# Patient Record
Sex: Female | Born: 1970
Health system: Southern US, Community
[De-identification: ages and names within clinical notes are randomized; demographics above are authoritative.]

## PROBLEM LIST (undated history)

## (undated) DIAGNOSIS — D509 Iron deficiency anemia, unspecified: Secondary | ICD-10-CM

## (undated) DIAGNOSIS — R7303 Prediabetes: Secondary | ICD-10-CM

## (undated) DIAGNOSIS — F419 Anxiety disorder, unspecified: Secondary | ICD-10-CM

## (undated) DIAGNOSIS — I639 Cerebral infarction, unspecified: Secondary | ICD-10-CM

## (undated) DIAGNOSIS — T7840XA Allergy, unspecified, initial encounter: Secondary | ICD-10-CM

## (undated) DIAGNOSIS — R519 Headache, unspecified: Secondary | ICD-10-CM

## (undated) DIAGNOSIS — K8020 Calculus of gallbladder without cholecystitis without obstruction: Secondary | ICD-10-CM

## (undated) DIAGNOSIS — J301 Allergic rhinitis due to pollen: Secondary | ICD-10-CM

## (undated) DIAGNOSIS — E785 Hyperlipidemia, unspecified: Secondary | ICD-10-CM

## (undated) HISTORY — PX: BREAST BIOPSY: SHX20

## (undated) HISTORY — DX: Cerebral infarction, unspecified: I63.9

## (undated) HISTORY — DX: Calculus of gallbladder without cholecystitis without obstruction: K80.20

## (undated) HISTORY — DX: Allergy, unspecified, initial encounter: T78.40XA

## (undated) HISTORY — PX: BRAIN SURGERY: SHX531

## (undated) HISTORY — DX: Anxiety disorder, unspecified: F41.9

## (undated) HISTORY — DX: Hyperlipidemia, unspecified: E78.5

## (undated) HISTORY — DX: Prediabetes: R73.03

## (undated) HISTORY — DX: Iron deficiency anemia, unspecified: D50.9

## (undated) HISTORY — PX: ABDOMINAL HYSTERECTOMY: SHX81

---

## 2003-04-20 ENCOUNTER — Emergency Department (HOSPITAL_COMMUNITY): Admission: EM | Admit: 2003-04-20 | Discharge: 2003-04-20 | Payer: Self-pay | Admitting: Emergency Medicine

## 2005-07-06 ENCOUNTER — Emergency Department (HOSPITAL_COMMUNITY): Admission: EM | Admit: 2005-07-06 | Discharge: 2005-07-06 | Payer: Self-pay | Admitting: Emergency Medicine

## 2008-02-26 ENCOUNTER — Emergency Department (HOSPITAL_COMMUNITY): Admission: EM | Admit: 2008-02-26 | Discharge: 2008-02-26 | Payer: Self-pay | Admitting: Emergency Medicine

## 2009-08-07 ENCOUNTER — Emergency Department (HOSPITAL_COMMUNITY): Admission: EM | Admit: 2009-08-07 | Discharge: 2009-08-07 | Payer: Self-pay | Admitting: Family Medicine

## 2010-04-15 ENCOUNTER — Other Ambulatory Visit
Admission: RE | Admit: 2010-04-15 | Discharge: 2010-04-15 | Payer: Self-pay | Source: Home / Self Care | Admitting: Obstetrics and Gynecology

## 2010-05-15 ENCOUNTER — Encounter: Admission: RE | Admit: 2010-05-15 | Discharge: 2010-05-15 | Payer: Self-pay | Admitting: Obstetrics and Gynecology

## 2010-06-03 ENCOUNTER — Encounter: Admission: RE | Admit: 2010-06-03 | Discharge: 2010-06-03 | Payer: Self-pay | Admitting: Obstetrics and Gynecology

## 2012-04-11 ENCOUNTER — Other Ambulatory Visit: Payer: Self-pay | Admitting: Obstetrics and Gynecology

## 2012-04-11 ENCOUNTER — Other Ambulatory Visit (HOSPITAL_COMMUNITY)
Admission: RE | Admit: 2012-04-11 | Discharge: 2012-04-11 | Disposition: A | Discharge status: Home / Self Care | Payer: Managed Care, Other (non HMO) | Source: Ambulatory Visit | Attending: Obstetrics and Gynecology | Admitting: Obstetrics and Gynecology

## 2012-04-11 DIAGNOSIS — Z01419 Encounter for gynecological examination (general) (routine) without abnormal findings: Secondary | ICD-10-CM | POA: Insufficient documentation

## 2012-04-11 DIAGNOSIS — N760 Acute vaginitis: Secondary | ICD-10-CM | POA: Insufficient documentation

## 2012-04-11 DIAGNOSIS — Z113 Encounter for screening for infections with a predominantly sexual mode of transmission: Secondary | ICD-10-CM | POA: Insufficient documentation

## 2012-11-02 ENCOUNTER — Other Ambulatory Visit: Payer: Self-pay | Admitting: Obstetrics and Gynecology

## 2012-11-02 ENCOUNTER — Other Ambulatory Visit (HOSPITAL_COMMUNITY)
Admission: RE | Admit: 2012-11-02 | Discharge: 2012-11-02 | Disposition: A | Discharge status: Home / Self Care | Payer: Managed Care, Other (non HMO) | Source: Ambulatory Visit | Attending: Obstetrics and Gynecology | Admitting: Obstetrics and Gynecology

## 2012-11-02 DIAGNOSIS — Z01419 Encounter for gynecological examination (general) (routine) without abnormal findings: Secondary | ICD-10-CM | POA: Insufficient documentation

## 2015-01-13 ENCOUNTER — Other Ambulatory Visit (HOSPITAL_COMMUNITY)
Admission: RE | Admit: 2015-01-13 | Discharge: 2015-01-13 | Disposition: A | Discharge status: Home / Self Care | Payer: Managed Care, Other (non HMO) | Source: Ambulatory Visit | Attending: Obstetrics and Gynecology | Admitting: Obstetrics and Gynecology

## 2015-01-13 ENCOUNTER — Other Ambulatory Visit: Payer: Self-pay

## 2015-01-13 ENCOUNTER — Other Ambulatory Visit: Payer: Self-pay | Admitting: Obstetrics and Gynecology

## 2015-01-13 DIAGNOSIS — Z01419 Encounter for gynecological examination (general) (routine) without abnormal findings: Secondary | ICD-10-CM | POA: Insufficient documentation

## 2015-01-13 DIAGNOSIS — Z1231 Encounter for screening mammogram for malignant neoplasm of breast: Secondary | ICD-10-CM

## 2015-01-13 DIAGNOSIS — Z1151 Encounter for screening for human papillomavirus (HPV): Secondary | ICD-10-CM | POA: Diagnosis present

## 2015-01-17 LAB — CYTOLOGY - PAP

## 2015-01-21 ENCOUNTER — Ambulatory Visit: Payer: Managed Care, Other (non HMO)

## 2015-01-30 ENCOUNTER — Ambulatory Visit
Admission: RE | Admit: 2015-01-30 | Discharge: 2015-01-30 | Disposition: A | Discharge status: Home / Self Care | Payer: Managed Care, Other (non HMO) | Source: Ambulatory Visit

## 2015-01-30 DIAGNOSIS — Z1231 Encounter for screening mammogram for malignant neoplasm of breast: Secondary | ICD-10-CM

## 2015-01-31 ENCOUNTER — Other Ambulatory Visit: Payer: Self-pay | Admitting: Obstetrics and Gynecology

## 2015-01-31 DIAGNOSIS — R928 Other abnormal and inconclusive findings on diagnostic imaging of breast: Secondary | ICD-10-CM

## 2015-02-04 ENCOUNTER — Ambulatory Visit
Admission: RE | Admit: 2015-02-04 | Discharge: 2015-02-04 | Disposition: A | Discharge status: Home / Self Care | Payer: Managed Care, Other (non HMO) | Source: Ambulatory Visit | Attending: Obstetrics and Gynecology | Admitting: Obstetrics and Gynecology

## 2015-02-04 DIAGNOSIS — R928 Other abnormal and inconclusive findings on diagnostic imaging of breast: Secondary | ICD-10-CM

## 2016-08-19 ENCOUNTER — Encounter (HOSPITAL_COMMUNITY): Payer: Self-pay | Admitting: Emergency Medicine

## 2016-08-19 ENCOUNTER — Emergency Department (HOSPITAL_COMMUNITY)
Admission: EM | Admit: 2016-08-19 | Discharge: 2016-08-19 | Disposition: A | Discharge status: Home / Self Care | Payer: Managed Care, Other (non HMO) | Attending: Emergency Medicine | Admitting: Emergency Medicine

## 2016-08-19 DIAGNOSIS — B9789 Other viral agents as the cause of diseases classified elsewhere: Secondary | ICD-10-CM

## 2016-08-19 DIAGNOSIS — R05 Cough: Secondary | ICD-10-CM | POA: Diagnosis present

## 2016-08-19 DIAGNOSIS — J069 Acute upper respiratory infection, unspecified: Secondary | ICD-10-CM | POA: Insufficient documentation

## 2016-08-19 MED ORDER — ALBUTEROL SULFATE HFA 108 (90 BASE) MCG/ACT IN AERS
2.0000 | INHALATION_SPRAY | Freq: Once | RESPIRATORY_TRACT | Status: AC
  Administered 2016-08-19: 2 via RESPIRATORY_TRACT
  Filled 2016-08-19: qty 6.7

## 2016-08-19 MED ORDER — BENZONATATE 100 MG PO CAPS: 100.0000 mg | ORAL_CAPSULE | Freq: Three times a day (TID) | ORAL | 0 refills | Status: DC

## 2016-08-19 MED ORDER — OSELTAMIVIR PHOSPHATE 75 MG PO CAPS
75.0000 mg | ORAL_CAPSULE | Freq: Two times a day (BID) | ORAL | 0 refills | Status: DC
Start: 2016-08-19 — End: 2019-09-22

## 2016-08-19 NOTE — ED Provider Notes (Signed)
[facility] DEPT Provider Note   CSN: [id number] Arrival date & time: 08/19/16  [id number]     History   Chief Complaint Chief Complaint  Patient presents with  . Cough  . Sore Throat    HPI Renee Hahn is a 46 y.o. female.  HPI Renee Hahn is a 46 y.o. female, otherwise healthy, presents emergency department complaining of flulike symptoms. Patient's symptoms started yesterday. She is complaining of body aches, cough, congestion, sore throat, headache. Denies any earache, no neck pain or stiffness, nausea or vomiting. Husband is sick with the same.  Subjective fever, did not take temperatue. Patient has taken Motrin for her fever. She has not taken anything else at home. Did not get flu shot this year. States nothing is making her symptoms better or worse. She has no pain at this time. States could not sleep last night so came to the emergency department.   There are no active problems to display for this patient.   History reviewed. No pertinent surgical history.  OB History    No data available       Home Medications    Prior to Admission medications   Medication Sig Start Date End Date Taking? Authorizing Provider  ibuprofen (ADVIL,MOTRIN) 200 MG tablet Take 400 mg by mouth every 6 (six) hours as needed for moderate pain.   Yes Historical Provider, MD  benzonatate (TESSALON) 100 MG capsule Take 1 capsule (100 mg total) by mouth every 8 (eight) hours. 08/19/16   Dearies Meikle, PA-C  oseltamivir (TAMIFLU) 75 MG capsule Take 1 capsule (75 mg total) by mouth every 12 (twelve) hours. 08/19/16   Jeannett Senior, PA-C    Family History Family History  Problem Relation Age of Onset  . Cancer Other     Social History Social History  Substance Use Topics  . Smoking status: Never Smoker  . Smokeless tobacco: Never Used  . Alcohol use No     Allergies   Patient has no known allergies.   Review of Systems Review of Systems  Constitutional:  Positive for chills and fever.  HENT: Positive for congestion and sore throat.   Respiratory: Positive for shortness of breath. Negative for cough and chest tightness.   Cardiovascular: Negative for chest pain, palpitations and leg swelling.  Gastrointestinal: Negative for abdominal pain, diarrhea, nausea and vomiting.  Genitourinary: Negative for dysuria, flank pain and pelvic pain.  Musculoskeletal: Positive for myalgias. Negative for arthralgias, neck pain and neck stiffness.  Skin: Negative for rash.  Neurological: Negative for dizziness, weakness and headaches.  All other systems reviewed and are negative.    Physical Exam Updated Vital Signs BP 127/88 (BP Location: Left Arm)   Pulse 108   Temp 98.4 F (36.9 C) (Oral)   Resp 20   Ht 4\' 11"  (1.499 m)   Wt 130.9 kg   LMP 08/07/2016 (Exact Date)   SpO2 96%   BMI 58.29 kg/m   Physical Exam  Constitutional: She is oriented to person, place, and time. She appears well-developed and well-nourished. No distress.  HENT:  Head: Normocephalic and atraumatic.  Right Ear: Tympanic membrane, external ear and ear canal normal.  Left Ear: Tympanic membrane, external ear and ear canal normal.  Nose: Mucosal edema and rhinorrhea present.  Mouth/Throat: Uvula is midline and mucous membranes are normal. Posterior oropharyngeal erythema present. No oropharyngeal exudate, posterior oropharyngeal edema or tonsillar abscesses.  Eyes: Conjunctivae are normal.  Neck: Neck supple.  Cardiovascular: Normal rate, regular rhythm, normal  heart sounds and intact distal pulses.   Pulmonary/Chest: Effort normal and breath sounds normal. No respiratory distress. She has no wheezes. She has no rales.  Abdominal: Soft. Bowel sounds are normal. She exhibits no distension. There is no tenderness. There is no rebound.  Musculoskeletal: Normal range of motion. She exhibits no edema.  Neurological: She is alert and oriented to person, place, and time.  Skin:  Skin is warm and dry.  Psychiatric: She has a normal mood and affect. Her behavior is normal.  Nursing note and vitals reviewed.    ED Treatments / Results  Labs (all labs ordered are listed, but only abnormal results are displayed) Labs Reviewed - No data to display  EKG  EKG Interpretation None       Radiology No results found.  Procedures Procedures (including critical care time)  Medications Ordered in ED Medications  albuterol (PROVENTIL HFA;VENTOLIN HFA) 108 (90 Base) MCG/ACT inhaler 2 puff (not administered)     Initial Impression / Assessment and Plan / ED Course  I have reviewed the triage vital signs and the nursing notes.  Pertinent labs & imaging results that were available during my care of the patient were reviewed by me and considered in my medical decision making (see chart for details).    Pt in ED with flul like symptoms. Husband sick with the same. She is afebrile here, mildly tachycardic, non toxic appearing. Lungs clear. Pt reports some wheezing earlier at home. Will give an inhaler. Home with symptomatic treatment, including tessalon, tyelnol/motrin. Pt asking about tamiflu, explained pros and cons for the tamiflu, she asked for prescription, I will provide. Return precautions discussed.    Vitals:   08/19/16 0442  BP: 127/88  Pulse: 108  Resp: 20  Temp: 98.4 F (36.9 C)  TempSrc: Oral  SpO2: 96%  Weight: 130.9 kg  Height: 4\' 11"  (1.499 m)     Final Clinical Impressions(s) / ED Diagnoses   Final diagnoses:  Viral URI with cough    New Prescriptions New Prescriptions   BENZONATATE (TESSALON) 100 MG CAPSULE    Take 1 capsule (100 mg total) by mouth every 8 (eight) hours.   OSELTAMIVIR (TAMIFLU) 75 MG CAPSULE    Take 1 capsule (75 mg total) by mouth every 12 (twelve) hours.     Jeannett Senior, PA-C 08/19/16 [place], MD 08/19/16 [phone]

## 2016-08-19 NOTE — Discharge Instructions (Signed)
Take ibuprofen/tylenol for body aches, pain, fever. Drink plenty of fluids to stay hydrated. Rest. Take inhaler 2 puffs every 4 hrs for wheezing and cough. Tessalon perles for cough. Follow up with family doctor in 3-4 days if not improving. Return if worsening. Take tamiflu if chose to.

## 2016-08-19 NOTE — ED Triage Notes (Signed)
Pt states her husband has flu like symptoms since Monday and on Wednesday she started having cough, congestion, scratchy throat, and chills  Pt denies N/V/D

## 2017-12-04 DIAGNOSIS — M25561 Pain in right knee: Secondary | ICD-10-CM | POA: Diagnosis not present

## 2019-09-20 ENCOUNTER — Other Ambulatory Visit: Payer: Self-pay

## 2019-09-20 DIAGNOSIS — D509 Iron deficiency anemia, unspecified: Secondary | ICD-10-CM | POA: Diagnosis not present

## 2019-09-20 DIAGNOSIS — K810 Acute cholecystitis: Secondary | ICD-10-CM | POA: Diagnosis not present

## 2019-09-20 DIAGNOSIS — K8012 Calculus of gallbladder with acute and chronic cholecystitis without obstruction: Principal | ICD-10-CM | POA: Insufficient documentation

## 2019-09-20 DIAGNOSIS — Z03818 Encounter for observation for suspected exposure to other biological agents ruled out: Secondary | ICD-10-CM | POA: Diagnosis not present

## 2019-09-20 DIAGNOSIS — Z20822 Contact with and (suspected) exposure to covid-19: Secondary | ICD-10-CM | POA: Diagnosis not present

## 2019-09-20 DIAGNOSIS — K819 Cholecystitis, unspecified: Secondary | ICD-10-CM | POA: Diagnosis present

## 2019-09-20 DIAGNOSIS — Z6841 Body Mass Index (BMI) 40.0 and over, adult: Secondary | ICD-10-CM | POA: Diagnosis not present

## 2019-09-20 DIAGNOSIS — K8020 Calculus of gallbladder without cholecystitis without obstruction: Secondary | ICD-10-CM | POA: Diagnosis not present

## 2019-09-21 ENCOUNTER — Encounter (HOSPITAL_COMMUNITY): Payer: Self-pay

## 2019-09-21 ENCOUNTER — Observation Stay (HOSPITAL_COMMUNITY)
Admission: EM | Admit: 2019-09-21 | Discharge: 2019-09-22 | Disposition: A | Discharge status: Home / Self Care | Payer: No Typology Code available for payment source | Attending: Surgery | Admitting: Surgery

## 2019-09-21 ENCOUNTER — Observation Stay (HOSPITAL_COMMUNITY): Payer: No Typology Code available for payment source | Admitting: Certified Registered"

## 2019-09-21 ENCOUNTER — Other Ambulatory Visit: Payer: Self-pay

## 2019-09-21 ENCOUNTER — Emergency Department (HOSPITAL_COMMUNITY): Payer: No Typology Code available for payment source

## 2019-09-21 ENCOUNTER — Encounter (HOSPITAL_COMMUNITY)
Admission: EM | Disposition: A | Discharge status: Home / Self Care | Payer: Self-pay | Source: Home / Self Care | Attending: Emergency Medicine

## 2019-09-21 DIAGNOSIS — Z03818 Encounter for observation for suspected exposure to other biological agents ruled out: Secondary | ICD-10-CM | POA: Diagnosis not present

## 2019-09-21 DIAGNOSIS — K819 Cholecystitis, unspecified: Secondary | ICD-10-CM | POA: Diagnosis present

## 2019-09-21 DIAGNOSIS — K8000 Calculus of gallbladder with acute cholecystitis without obstruction: Secondary | ICD-10-CM | POA: Diagnosis not present

## 2019-09-21 DIAGNOSIS — D509 Iron deficiency anemia, unspecified: Secondary | ICD-10-CM

## 2019-09-21 DIAGNOSIS — K8012 Calculus of gallbladder with acute and chronic cholecystitis without obstruction: Secondary | ICD-10-CM | POA: Diagnosis not present

## 2019-09-21 DIAGNOSIS — K810 Acute cholecystitis: Secondary | ICD-10-CM | POA: Diagnosis not present

## 2019-09-21 DIAGNOSIS — Z9049 Acquired absence of other specified parts of digestive tract: Secondary | ICD-10-CM | POA: Diagnosis present

## 2019-09-21 DIAGNOSIS — K8066 Calculus of gallbladder and bile duct with acute and chronic cholecystitis without obstruction: Secondary | ICD-10-CM | POA: Diagnosis not present

## 2019-09-21 DIAGNOSIS — K8020 Calculus of gallbladder without cholecystitis without obstruction: Secondary | ICD-10-CM | POA: Diagnosis not present

## 2019-09-21 HISTORY — PX: CHOLECYSTECTOMY: SHX55

## 2019-09-21 HISTORY — DX: Allergic rhinitis due to pollen: J30.1

## 2019-09-21 LAB — URINALYSIS, ROUTINE W REFLEX MICROSCOPIC
Bilirubin Urine: NEGATIVE
Glucose, UA: NEGATIVE mg/dL
Ketones, ur: NEGATIVE mg/dL
Leukocytes,Ua: NEGATIVE
Nitrite: NEGATIVE
Protein, ur: NEGATIVE mg/dL
Specific Gravity, Urine: 1.013 (ref 1.005–1.030)
pH: 8 (ref 5.0–8.0)

## 2019-09-21 LAB — RESPIRATORY PANEL BY RT PCR (FLU A&B, COVID)
Influenza A by PCR: NEGATIVE
Influenza B by PCR: NEGATIVE
SARS Coronavirus 2 by RT PCR: NEGATIVE

## 2019-09-21 LAB — COMPREHENSIVE METABOLIC PANEL
ALT: 13 U/L (ref 0–44)
AST: 35 U/L (ref 15–41)
Albumin: 3.5 g/dL (ref 3.5–5.0)
Alkaline Phosphatase: 71 U/L (ref 38–126)
Anion gap: 9 (ref 5–15)
BUN: 10 mg/dL (ref 6–20)
CO2: 27 mmol/L (ref 22–32)
Calcium: 8.8 mg/dL — ABNORMAL LOW (ref 8.9–10.3)
Chloride: 104 mmol/L (ref 98–111)
Creatinine, Ser: 0.82 mg/dL (ref 0.44–1.00)
GFR calc Af Amer: >60 mL/min (ref >60)
GFR calc non Af Amer: >60 mL/min (ref >60)
Glucose, Bld: 109 mg/dL — ABNORMAL HIGH (ref 70–99)
Potassium: 4 mmol/L (ref 3.5–5.1)
Sodium: 140 mmol/L (ref 135–145)
Total Bilirubin: 0.6 mg/dL (ref 0.3–1.2)
Total Protein: 7.5 g/dL (ref 6.5–8.1)

## 2019-09-21 LAB — CBC
HCT: 29.3 % — ABNORMAL LOW (ref 36.0–46.0)
Hemoglobin: 7.6 g/dL — ABNORMAL LOW (ref 12.0–15.0)
MCH: 15.6 pg — ABNORMAL LOW (ref 26.0–34.0)
MCHC: 25.9 g/dL — ABNORMAL LOW (ref 30.0–36.0)
MCV: 60 fL — ABNORMAL LOW (ref 80.0–100.0)
Platelets: 514 10*3/uL — ABNORMAL HIGH (ref 150–400)
RBC: 4.88 MIL/uL (ref 3.87–5.11)
RDW: 21.7 % — ABNORMAL HIGH (ref 11.5–15.5)
WBC: 15.7 10*3/uL — ABNORMAL HIGH (ref 4.0–10.5)
nRBC: 0 % (ref 0.0–0.2)

## 2019-09-21 LAB — PREGNANCY, URINE: Preg Test, Ur: NEGATIVE

## 2019-09-21 LAB — LIPASE, BLOOD: Lipase: 29 U/L (ref 11–51)

## 2019-09-21 SURGERY — LAPAROSCOPIC CHOLECYSTECTOMY WITH INTRAOPERATIVE CHOLANGIOGRAM
Anesthesia: General

## 2019-09-21 MED ORDER — OXYCODONE HCL 5 MG/5ML PO SOLN: 5.0000 mg | Freq: Once | ORAL | Status: DC | PRN

## 2019-09-21 MED ORDER — PHENYLEPHRINE 40 MCG/ML (10ML) SYRINGE FOR IV PUSH (FOR BLOOD PRESSURE SUPPORT)
PREFILLED_SYRINGE | INTRAVENOUS | Status: AC
  Filled 2019-09-21: qty 10

## 2019-09-21 MED ORDER — SODIUM CHLORIDE 0.9 % IV BOLUS
1000.0000 mL | Freq: Once | INTRAVENOUS | Status: AC
Start: 2019-09-21 — End: 2019-09-21
  Administered 2019-09-21: 02:00:00 1000 mL via INTRAVENOUS

## 2019-09-21 MED ORDER — LIDOCAINE 2% (20 MG/ML) 5 ML SYRINGE
INTRAMUSCULAR | Status: AC
  Filled 2019-09-21: qty 5

## 2019-09-21 MED ORDER — MIDAZOLAM HCL 2 MG/2ML IJ SOLN
INTRAMUSCULAR | Status: AC
  Filled 2019-09-21: qty 2

## 2019-09-21 MED ORDER — ONDANSETRON 4 MG PO TBDP
4.0000 mg | ORAL_TABLET | Freq: Once | ORAL | Status: AC | PRN
  Administered 2019-09-21: 4 mg via ORAL
  Filled 2019-09-21: qty 1

## 2019-09-21 MED ORDER — SODIUM CHLORIDE 0.9 % IV SOLN
2.0000 g | Freq: Once | INTRAVENOUS | Status: AC
  Administered 2019-09-21: 2 g via INTRAVENOUS
  Filled 2019-09-21: qty 20

## 2019-09-21 MED ORDER — ONDANSETRON HCL 4 MG/2ML IJ SOLN
INTRAMUSCULAR | Status: DC | PRN
  Administered 2019-09-21: 4 mg via INTRAVENOUS

## 2019-09-21 MED ORDER — LACTATED RINGERS IV SOLN: INTRAVENOUS | Status: DC

## 2019-09-21 MED ORDER — OXYCODONE HCL 5 MG PO TABS
5.0000 mg | ORAL_TABLET | ORAL | Status: DC | PRN
  Administered 2019-09-21: 5 mg via ORAL
  Filled 2019-09-21: qty 1

## 2019-09-21 MED ORDER — SUGAMMADEX SODIUM 500 MG/5ML IV SOLN
INTRAVENOUS | Status: DC | PRN
  Administered 2019-09-21: 250 mg via INTRAVENOUS

## 2019-09-21 MED ORDER — METOCLOPRAMIDE HCL 5 MG/ML IJ SOLN
10.0000 mg | Freq: Once | INTRAMUSCULAR | Status: AC
  Administered 2019-09-21: 10 mg via INTRAVENOUS
  Filled 2019-09-21: qty 2

## 2019-09-21 MED ORDER — ACETAMINOPHEN 325 MG PO TABS: 650.0000 mg | ORAL_TABLET | Freq: Four times a day (QID) | ORAL | Status: DC | PRN

## 2019-09-21 MED ORDER — HYDROMORPHONE HCL 1 MG/ML IJ SOLN: 1.0000 mg | INTRAMUSCULAR | Status: DC | PRN

## 2019-09-21 MED ORDER — SODIUM CHLORIDE 0.9 % IV SOLN: INTRAVENOUS | Status: DC

## 2019-09-21 MED ORDER — SODIUM CHLORIDE 0.9% FLUSH: 3.0000 mL | Freq: Once | INTRAVENOUS | Status: DC

## 2019-09-21 MED ORDER — SUCCINYLCHOLINE CHLORIDE 200 MG/10ML IV SOSY
PREFILLED_SYRINGE | INTRAVENOUS | Status: DC | PRN
  Administered 2019-09-21: 160 mg via INTRAVENOUS

## 2019-09-21 MED ORDER — FENTANYL CITRATE (PF) 100 MCG/2ML IJ SOLN
50.0000 ug | Freq: Once | INTRAMUSCULAR | Status: AC
  Administered 2019-09-21: 50 ug via INTRAVENOUS
  Filled 2019-09-21: qty 2

## 2019-09-21 MED ORDER — LIDOCAINE 2% (20 MG/ML) 5 ML SYRINGE
INTRAMUSCULAR | Status: DC | PRN
  Administered 2019-09-21: 40 mg via INTRAVENOUS
  Administered 2019-09-21: 60 mg via INTRAVENOUS

## 2019-09-21 MED ORDER — FENTANYL CITRATE (PF) 100 MCG/2ML IJ SOLN
INTRAMUSCULAR | Status: AC
  Filled 2019-09-21: qty 2

## 2019-09-21 MED ORDER — PROPOFOL 10 MG/ML IV BOLUS
INTRAVENOUS | Status: AC
  Filled 2019-09-21: qty 20

## 2019-09-21 MED ORDER — ONDANSETRON HCL 4 MG/2ML IJ SOLN: 4.0000 mg | Freq: Four times a day (QID) | INTRAMUSCULAR | Status: DC | PRN

## 2019-09-21 MED ORDER — MIDAZOLAM HCL 2 MG/2ML IJ SOLN
INTRAMUSCULAR | Status: DC | PRN
  Administered 2019-09-21: 2 mg via INTRAVENOUS

## 2019-09-21 MED ORDER — SUGAMMADEX SODIUM 500 MG/5ML IV SOLN
INTRAVENOUS | Status: AC
  Filled 2019-09-21: qty 5

## 2019-09-21 MED ORDER — FENTANYL CITRATE (PF) 100 MCG/2ML IJ SOLN: 25.0000 ug | INTRAMUSCULAR | Status: DC | PRN

## 2019-09-21 MED ORDER — ONDANSETRON HCL 4 MG/2ML IJ SOLN
INTRAMUSCULAR | Status: AC
  Filled 2019-09-21: qty 2

## 2019-09-21 MED ORDER — ACETAMINOPHEN 650 MG RE SUPP: 650.0000 mg | Freq: Four times a day (QID) | RECTAL | Status: DC | PRN

## 2019-09-21 MED ORDER — LACTATED RINGERS IV SOLN
INTRAVENOUS | Status: AC | PRN
  Administered 2019-09-21: 1000 mL

## 2019-09-21 MED ORDER — MENTHOL 3 MG MT LOZG
1.0000 | LOZENGE | OROMUCOSAL | Status: DC | PRN
  Administered 2019-09-21: 3 mg via ORAL
  Filled 2019-09-21: qty 9

## 2019-09-21 MED ORDER — BUPIVACAINE-EPINEPHRINE 0.25% -1:200000 IJ SOLN
INTRAMUSCULAR | Status: DC | PRN
  Administered 2019-09-21: 10 mL

## 2019-09-21 MED ORDER — SUCCINYLCHOLINE CHLORIDE 200 MG/10ML IV SOSY
PREFILLED_SYRINGE | INTRAVENOUS | Status: AC
  Filled 2019-09-21: qty 10

## 2019-09-21 MED ORDER — DEXAMETHASONE SODIUM PHOSPHATE 10 MG/ML IJ SOLN
INTRAMUSCULAR | Status: AC
  Filled 2019-09-21: qty 1

## 2019-09-21 MED ORDER — PROMETHAZINE HCL 25 MG/ML IJ SOLN: 6.2500 mg | INTRAMUSCULAR | Status: DC | PRN

## 2019-09-21 MED ORDER — SODIUM CHLORIDE 0.9 % IV SOLN
2.0000 g | INTRAVENOUS | Status: DC
  Administered 2019-09-21: 2 g via INTRAVENOUS
  Filled 2019-09-21 (×2): qty 20

## 2019-09-21 MED ORDER — ONDANSETRON 4 MG PO TBDP: 4.0000 mg | ORAL_TABLET | Freq: Four times a day (QID) | ORAL | Status: DC | PRN

## 2019-09-21 MED ORDER — PROPOFOL 10 MG/ML IV BOLUS
INTRAVENOUS | Status: DC | PRN
  Administered 2019-09-21: 200 mg via INTRAVENOUS

## 2019-09-21 MED ORDER — SCOPOLAMINE 1 MG/3DAYS TD PT72
1.0000 | MEDICATED_PATCH | TRANSDERMAL | Status: DC
  Administered 2019-09-21: 1.5 mg via TRANSDERMAL
  Filled 2019-09-21: qty 1

## 2019-09-21 MED ORDER — ACETAMINOPHEN 500 MG PO TABS: 1000.0000 mg | ORAL_TABLET | Freq: Once | ORAL | Status: DC

## 2019-09-21 MED ORDER — OXYCODONE HCL 5 MG PO TABS: 5.0000 mg | ORAL_TABLET | Freq: Once | ORAL | Status: DC | PRN

## 2019-09-21 MED ORDER — BUPIVACAINE HCL 0.25 % IJ SOLN
INTRAMUSCULAR | Status: AC
  Filled 2019-09-21: qty 1

## 2019-09-21 MED ORDER — FENTANYL CITRATE (PF) 100 MCG/2ML IJ SOLN
INTRAMUSCULAR | Status: DC | PRN
  Administered 2019-09-21 (×4): 50 ug via INTRAVENOUS

## 2019-09-21 MED ORDER — ROCURONIUM BROMIDE 10 MG/ML (PF) SYRINGE
PREFILLED_SYRINGE | INTRAVENOUS | Status: DC | PRN
  Administered 2019-09-21 (×2): 20 mg via INTRAVENOUS

## 2019-09-21 MED ORDER — PHENYLEPHRINE 40 MCG/ML (10ML) SYRINGE FOR IV PUSH (FOR BLOOD PRESSURE SUPPORT)
PREFILLED_SYRINGE | INTRAVENOUS | Status: DC | PRN
  Administered 2019-09-21: 120 ug via INTRAVENOUS

## 2019-09-21 MED ORDER — ENOXAPARIN SODIUM 40 MG/0.4ML ~~LOC~~ SOLN: 40.0000 mg | SUBCUTANEOUS | Status: DC

## 2019-09-21 MED ORDER — DEXAMETHASONE SODIUM PHOSPHATE 10 MG/ML IJ SOLN
INTRAMUSCULAR | Status: DC | PRN
  Administered 2019-09-21: 8 mg via INTRAVENOUS

## 2019-09-21 MED ORDER — ROCURONIUM BROMIDE 10 MG/ML (PF) SYRINGE
PREFILLED_SYRINGE | INTRAVENOUS | Status: AC
  Filled 2019-09-21: qty 10

## 2019-09-21 SURGICAL SUPPLY — 39 items
ADH SKN CLS APL DERMABOND .7 (GAUZE/BANDAGES/DRESSINGS) ×1
APL PRP STRL LF DISP 70% ISPRP (MISCELLANEOUS) ×1
APPLIER CLIP ROT 10 11.4 M/L (STAPLE) ×2
APR CLP MED LRG 11.4X10 (STAPLE) ×1
BAG SPEC RTRVL LRG 6X4 10 (ENDOMECHANICALS) ×1
CHLORAPREP W/TINT 26 (MISCELLANEOUS) ×2 IMPLANT
CLIP APPLIE ROT 10 11.4 M/L (STAPLE) ×1 IMPLANT
COVER MAYO STAND STRL (DRAPES) ×2 IMPLANT
COVER WAND RF STERILE (DRAPES) IMPLANT
DECANTER SPIKE VIAL GLASS SM (MISCELLANEOUS) ×2 IMPLANT
DERMABOND ADVANCED (GAUZE/BANDAGES/DRESSINGS) ×1
DERMABOND ADVANCED .7 DNX12 (GAUZE/BANDAGES/DRESSINGS) IMPLANT
DRAPE C-ARM 42X120 X-RAY (DRAPES) ×2 IMPLANT
DRAPE UTILITY XL STRL (DRAPES) ×2 IMPLANT
DRAPE WARM FLUID 44X44 (DRAPES) ×1 IMPLANT
ELECT REM PT RETURN 15FT ADLT (MISCELLANEOUS) ×2 IMPLANT
GLOVE INDICATOR 8.0 STRL GRN (GLOVE) ×2 IMPLANT
GLOVE SS BIOGEL STRL SZ 7.5 (GLOVE) ×1 IMPLANT
GLOVE SUPERSENSE BIOGEL SZ 7.5 (GLOVE) ×1
GOWN STRL REUS W/TWL XL LVL3 (GOWN DISPOSABLE) ×4 IMPLANT
HEMOSTAT SURGICEL 4X8 (HEMOSTASIS) IMPLANT
KIT BASIN OR (CUSTOM PROCEDURE TRAY) ×2 IMPLANT
KIT TURNOVER KIT A (KITS) IMPLANT
PENCIL SMOKE EVACUATOR (MISCELLANEOUS) IMPLANT
POUCH SPECIMEN RETRIEVAL 10MM (ENDOMECHANICALS) ×2 IMPLANT
PROTECTOR NERVE ULNAR (MISCELLANEOUS) IMPLANT
SCISSORS LAP 5X35 DISP (ENDOMECHANICALS) IMPLANT
SET CHOLANGIOGRAPH MIX (MISCELLANEOUS) ×1 IMPLANT
SET IRRIG TUBING LAPAROSCOPIC (IRRIGATION / IRRIGATOR) ×2 IMPLANT
SET TUBE SMOKE EVAC HIGH FLOW (TUBING) IMPLANT
SLEEVE XCEL OPT CAN 5 100 (ENDOMECHANICALS) ×2 IMPLANT
SUT MNCRL AB 4-0 PS2 18 (SUTURE) ×2 IMPLANT
TAPE CLOTH 4X10 WHT NS (GAUZE/BANDAGES/DRESSINGS) IMPLANT
TOWEL OR 17X26 10 PK STRL BLUE (TOWEL DISPOSABLE) ×2 IMPLANT
TOWEL OR NON WOVEN STRL DISP B (DISPOSABLE) ×2 IMPLANT
TRAY LAPAROSCOPIC (CUSTOM PROCEDURE TRAY) ×2 IMPLANT
TROCAR BLADELESS OPT 5 100 (ENDOMECHANICALS) ×2 IMPLANT
TROCAR XCEL BLUNT TIP 100MML (ENDOMECHANICALS) ×2 IMPLANT
TROCAR XCEL NON-BLD 11X100MML (ENDOMECHANICALS) ×2 IMPLANT

## 2019-09-21 NOTE — Progress Notes (Signed)
Report received from [facility] RN/ED and pt arrived to 1332 and ambulated to bed. Oriented to call bell for needs/safety and nothing to eat/drink until decision for surgery is made.

## 2019-09-21 NOTE — Transfer of Care (Signed)
Immediate Anesthesia Transfer of Care Note  Patient: Renee Hahn  Procedure(s) Performed: LAPAROSCOPIC CHOLECYSTECTOMY WITH INTRAOPERATIVE CHOLANGIOGRAM (N/A )  Patient Location: PACU  Anesthesia Type:General  Level of Consciousness: awake, alert  and oriented  Airway & Oxygen Therapy: Patient Spontanous Breathing and Patient connected to face mask oxygen  Post-op Assessment: Report given to RN and Post -op Vital signs reviewed and stable  Post vital signs: Reviewed and stable  Last Vitals:  Vitals Value Taken Time  BP    Temp    Pulse 107 09/21/19 1719  Resp 28 09/21/19 1719  SpO2 93 % 09/21/19 1719  Vitals shown include unvalidated device data.  Last Pain:  Vitals:   09/21/19 1428  TempSrc: Oral  PainSc:       Patients Stated Pain Goal: 1 ([id number])  Complications: No apparent anesthesia complications

## 2019-09-21 NOTE — Anesthesia Preprocedure Evaluation (Addendum)
Anesthesia Evaluation  Patient identified by MRN, date of birth, ID band Patient awake    Reviewed: Allergy & Precautions, NPO status , Patient's Chart, lab work & pertinent test results  History of Anesthesia Complications Negative for: history of anesthetic complications  Airway Mallampati: II  TM Distance: >3 FB Neck ROM: Full    Dental no notable dental hx.    Pulmonary neg pulmonary ROS,    Pulmonary exam normal        Cardiovascular negative cardio ROS Normal cardiovascular exam     Neuro/Psych negative neurological ROS     GI/Hepatic Neg liver ROS, acute cholecystitis/cholelithiasis   Endo/Other  Morbid obesity  Renal/GU negative Renal ROS     Musculoskeletal negative musculoskeletal ROS (+)   Abdominal   Peds  Hematology negative hematology ROS (+)   Anesthesia Other Findings Day of surgery medications reviewed with the patient.  Reproductive/Obstetrics                           Anesthesia Physical Anesthesia Plan  ASA: III  Anesthesia Plan: General   Post-op Pain Management:    Induction: Intravenous  PONV Risk Score and Plan: 4 or greater and Ondansetron, Dexamethasone, Scopolamine patch - Pre-op and Midazolam  Airway Management Planned: Oral ETT  Additional Equipment: None  Intra-op Plan:   Post-operative Plan: Extubation in OR  Informed Consent: I have reviewed the patients History and Physical, chart, labs and discussed the procedure including the risks, benefits and alternatives for the proposed anesthesia with the patient or authorized representative who has indicated his/her understanding and acceptance.     Dental advisory given  Plan Discussed with: CRNA  Anesthesia Plan Comments:       Anesthesia Quick Evaluation

## 2019-09-21 NOTE — Plan of Care (Signed)
  Problem: Pain Managment: Goal: General experience of comfort will improve Outcome: Progressing   Problem: Safety: Goal: Ability to remain free from injury will improve Outcome: Progressing   

## 2019-09-21 NOTE — ED Notes (Signed)
Pt lying in bed. Assisted up to the bathroom. Denies any needs. Will continue to monitor.

## 2019-09-21 NOTE — ED Notes (Signed)
Pt up walking to the bathroom. NAD noted. Pt denies any needs. Will continue to monitor.

## 2019-09-21 NOTE — Op Note (Signed)
Laparoscopic Cholecystectomy  Procedure Note  Indications: This patient presents with symptomatic gallbladder disease/acute cholecystitis and will undergo laparoscopic cholecystectomy.The procedure has been discussed with the patient. Operative and non operative treatments have been discussed. Risks of surgery include bleeding, infection,  Common bile duct injury,  Injury to the stomach,liver, colon,small intestine, abdominal wall,  Diaphragm,  Major blood vessels,  And the need for an open procedure.  Other risks include worsening of medical problems, death,  DVT and pulmonary embolism, and cardiovascular events.   Medical options have also been discussed. The patient has been informed of long term expectations of surgery and non surgical options,  The patient agrees to proceed.    Pre-operative Diagnosis: Calculus of gallbladder with acute cholecystitis, without mention of obstruction  Post-operative Diagnosis: Calculus of gallbladder with acute cholecystitis, without mention of obstruction  Surgeon: Turner Daniels MD   Assistants: Rise Mu   Anesthesia: General endotracheal anesthesia and Local anesthesia 0.25.% bupivacaine  ASA Class: 2  Procedure Details  The patient was seen again in the Holding Room. The risks, benefits, complications, treatment options, and expected outcomes were discussed with the patient. The possibilities of reaction to medication, pulmonary aspiration, perforation of viscus, bleeding, recurrent infection, finding a normal gallbladder, the need for additional procedures, failure to diagnose a condition, the possible need to convert to an open procedure, and creating a complication requiring transfusion or operation were discussed with the patient. The patient and/or family concurred with the proposed plan, giving informed consent. The site of surgery properly noted/marked. The patient was taken to Operating Room, identified as Renee Hahn and the procedure  verified as Laparoscopic Cholecystectomy with Intraoperative Cholangiograms. A Time Out was held and the above information confirmed.  Prior to the induction of general anesthesia, antibiotic prophylaxis was administered. General endotracheal anesthesia was then administered and tolerated well. After the induction, the abdomen was prepped in the usual sterile fashion. The patient was positioned in the supine position with the left arm comfortably tucked, along with some reverse Trendelenburg.  Local anesthetic agent was injected into the skin near the umbilicus and an incision made. The midline fascia was incised and the Hasson technique was used to introduce a 12 mm port under direct vision. It was secured with a figure of eight Vicryl suture placed in the usual fashion. Pneumoperitoneum was then created with CO2 and tolerated well without any adverse changes in the patient's vital signs. Additional trocars were introduced under direct vision with an 11 mm trocar in the epigastrium and 2 5 mm trocars in the right upper quadrant. All skin incisions were infiltrated with a local anesthetic agent before making the incision and placing the trocars.   The gallbladder was identified, the fundus grasped and retracted cephalad. Adhesions were lysed bluntly and with the electrocautery where indicated, taking care not to injure any adjacent organs or viscus. The infundibulum was grasped and retracted laterally, exposing the peritoneum overlying the triangle of Calot. This was then divided and exposed in a blunt fashion. The cystic duct was clearly identified and bluntly dissected circumferentially. The junctions of the gallbladder, cystic duct and common bile duct were clearly identified prior to the division of any linear structure.     The cystic duct was extremely small and she had no CBD dilation or abnormal LFT'S.  The cystic duct was then  ligated with surgical clips  on the patient side and  clipped on the  gallbladder side and divided. The cystic artery was identified,  dissected free, ligated with clips and divided as well. Posterior cystic artery clipped and divided.  The gallbladder was dissected from the liver bed in retrograde fashion with the electrocautery. The gallbladder was removed. The liver bed was irrigated and inspected. Hemostasis was achieved with the electrocautery. Copious irrigation was utilized and was repeatedly aspirated until clear all particulate matter. Hemostasis was achieved with no signs  Of bleeding or bile leakage.  Pneumoperitoneum was completely reduced after viewing removal of the trocars under direct vision. The wound was thoroughly irrigated and the fascia was then closed with a figure of eight suture; the skin was then closed with 4 - 0 monocryl  and a sterile dressing was applied.  Instrument, sponge, and needle counts were correct at closure and at the conclusion of the case.   Findings: Cholecystitis with Cholelithiasis  Estimated Blood Loss: Minimal         Drains: none          Total IV Fluids: per record          Specimens: Gallbladder           Complications: None; patient tolerated the procedure well.         Disposition: PACU - hemodynamically stable.         Condition: stable

## 2019-09-21 NOTE — ED Notes (Signed)
Pt lying in bed. [name] at bedside. Pt denies any needs. Monitor placed on pt. Will continue to monitor.

## 2019-09-21 NOTE — Anesthesia Procedure Notes (Signed)
Procedure Name: Intubation Date/Time: 09/21/2019 3:49 PM Performed by: Niel Hummer, CRNA Pre-anesthesia Checklist: Patient identified, Emergency Drugs available, Suction available and Patient being monitored Patient Re-evaluated:Patient Re-evaluated prior to induction Oxygen Delivery Method: Circle system utilized Preoxygenation: Pre-oxygenation with 100% oxygen Induction Type: IV induction Ventilation: Mask ventilation without difficulty Laryngoscope Size: Mac and 4 Grade View: Grade I Tube type: Oral Tube size: 7.5 mm Number of attempts: 1 Airway Equipment and Method: Stylet Placement Confirmation: ETT inserted through vocal cords under direct vision,  positive ETCO2 and breath sounds checked- equal and bilateral Secured at: 22 cm Tube secured with: Tape Dental Injury: Teeth and Oropharynx as per pre-operative assessment

## 2019-09-21 NOTE — Anesthesia Postprocedure Evaluation (Signed)
Anesthesia Post Note  Patient: Renee Hahn  Procedure(s) Performed: LAPAROSCOPIC CHOLECYSTECTOMY WITH INTRAOPERATIVE CHOLANGIOGRAM (N/A )     Patient location during evaluation: PACU Anesthesia Type: General Level of consciousness: awake and alert Pain management: pain level controlled Vital Signs Assessment: post-procedure vital signs reviewed and stable Respiratory status: spontaneous breathing, nonlabored ventilation and respiratory function stable Cardiovascular status: blood pressure returned to baseline and stable Postop Assessment: no apparent nausea or vomiting Anesthetic complications: no    Last Vitals:  Vitals:   09/21/19 1745 09/21/19 1800  BP: (!) 154/104 (!) 149/102  Pulse:  76  Resp: 16 10  Temp: 36.7 C   SpO2: 100% 100%    Last Pain:  Vitals:   09/21/19 1745  TempSrc:   PainSc: 0-No pain                 Lidia Collum

## 2019-09-21 NOTE — H&P (Signed)
[facility] Admission Note  Renee Hahn Nov 05, 1970  [id number].    Requesting MD: Antonietta Breach PA-C Chief Complaint: Abdominal pain right upper quadrant with nausea and vomiting Reason for Consult: Acute cholecystitis  HPI:  Patient is a 49 year old female who presented to the ED early this a.m. complaining of abdominal pain that she initially felt was indigestion around 11 AM yesterday..  She took a gas pill around 3 PM which did not improve her symptoms.  The pain became worse in the right upper quadrant followed by some nausea and vomiting.  She had some increased stooling but no diarrhea and presented to the ED early this a.m. because of ongoing pain.  Work-up in the ED shows she was afebrile somewhat hypertensive on admission.  Labs shows CMP essentially normal except for a glucose of 109 calcium of 8.8.  Lipase was 29 AST 35, ALT 13, total bilirubin 0.6. CBC shows white count of 15.7, hemoglobin of 7.6, hematocrit of 29.3, platelets 514,000.  Urinalysis is negative, Covid is negative.  Abdominal ultrasound shows multiple gallstones including a 2.5 cm stone at the neck of the gallbladder.  Positive Murphy sign.  CBD was 4 mm.  Findings were consistent with acute cholecystitis/cholelithiasis.  Dr. Donne Hazel was contacted and is admitted her for laparoscopic cholecystectomy.    ROS: Review of Systems  Constitutional: Negative.   HENT: Negative.   Eyes: Negative.   Respiratory: Negative.   Cardiovascular: Negative.   Gastrointestinal: Positive for abdominal pain, nausea and vomiting. Negative for blood in stool, constipation, diarrhea, heartburn and melena.  Genitourinary: Negative.   Musculoskeletal: Negative.   Skin: Negative.   Neurological: Negative.   Endo/Heme/Allergies: Negative.        She did mention that she has heavy periods and they are becoming more irregular.  Psychiatric/Behavioral: Negative.     Family History  Problem Relation Age of Onset  .  Cancer Other     Past Medical History:  Diagnosis Date  . Hay fever     History reviewed. No pertinent surgical history.  Social History:  reports that she has never smoked. She has never used smokeless tobacco. She reports current alcohol use. She reports that she does not use drugs.  Allergies: No Known Allergies  Medications Prior to Admission  Medication Sig Dispense Refill  . ibuprofen (ADVIL,MOTRIN) 200 MG tablet Take 400 mg by mouth every 6 (six) hours as needed for moderate pain.    . benzonatate (TESSALON) 100 MG capsule Take 1 capsule (100 mg total) by mouth every 8 (eight) hours. (Patient not taking: Reported on 09/21/2019) 21 capsule 0  . oseltamivir (TAMIFLU) 75 MG capsule Take 1 capsule (75 mg total) by mouth every 12 (twelve) hours. (Patient not taking: Reported on 09/21/2019) 10 capsule 0    Blood pressure 117/76, pulse 65, temperature 98.7 F (37.1 C), temperature source Oral, resp. rate 16, height 4\' 11"  (1.499 m), weight 124.7 kg, last menstrual period 09/15/2019, SpO2 100 %. Physical Exam:  General: pleasant, obese black female in no acute distress. HEENT: head is normocephalic, atraumatic.  Sclera are noninjected.  Pupils are equal.  Ears and nose without any masses or lesions.  Mouth is pink and moist Heart: regular, rate, and rhythm.  Normal s1,s2.  Aortic murmur, gallops, or rubs noted.  Palpable radial and pedal pulses bilaterally Lungs: CTAB, no wheezes, rhonchi, or rales noted.  Respiratory effort nonlabored Abd: soft, she is tender over the right upper lateral quadrant , ND, +BS, no masses,  hernias, or organomegaly MS: all 4 extremities are symmetrical with no cyanosis, clubbing, or edema. Skin: warm and dry with no masses, lesions, or rashes Neuro: Cranial nerves 2-12 grossly intact, sensation is normal throughout Psych: A&Ox3 with an appropriate affect.   Results for orders placed or performed during the hospital encounter of 09/21/19 (from the past 48  hour(s))  Urinalysis, Routine w reflex microscopic     Status: Abnormal   Collection Time: 09/21/19 12:23 AM  Result Value Ref Range   Color, Urine YELLOW YELLOW   APPearance CLEAR CLEAR   Specific Gravity, Urine 1.013 1.005 - 1.030   pH 8.0 5.0 - 8.0   Glucose, UA NEGATIVE NEGATIVE mg/dL   Hgb urine dipstick MODERATE (A) NEGATIVE   Bilirubin Urine NEGATIVE NEGATIVE   Ketones, ur NEGATIVE NEGATIVE mg/dL   Protein, ur NEGATIVE NEGATIVE mg/dL   Nitrite NEGATIVE NEGATIVE   Leukocytes,Ua NEGATIVE NEGATIVE   RBC / HPF 11-20 0 - 5 RBC/hpf   WBC, UA 0-5 0 - 5 WBC/hpf   Bacteria, UA RARE (A) NONE SEEN   Squamous Epithelial / LPF 0-5 0 - 5   Mucus PRESENT     Comment: Performed at [facility], [place] [address] [place], [place] [address]  Lipase, blood     Status: None   Collection Time: 09/21/19  2:00 AM  Result Value Ref Range   Lipase 29 11 - 51 U/L    Comment: Performed at [facility], [place] [address], [place], [place] [address]  Comprehensive metabolic panel     Status: Abnormal   Collection Time: 09/21/19  2:00 AM  Result Value Ref Range   Sodium 140 135 - 145 mmol/L   Potassium 4.0 3.5 - 5.1 mmol/L   Chloride 104 98 - 111 mmol/L   CO2 27 22 - 32 mmol/L   Glucose, Bld 109 (H) 70 - 99 mg/dL    Comment: Glucose reference range applies only to samples taken after fasting for at least 8 hours.   BUN 10 6 - 20 mg/dL   Creatinine, Ser 0.82 0.44 - 1.00 mg/dL   Calcium 8.8 (L) 8.9 - 10.3 mg/dL   Total Protein 7.5 6.5 - 8.1 g/dL   Albumin 3.5 3.5 - 5.0 g/dL   AST 35 15 - 41 U/L   ALT 13 0 - 44 U/L   Alkaline Phosphatase 71 38 - 126 U/L   Total Bilirubin 0.6 0.3 - 1.2 mg/dL   GFR calc non Af Amer >60 >60 mL/min   GFR calc Af Amer >60 >60 mL/min   Anion gap 9 5 - 15    Comment: Performed at [facility], [place] [address], [place],  [address]  CBC     Status: Abnormal   Collection Time: 09/21/19  2:00 AM  Result Value Ref  Range   WBC 15.7 (H) 4.0 - 10.5 K/uL   RBC 4.88 3.87 - 5.11 MIL/uL   Hemoglobin 7.6 (L) 12.0 - 15.0 g/dL    Comment: Reticulocyte Hemoglobin testing may be clinically indicated, consider ordering this additional test [id number]    HCT 29.3 (L) 36.0 - 46.0 %   MCV 60.0 (L) 80.0 - 100.0 fL   MCH 15.6 (L) 26.0 - 34.0 pg   MCHC 25.9 (L) 30.0 - 36.0 g/dL   RDW 21.7 (H) 11.5 - 15.5 %   Platelets 514 (H) 150 - 400 K/uL   nRBC 0.0 0.0 - 0.2 %    Comment:  Performed at [facility], [place] [address] [place], [place] [address]  Respiratory Panel by RT PCR (Flu A&B, Covid) - Urine, Clean Catch     Status: None   Collection Time: 09/21/19  2:55 AM   Specimen: Urine, Clean Catch  Result Value Ref Range   SARS Coronavirus 2 by RT PCR NEGATIVE NEGATIVE    Comment: (NOTE) SARS-CoV-2 target nucleic acids are NOT DETECTED. The SARS-CoV-2 RNA is generally detectable in upper respiratoy specimens during the acute phase of infection. The lowest concentration of SARS-CoV-2 viral copies this assay can detect is 131 copies/mL. A negative result does not preclude SARS-Cov-2 infection and should not be used as the sole basis for treatment or other patient management decisions. A negative result may occur with  improper specimen collection/handling, submission of specimen other than nasopharyngeal swab, presence of viral mutation(s) within the areas targeted by this assay, and inadequate number of viral copies (<131 copies/mL). A negative result must be combined with clinical observations, patient history, and epidemiological information. The expected result is Negative. Fact Sheet for Patients:  PinkCheek.be Fact Sheet for Healthcare Providers:  GravelBags.it This test is not yet ap proved or cleared by the [place] FDA and  has been authorized for detection and/or diagnosis of SARS-CoV-2 by FDA under an Emergency Use  Authorization (EUA). This EUA will remain  in effect (meaning this test can be used) for the duration of the COVID-19 declaration under Section 564(b)(1) of the Act, 21 U.S.C. section 360bbb-3(b)(1), unless the authorization is terminated or revoked sooner.    Influenza A by PCR NEGATIVE NEGATIVE   Influenza B by PCR NEGATIVE NEGATIVE    Comment: (NOTE) The Xpert Xpress SARS-CoV-2/FLU/RSV assay is intended as an aid in  the diagnosis of influenza from Nasopharyngeal swab specimens and  should not be used as a sole basis for treatment. Nasal washings and  aspirates are unacceptable for Xpert Xpress SARS-CoV-2/FLU/RSV  testing. Fact Sheet for Patients: PinkCheek.be Fact Sheet for Healthcare Providers: GravelBags.it This test is not yet approved or cleared by the [place] FDA and  has been authorized for detection and/or diagnosis of SARS-CoV-2 by  FDA under an Emergency Use Authorization (EUA). This EUA will remain  in effect (meaning this test can be used) for the duration of the  Covid-19 declaration under Section 564(b)(1) of the Act, 21  U.S.C. section 360bbb-3(b)(1), unless the authorization is  terminated or revoked. Performed at [facility], [place] [address], [place], [place] [address]    US Abdomen Limited  Result Date: 09/21/2019 CLINICAL DATA:  Right upper quadrant pain EXAM: ULTRASOUND ABDOMEN LIMITED RIGHT UPPER QUADRANT COMPARISON:  None. FINDINGS: Gallbladder: There are multiple gallstones, including a 2.5 cm stone at the gallbladder neck. A positive sonographic Percell Miller sign was reported by the sonographer. Common bile duct: Diameter: 4 mm Liver: Coarse, mildly hyperechoic hepatic echotexture. Portal vein is patent on color Doppler imaging with normal direction of blood flow towards the liver. Other: None. IMPRESSION: Cholelithiasis with 2.5 cm stone at the gallbladder neck and positive  sonographic Murphy sign, compatible with acute cholecystitis in the appropriate context. Electronically Signed   By: Ulyses Jarred M.D.   On: 09/21/2019 02:32      Assessment/Plan Obesity BMI 55.5 Anemia  Acute cholecystitis/cholelithiasis.  Plan: Admit, IV hydration, antibiotics, plan laparoscopic cholecystectomy later today.  Risk and benefits were discussed in detail, questions were answered.     Earnstine Regal [facility] 09/21/2019, 7:14 AM Please  see Amion for pager number during day hours 7:00am-4:30pm

## 2019-09-21 NOTE — Interval H&P Note (Signed)
History and Physical Interval Note:  09/21/2019 3:11 PM  Renee Hahn  has presented today for surgery, with the diagnosis of acute cholecystitis/cholelithiasis.  The various methods of treatment have been discussed with the patient and family. After consideration of risks, benefits and other options for treatment, the patient has consented to  Procedure(s): LAPAROSCOPIC CHOLECYSTECTOMY WITH INTRAOPERATIVE CHOLANGIOGRAM (N/A) as a surgical intervention.  The patient's history has been reviewed, patient examined, no change in status, stable for surgery.  I have reviewed the patient's chart and labs.  Questions were answered to the patient's satisfaction.     Emmet

## 2019-09-21 NOTE — Progress Notes (Signed)
Patient ID: Renee Hahn, female   DOB: 1970-10-20, 49 y.o.   MRN: [id number] with cholecystitis on [name], elevated wbc, nl lfts, will admit with abx, plan for lap chole.

## 2019-09-21 NOTE — ED Provider Notes (Addendum)
[facility] DEPT Provider Note   CSN: [id number] Arrival date & time: 09/20/19  2350     History Chief Complaint  Patient presents with  . Abdominal Pain    right abdominal pain    Renee Hahn is a 49 y.o. female.  49 y/o female presents to the emergency department for complaints of abdominal pain.  She states that she initially felt as though she was experiencing indigestion earlier in the day.  Took a gas pill and has since been experiencing worsening pain in her RUQ and R side with waxing/waning nausea.  Pain does not radiate.  She has vomited ~6-7 times today.  Emesis yellow and watery in color; nonbloody.  Patient did have some increased stooling as well, but no diarrhea, melena, hematochezia.  Denies fevers, urinary symptoms.  No hx of abdominal surgeries.  The history is provided by the patient. No language interpreter was used.  Abdominal Pain      Past Medical History:  Diagnosis Date  . Hay fever     Patient Active Problem List   Diagnosis Date Noted  . Cholecystitis 09/21/2019    History reviewed. No pertinent surgical history.   OB History   No obstetric history on file.     Family History  Problem Relation Age of Onset  . Cancer Other     Social History   Tobacco Use  . Smoking status: Never Smoker  . Smokeless tobacco: Never Used  Substance Use Topics  . Alcohol use: Yes  . Drug use: No    Home Medications Prior to Admission medications   Medication Sig Start Date End Date Taking? Authorizing Provider  ibuprofen (ADVIL,MOTRIN) 200 MG tablet Take 400 mg by mouth every 6 (six) hours as needed for moderate pain.   Yes [provider]  benzonatate (TESSALON) 100 MG capsule Take 1 capsule (100 mg total) by mouth every 8 (eight) hours. Patient not taking: Reported on 09/21/2019 08/19/16   Jeannett Senior, PA-C  oseltamivir (TAMIFLU) 75 MG capsule Take 1 capsule (75 mg total) by mouth every 12  (twelve) hours. Patient not taking: Reported on 09/21/2019 08/19/16   Jeannett Senior, PA-C    Allergies    Patient has no known allergies.  Review of Systems   Review of Systems  Gastrointestinal: Positive for abdominal pain.  Ten systems reviewed and are negative for acute change, except as noted in the HPI.    Physical Exam Updated Vital Signs BP (!) 150/93   Pulse (!) 58   Temp 98.1 F (36.7 C) (Oral)   Resp 16   Ht 4\' 11"  (1.499 m)   Wt 132 kg   LMP 09/15/2019   SpO2 100%   BMI 58.77 kg/m   Physical Exam Vitals and nursing note reviewed.  Constitutional:      General: She is not in acute distress.    Appearance: She is well-developed. She is not diaphoretic.     Comments: Nontoxic appearing and in NAD  HENT:     Head: Normocephalic and atraumatic.  Eyes:     General: No scleral icterus.    Conjunctiva/sclera: Conjunctivae normal.  Cardiovascular:     Rate and Rhythm: Normal rate and regular rhythm.     Pulses: Normal pulses.  Pulmonary:     Effort: Pulmonary effort is normal. No respiratory distress.     Comments: Respirations even and unlabored Abdominal:     Comments: TTP in the RUQ without guarding. Abdomen soft, obese. Exam  limited 2/2 habitus.  Musculoskeletal:        General: Normal range of motion.     Cervical back: Normal range of motion.  Skin:    General: Skin is warm and dry.     Coloration: Skin is not pale.     Findings: No erythema or rash.  Neurological:     General: No focal deficit present.     Mental Status: She is alert and oriented to person, place, and time.     Coordination: Coordination normal.     Comments: Ambulatory with steady gait.  Psychiatric:        Behavior: Behavior normal.     ED Results / Procedures / Treatments   Labs (all labs ordered are listed, but only abnormal results are displayed) Labs Reviewed  COMPREHENSIVE METABOLIC PANEL - Abnormal; Notable for the following components:      Result Value    Glucose, Bld 109 (*)    Calcium 8.8 (*)    All other components within normal limits  CBC - Abnormal; Notable for the following components:   WBC 15.7 (*)    Hemoglobin 7.6 (*)    HCT 29.3 (*)    MCV 60.0 (*)    MCH 15.6 (*)    MCHC 25.9 (*)    RDW 21.7 (*)    Platelets 514 (*)    All other components within normal limits  RESPIRATORY PANEL BY RT PCR (FLU A&B, COVID)  LIPASE, BLOOD  URINALYSIS, ROUTINE W REFLEX MICROSCOPIC    EKG None  Radiology US Abdomen Limited  Result Date: 09/21/2019 CLINICAL DATA:  Right upper quadrant pain EXAM: ULTRASOUND ABDOMEN LIMITED RIGHT UPPER QUADRANT COMPARISON:  None. FINDINGS: Gallbladder: There are multiple gallstones, including a 2.5 cm stone at the gallbladder neck. A positive sonographic Percell Miller sign was reported by the sonographer. Common bile duct: Diameter: 4 mm Liver: Coarse, mildly hyperechoic hepatic echotexture. Portal vein is patent on color Doppler imaging with normal direction of blood flow towards the liver. Other: None. IMPRESSION: Cholelithiasis with 2.5 cm stone at the gallbladder neck and positive sonographic Murphy sign, compatible with acute cholecystitis in the appropriate context. Electronically Signed   By: Ulyses Jarred M.D.   On: 09/21/2019 02:32    Procedures .Critical Care Performed by: Antonietta Breach, PA-C Authorized by: Antonietta Breach, PA-C   Critical care provider statement:    Critical care time (minutes):  45   Critical care was necessary to treat or prevent imminent or life-threatening deterioration of the following conditions: acute cholecystitis.   Critical care was time spent personally by me on the following activities:  Discussions with consultants, evaluation of patient's response to treatment, examination of patient, ordering and performing treatments and interventions, ordering and review of laboratory studies, ordering and review of radiographic studies, pulse oximetry, re-evaluation of patient's condition,  obtaining history from patient or surrogate and review of old charts   (including critical care time)  Medications Ordered in ED Medications  sodium chloride flush (NS) 0.9 % injection 3 mL (3 mLs Intravenous Not Given 09/21/19 0204)  cefTRIAXone (ROCEPHIN) 2 g in sodium chloride 0.9 % 100 mL IVPB (2 g Intravenous New Bag/Given (Non-Interop) 09/21/19 0316)  ondansetron (ZOFRAN-ODT) disintegrating tablet 4 mg (4 mg Oral Given 09/21/19 0027)  metoCLOPramide (REGLAN) injection 10 mg (10 mg Intravenous Given 09/21/19 0203)  fentaNYL (SUBLIMAZE) injection 50 mcg (50 mcg Intravenous Given 09/21/19 0203)  sodium chloride 0.9 % bolus 1,000 mL (1,000 mLs Intravenous New Bag/Given (Non-Interop) 09/21/19  0204)    ED Course  I have reviewed the triage vital signs and the nursing notes.  Pertinent labs & imaging results that were available during my care of the patient were reviewed by me and considered in my medical decision making (see chart for details).  Clinical Course as of Sep 20 328  Fri Sep 21, 2019  0256 Patient with findings suggestive of acute cholecystitis on [name]. She has a leukocytosis, but preserved LFTs. Started of IV abx. Consult placed to [facility].  Of note, the patient is anemic with a hemoglobin of 7.6.  She is currently on her menstrual cycle.  This anemia is microcytic and favored to be secondary to iron deficiency as she is not experiencing tachycardia or hypotension to suggest acute blood loss.   [KH]  [phone] Case discussed with Dr. Donne Hazel of general surgery.  He will place orders for admission.  Plan for formal consultation later this morning to discuss operative management with the patient.   [id number] discussed with patient.  While she is hesitant about plan for admission, currently amenable.  Notified that if she chose to leave the ED and not be admitted she would be making this decision [place].  She verbalizes understanding.   [KH]    Clinical Course User  Index [KH] Beverely Pace   MDM Rules/Calculators/A&P                      49 year old female presents to the emergency department for right upper quadrant abdominal pain.  Has an ultrasound consistent with acute cholecystitis.  This correlates with a leukocytosis of 15.7.  The patient has been hemodynamically stable since arrival.  Pain and nausea have improved with fentanyl and Reglan.  Started on IV Rocephin with plans for admission to the general surgery service.   Final Clinical Impression(s) / ED Diagnoses Final diagnoses:  Acute cholecystitis  Microcytic anemia    Rx / DC Orders ED Discharge Orders    None       Antonietta Breach, PA-C 09/21/19 [place], PA-C 09/21/19 [place], April, MD 09/21/19 [phone]

## 2019-09-21 NOTE — Discharge Instructions (Signed)
[facility] ______[facility] °LAPAROSCOPIC SURGERY: POST OP INSTRUCTIONS °Always review your discharge instruction sheet given to you by the facility where your surgery was performed. °IF YOU HAVE DISABILITY OR FAMILY LEAVE FORMS, YOU MUST BRING THEM TO THE OFFICE FOR PROCESSING.   °DO NOT GIVE THEM TO YOUR DOCTOR. ° °1. A prescription for pain medication may be given to you upon discharge.  Take your pain medication as prescribed, if needed.  If narcotic pain medicine is not needed, then you may take acetaminophen (Tylenol) or ibuprofen (Advil) as needed. °2. Take your usually prescribed medications unless otherwise directed. °3. If you need a refill on your pain medication, please contact your pharmacy.  They will contact our office to request authorization. Prescriptions will not be filled after 5pm or on week-ends. °4. You should follow a light diet the first few days after arrival home, such as soup and crackers, etc.  Be sure to include lots of fluids daily. °5. Most patients will experience some swelling and bruising in the area of the incisions.  Ice packs will help.  Swelling and bruising can take several days to resolve.  °6. It is common to experience some constipation if taking pain medication after surgery.  Increasing fluid intake and taking a stool softener (such as Colace) will usually help or prevent this problem from occurring.  A mild laxative (Milk of Magnesia or Miralax) should be taken according to package instructions if there are no bowel movements after 48 hours. °7. Unless discharge instructions indicate otherwise, you may remove your bandages 24-48 hours after surgery, and you may shower at that time.  You may have steri-strips (small skin tapes) in place directly over the incision.  These strips should be left on the skin for 7-10 days.  If your surgeon used skin glue on the incision, you may shower in 24 hours.  The glue will flake off over the next 2-3 weeks.  Any sutures or  staples will be removed at the office during your follow-up visit. °8. ACTIVITIES:  You may resume regular (light) daily activities beginning the next day--such as daily self-care, walking, climbing stairs--gradually increasing activities as tolerated.  You may have sexual intercourse when it is comfortable.  Refrain from any heavy lifting or straining until approved by your doctor. °a. You may drive when you are no longer taking prescription pain medication, you can comfortably wear a seatbelt, and you can safely maneuver your car and apply brakes. °b. RETURN TO WORK:  __________________________________________________________ °9. You should see your doctor in the office for a follow-up appointment approximately 2-3 weeks after your surgery.  Make sure that you call for this appointment within a day or two after you arrive home to insure a convenient appointment time. °10. OTHER INSTRUCTIONS: __________________________________________________________________________________________________________________________ __________________________________________________________________________________________________________________________ °WHEN TO CALL YOUR DOCTOR: °1. Fever over 101.0 °2. Inability to urinate °3. Continued bleeding from incision. °4. Increased pain, redness, or drainage from the incision. °5. Increasing abdominal pain ° °The clinic staff is available to answer your questions during regular business hours.  Please don’t hesitate to call and ask to speak to one of the nurses for clinical concerns.  If you have a medical emergency, go to the nearest emergency room or call 911.  A surgeon from [facility] is always on call at the hospital. °[address], [address], [place], [place]  [address] ? [address] [address], [place], [place]   [address] °[phone] ? [phone] ? FAX [phone] °Web site:   [email or web address] °

## 2019-09-22 MED ORDER — OXYCODONE HCL 5 MG PO TABS: 5.0000 mg | ORAL_TABLET | Freq: Four times a day (QID) | ORAL | 0 refills | Status: DC | PRN

## 2019-09-22 NOTE — Progress Notes (Signed)
Patient diischarged home via wheelchair to sister's vehicle without incident.  Skin warm and dry.  Alert and oriented.

## 2019-09-22 NOTE — Discharge Summary (Signed)
Physician Discharge Summary  Patient ID: Renee Hahn MRN: [id number] DOB/AGE: 1971/06/27 49 y.o.  Admit date: 09/21/2019 Discharge date: 09/22/2019  Admission Diagnoses:  Acute cholecystitis  Discharge Diagnoses: Same Active Problems:   Cholecystitis   Discharged Condition: good  Hospital Course: Presented with acute cholecystitis.  Underwent lap chole by Dr. Brantley Stage on 3/19.  Felt much better.  Ready for discharge.  Treatments: surgery: Lap chole 09/21/19  Discharge Exam: Blood pressure 125/65, pulse 73, temperature 99 F (37.2 C), temperature source Oral, resp. rate 16, height 4\' 11"  (1.499 m), weight 124.7 kg, last menstrual period 09/15/2019, SpO2 99 %. General appearance: alert, cooperative and no distress GI: soft, minimally tender around incisions Incisions c/d/i  Disposition: Discharge disposition: 01-Home or Self Care       Discharge Instructions    Call MD for:  persistant nausea and vomiting   Complete by: As directed    Call MD for:  redness, tenderness, or signs of infection (pain, swelling, redness, odor or green/yellow discharge around incision site)   Complete by: As directed    Call MD for:  severe uncontrolled pain   Complete by: As directed    Call MD for:  temperature >100.4   Complete by: As directed    Diet general   Complete by: As directed    Driving Restrictions   Complete by: As directed    Do not drive while taking pain medications   Increase activity slowly   Complete by: As directed    May shower / Bathe   Complete by: As directed      Allergies as of 09/22/2019   No Known Allergies     Medication List    STOP taking these medications   benzonatate 100 MG capsule Commonly known as: TESSALON   oseltamivir 75 MG capsule Commonly known as: TAMIFLU     TAKE these medications   ibuprofen 200 MG tablet Commonly known as: ADVIL Take 400 mg by mouth every 6 (six) hours as needed for moderate pain.   oxyCODONE 5 MG  immediate release tablet Commonly known as: Oxy IR/ROXICODONE Take 1 tablet (5 mg total) by mouth every 6 (six) hours as needed for severe pain.      Follow-up Information    [facility] [place] Follow up on 10/16/2019.   Specialty: General Surgery Why: Your appointment is at 8:45 AM.  Be at the office 30 minutes early for check-in.  Bring photo ID and insurance information. Contact information: [address]  [place] [address] [phone]           Signed: Maia Petties 09/22/2019, 8:35 AM

## 2019-09-24 LAB — SURGICAL PATHOLOGY

## 2019-12-17 ENCOUNTER — Encounter: Payer: Self-pay | Admitting: Nurse Practitioner

## 2019-12-17 ENCOUNTER — Telehealth: Payer: Self-pay | Admitting: Physician Assistant

## 2019-12-17 ENCOUNTER — Other Ambulatory Visit: Payer: Self-pay | Admitting: Physician Assistant

## 2019-12-17 ENCOUNTER — Ambulatory Visit (INDEPENDENT_AMBULATORY_CARE_PROVIDER_SITE_OTHER): Payer: No Typology Code available for payment source | Admitting: Nurse Practitioner

## 2019-12-17 ENCOUNTER — Other Ambulatory Visit: Payer: Self-pay

## 2019-12-17 VITALS — BP 122/82 | HR 79 | Temp 97.0°F | Ht 59.0 in | Wt 272.8 lb

## 2019-12-17 DIAGNOSIS — D500 Iron deficiency anemia secondary to blood loss (chronic): Secondary | ICD-10-CM

## 2019-12-17 DIAGNOSIS — L219 Seborrheic dermatitis, unspecified: Secondary | ICD-10-CM

## 2019-12-17 LAB — CBC WITH DIFFERENTIAL/PLATELET
Basophils Absolute: 0 10*3/uL (ref 0.0–0.1)
Basophils Relative: 0.4 % (ref 0.0–3.0)
Eosinophils Absolute: 0.1 10*3/uL (ref 0.0–0.7)
Eosinophils Relative: 1.3 % (ref 0.0–5.0)
HCT: 23.6 % — CL (ref 36.0–46.0)
Hemoglobin: 7 g/dL — CL (ref 12.0–15.0)
Lymphocytes Relative: 23.6 % (ref 12.0–46.0)
Lymphs Abs: 2.4 10*3/uL (ref 0.7–4.0)
MCHC: 29.4 g/dL — ABNORMAL LOW (ref 30.0–36.0)
MCV: 53.7 fl — ABNORMAL LOW (ref 78.0–100.0)
Monocytes Absolute: 0.5 10*3/uL (ref 0.1–1.0)
Monocytes Relative: 4.5 % (ref 3.0–12.0)
Neutro Abs: 7.2 10*3/uL (ref 1.4–7.7)
Neutrophils Relative %: 70.2 % (ref 43.0–77.0)
Platelets: 343 10*3/uL (ref 150.0–400.0)
RBC: 4.4 Mil/uL (ref 3.87–5.11)
RDW: 20.4 % — ABNORMAL HIGH (ref 11.5–15.5)
WBC: 10.2 10*3/uL (ref 4.0–10.5)

## 2019-12-17 LAB — IRON,TIBC AND FERRITIN PANEL
%SAT: 3 % (calc) — ABNORMAL LOW (ref 16–45)
Ferritin: 3 ng/mL — ABNORMAL LOW (ref 16–232)
Iron: 12 ug/dL — ABNORMAL LOW (ref 40–190)
TIBC: 407 mcg/dL (calc) (ref 250–450)

## 2019-12-17 MED ORDER — KETOCONAZOLE 2 % EX SHAM: 1.0000 "application " | MEDICATED_SHAMPOO | CUTANEOUS | 2 refills | Status: DC

## 2019-12-17 MED ORDER — FERROUS GLUCONATE 324 (38 FE) MG PO TABS: 324.0000 mg | ORAL_TABLET | Freq: Three times a day (TID) | ORAL | 3 refills | Status: DC

## 2019-12-17 MED ORDER — CLOTRIMAZOLE-BETAMETHASONE 1-0.05 % EX CREA: 1.0000 "application " | TOPICAL_CREAM | Freq: Two times a day (BID) | CUTANEOUS | 2 refills | Status: DC

## 2019-12-17 NOTE — Telephone Encounter (Signed)
Received an urgent referral from [facility] at [facility] for anemia, hgb 7.0. Pt has been cld and scheduled to see Cassie on 6/15 at 1:30pm. Pt aware to arrive 15 minutes early.

## 2019-12-17 NOTE — Progress Notes (Signed)
Subjective:  Patient ID: Renee Hahn, female    DOB: Jan 24, 1971  Age: 49 y.o. MRN: [id number]  CC: Establish Care (New pt-rash on back of neck-comes and goes but worse when it gets hot and irrated/dry and itchy//pt tried atheletes foot and neosporin and helped but still there//no Covid vaccine an no recent tetauns)  Rash This is a chronic problem. The current episode started more than 1 year ago. The problem has been waxing and waning since onset. The affected locations include the scalp and neck. The rash is characterized by itchiness and scaling. She was exposed to nothing. Pertinent negatives include no joint pain or nail changes. Past treatments include antibiotic cream and moisturizer. The treatment provided mild relief. Her past medical history is significant for eczema.   Also reports hx of anemia per lab done prior to cholecystectomy 09/2019, does not take any OTC iron supplement. Has intermittent fatigue and dizziness.  Reviewed past Medical, Social and Family history today.  Outpatient Medications Prior to Visit  Medication Sig Dispense Refill  . ibuprofen (ADVIL,MOTRIN) 200 MG tablet Take 400 mg by mouth every 6 (six) hours as needed for moderate pain.    Marland Kitchen oxyCODONE (OXY IR/ROXICODONE) 5 MG immediate release tablet Take 1 tablet (5 mg total) by mouth every 6 (six) hours as needed for severe pain. (Patient not taking: Reported on 12/17/2019) 16 tablet 0   No facility-administered medications prior to visit.    ROS See HPI  Objective:  BP 122/82   Pulse 79   Temp (!) 97 F (36.1 C) (Tympanic)   Ht 4\' 11"  (1.499 m)   Wt 272 lb 12.8 oz (123.7 kg)   SpO2 99%   BMI 55.10 kg/m   BP Readings from Last 3 Encounters:  12/17/19 122/82  09/22/19 125/65  08/19/16 127/88    Wt Readings from Last 3 Encounters:  12/17/19 272 lb 12.8 oz (123.7 kg)  09/21/19 274 lb 14.6 oz (124.7 kg)  08/19/16 288 lb 9.6 oz (130.9 kg)    Physical Exam Constitutional:      Appearance:  She is obese.  Skin:    Findings: Rash present. Rash is macular.          Comments: Flaky scalp  Neurological:     Mental Status: She is alert and oriented to person, place, and time.  Psychiatric:        Mood and Affect: Mood normal.        Behavior: Behavior normal.        Thought Content: Thought content normal.     Lab Results  Component Value Date   WBC 10.2 12/17/2019   HGB 7.0 Repeated and verified X2. (LL) 12/17/2019   HCT 23.6 Repeated and verified X2. (LL) 12/17/2019   PLT 343.0 12/17/2019   GLUCOSE 109 (H) 09/21/2019   ALT 13 09/21/2019   AST 35 09/21/2019   NA 140 09/21/2019   K 4.0 09/21/2019   CL 104 09/21/2019   CREATININE 0.82 09/21/2019   BUN 10 09/21/2019   CO2 27 09/21/2019    Assessment & Plan:  This visit occurred during the SARS-CoV-2 public health emergency.  Safety protocols were in place, including screening questions prior to the visit, additional usage of staff PPE, and extensive cleaning of exam room while observing appropriate contact time as indicated for disinfecting solutions.   Renee Hahn was seen today for establish care.  Diagnoses and all orders for this visit:  Seborrhea -     ketoconazole (NIZORAL)  2 % shampoo; Apply 1 application topically 2 (two) times a week. -     clotrimazole-betamethasone (LOTRISONE) cream; Apply 1 application topically 2 (two) times daily.  Iron deficiency anemia due to chronic blood loss -     CBC w/Diff -     Iron, TIBC and Ferritin Panel -     ferrous gluconate (FERGON) 324 MG tablet; Take 1 tablet (324 mg total) by mouth 3 (three) times daily with meals. -     Ambulatory referral to Hematology   I am having Renee Hahn start on ketoconazole, clotrimazole-betamethasone, and ferrous gluconate. I am also having her maintain her ibuprofen and oxyCODONE.  Meds ordered this encounter  Medications  . ketoconazole (NIZORAL) 2 % shampoo    Sig: Apply 1 application topically 2 (two) times a week.     Dispense:  120 mL    Refill:  2    Order Specific Question:   Supervising Provider    Answer:   Ronnald Nian [[id number]]  . clotrimazole-betamethasone (LOTRISONE) cream    Sig: Apply 1 application topically 2 (two) times daily.    Dispense:  45 g    Refill:  2    Order Specific Question:   Supervising Provider    Answer:   Ronnald Nian [[id number]]  . ferrous gluconate (FERGON) 324 MG tablet    Sig: Take 1 tablet (324 mg total) by mouth 3 (three) times daily with meals.    Dispense:  90 tablet    Refill:  3    Order Specific Question:   Supervising Provider    Answer:   Ronnald Nian [[id number]]    Problem List Items Addressed This Visit      Musculoskeletal and Integument   Seborrhea - Primary   Relevant Medications   ketoconazole (NIZORAL) 2 % shampoo   clotrimazole-betamethasone (LOTRISONE) cream     Other   Iron deficiency anemia due to chronic blood loss   Relevant Medications   ferrous gluconate (FERGON) 324 MG tablet   Other Relevant Orders   CBC w/Diff (Completed)   Iron, TIBC and Ferritin Panel   Ambulatory referral to Hematology      Follow-up: Return in about 4 weeks (around 01/14/2020) for CPE (fasting).  Wilfred Lacy, NP

## 2019-12-17 NOTE — Patient Instructions (Addendum)
Severe anemia Entered urgent referral to hematology Start ferrous sulfate 1tab TID with food. New rx sent. Maintain adquate oral hydration Go to ED if you develop SOB, palpitations, chest pain or syncope/near syncope.   Seborrheic Dermatitis  What is seborrheic dermatitis? Seborrheic (say: seb-oh-ree-ick) dermatitis is a disease that causes flaking of the skin.  It usually affects the scalp.  In teenagers and adults, it is commonly called "dandruff".  In infants, it is referred to as "cradle cap".  Dandruff often appears as scaling on the scalp with or without redness.  On other parts of the body, seborrheic dermatitis tends to produce both redness and scaling.  Other common locations of seborrheic dermatitis include the central face, eyebrows, chest, and the creases of the arms, legs, and groin.  It often causes the skin to look a little greasy, scaly, or flaky. Seborrheic dermatitis can occur at any age.  It often comes and goes and may to be seasonally related, especially in the Northern climates.  What causes seborrheic dermatitis? The exact cause is not known, though yeast of the Malassezia species may be involved.  This organism is normally present on the skin in small numbers, but sometimes its numbers increase, especially in oily skin.  Treatments that reduce the yeast tend to improve seborrheic dermatitis.  How is seborrheic dermatitis treated? The treatment of seborrheic dermatitis depends on its location on the body and the person's age. Seborrheic dermatitis of the scalp (dandruff) in adults and teenagers is usually treated with a medicated shampoo.  Here is a list of the medications that help, and the over-counter shampoos that contain them:  Salicylic acid (Neutrogena T/Sal, Sebulex, Scalpicin, Denorex Extra Strength)  Zinc pyrithione (Head & Shoulders white bottle, Denorex Daily, DHS Zinc, Pantene Pro-V Pyrithione Zinc)  Selenium sulfide (Head & Shoulders blue bottle, Selsun  Blue, Exsel Lotion Shampoo, Glo-Sel)  Yahoo tar (Neutrogena T/Gal, Pentrax, Zetar, Tegrin, Viacom, Therapeutic Denorex)  Ketoconazole (Nizoral)  If you have dandruff, you might start by using one of these shampoos every day until your dandruff is controlled and then keep using it at least twice a week.  Often times your doctor will recommend a rotation of several different medicated shampoos as some will experience a plateau in the effectiveness of any one shampoo.   When you use a dandruff shampoo, rub the shampoo into your wet hair and massage into scalp thoroughly.  Let it stay on your hair and scalp for 5 minutes before rinsing.  If you have involvement in the eyebrows or face, you can lather those areas with the medicated shampoo as well, or use a medicated soap (ZNP-bar, Polytar Soap, SAStid, or sulfur soap).    If the wash or shampoo alone does not help, your doctor might want you to use a prescription medication once or twice a day.  Leave-in medications for the scalp are best applied by massaging into the scalp immediately after towel drying your hair, but may be applied even if you have not washed your hair.  Seborrheic dermatitis in infants usually clears up by age 85 -25 months.  It may develop in the diaper area where it might be confused with diaper rash.  For milder cases you can try gently brushing out scales with a soft brush.  This is best done immediately after washing with a non-medicated baby shampoo Wynetta Emery and Royce Macadamia, etc.).  Your doctor may recommend a medicated shampoo or a prescription topical medication.

## 2019-12-18 ENCOUNTER — Inpatient Hospital Stay: Payer: No Typology Code available for payment source

## 2019-12-18 ENCOUNTER — Other Ambulatory Visit: Payer: Self-pay | Admitting: Physician Assistant

## 2019-12-18 ENCOUNTER — Other Ambulatory Visit: Payer: Self-pay

## 2019-12-18 ENCOUNTER — Inpatient Hospital Stay: Payer: No Typology Code available for payment source | Attending: Physician Assistant | Admitting: Physician Assistant

## 2019-12-18 VITALS — BP 135/84 | HR 84 | Temp 97.8°F | Resp 20 | Ht 59.0 in | Wt 271.2 lb

## 2019-12-18 DIAGNOSIS — D500 Iron deficiency anemia secondary to blood loss (chronic): Secondary | ICD-10-CM

## 2019-12-18 DIAGNOSIS — N920 Excessive and frequent menstruation with regular cycle: Secondary | ICD-10-CM | POA: Diagnosis not present

## 2019-12-18 DIAGNOSIS — D509 Iron deficiency anemia, unspecified: Secondary | ICD-10-CM | POA: Insufficient documentation

## 2019-12-18 HISTORY — DX: Excessive and frequent menstruation with regular cycle: N92.0

## 2019-12-18 LAB — CBC WITH DIFFERENTIAL (CANCER CENTER ONLY)
Abs Immature Granulocytes: 0.03 10*3/uL (ref 0.00–0.07)
Basophils Absolute: 0 10*3/uL (ref 0.0–0.1)
Basophils Relative: 0 %
Eosinophils Absolute: 0.1 10*3/uL (ref 0.0–0.5)
Eosinophils Relative: 1 %
HCT: 27.4 % — ABNORMAL LOW (ref 36.0–46.0)
Hemoglobin: 7 g/dL — ABNORMAL LOW (ref 12.0–15.0)
Immature Granulocytes: 0 %
Lymphocytes Relative: 24 %
Lymphs Abs: 2.5 10*3/uL (ref 0.7–4.0)
MCH: 15.2 pg — ABNORMAL LOW (ref 26.0–34.0)
MCHC: 25.5 g/dL — ABNORMAL LOW (ref 30.0–36.0)
MCV: 59.3 fL — ABNORMAL LOW (ref 80.0–100.0)
Monocytes Absolute: 0.5 10*3/uL (ref 0.1–1.0)
Monocytes Relative: 5 %
Neutro Abs: 7.3 10*3/uL (ref 1.7–7.7)
Neutrophils Relative %: 70 %
Platelet Count: 418 10*3/uL — ABNORMAL HIGH (ref 150–400)
RBC: 4.62 MIL/uL (ref 3.87–5.11)
RDW: 21.7 % — ABNORMAL HIGH (ref 11.5–15.5)
WBC Count: 10.4 10*3/uL (ref 4.0–10.5)
nRBC: 0 % (ref 0.0–0.2)

## 2019-12-18 LAB — CMP (CANCER CENTER ONLY)
ALT: 15 U/L (ref 0–44)
AST: 66 U/L — ABNORMAL HIGH (ref 15–41)
Albumin: 3.4 g/dL — ABNORMAL LOW (ref 3.5–5.0)
Alkaline Phosphatase: 72 U/L (ref 38–126)
Anion gap: 11 (ref 5–15)
BUN: 9 mg/dL (ref 6–20)
CO2: 27 mmol/L (ref 22–32)
Calcium: 9.1 mg/dL (ref 8.9–10.3)
Chloride: 107 mmol/L (ref 98–111)
Creatinine: 0.82 mg/dL (ref 0.44–1.00)
GFR, Est AFR Am: >60 mL/min (ref >60)
GFR, Estimated: >60 mL/min (ref >60)
Glucose, Bld: 89 mg/dL (ref 70–99)
Potassium: 3.7 mmol/L (ref 3.5–5.1)
Sodium: 145 mmol/L (ref 135–145)
Total Bilirubin: 0.6 mg/dL (ref 0.3–1.2)
Total Protein: 7.5 g/dL (ref 6.5–8.1)

## 2019-12-18 NOTE — Progress Notes (Signed)
[facility] [phone]) [phone]   [phone]  CONSULT NOTE  REFERRING PHYSICIAN: Wilfred Lacy NP  REASON FOR CONSULTATION:  Iron deficiency anemia likely secondary to menorrhagia   HPI Renee Hahn is a 49 y.o. female without any significant past medical history except for intermittent periods of iron deficiency since her 31s, cholecystitis status post cholecystectomy in 09/21/19, and menorrhagia is referred to the clinic for evaluation of iron deficiency anemia.  The patient was referred to the clinic after she presented to her primary care provider's office yesterday to establish care.  Blood work performed at this visit was remarkable for microcytic anemia with a hemoglobin at 7.0, MCV at 53.7, RDW elevated at 20.4.  The patient's WBCs and platelets were within normal limits.  She had iron studies performed which noted low iron at 12, iron saturation low at 3%, and ferritin low at 3.  The patient's primary care provider started her on ferrous gluconate 325 mg daily.  The patient had her first dose last night and seemed to tolerate it fairly well thus far. The only other CBC that was available in chart review was performed in the emergency room on 09/21/2019 when the patient presented with cholecystitis.  She is status post a cholecystectomy.  It does not appear that her anemia was addressed during this hospitalization.   To the patient's knowledge, she has never had anemia but reportedly had iron deficiency intermittently since she was in her 24s.  The patient states that in 2016 she was taking iron supplements for a brief period of time for her iron deficiency and she was undergoing a work-up by her OB/GYN at [facility] for menorrhagia.  The patient states that she was taking an over-the-counter iron supplement at that time.  She does not know the milligram dosage.  She states that it "upset her stomach".  She also notes that she stopped taking it because she is  going through a challenging time in her marriage. In 2016, the patient had been having menstrual periods lasting for 1 month.  Per patient report, she had multiple studies performed to further evaluate her menorrhagia.  Per patient description, it sounds like it was recommended that the patient undergo a uterine ablation at that time.  The patient did not want to move forward with this procedure and has not followed up with her OB/GYN since about 2018 or so.  The patient has never required a blood transfusion or an iron transfusion  The patient states that her menstrual cycles occur approximately once a month and lasts somewhere between 7 and 14 days.  The patient notes that her menstrual cycles produce thick clots of blood.  Patient states that she goes through 1 to 1-1/2 packs of heavy menstrual pads every cycle.  She estimates that she uses 3-4 heavy flow pads per day.  She currently is menstruating and her most recent cycle started on 12/16/2019.   The patient denies any other abnormal bleeding or bruising including epistaxis, gingival bleeding, hemoptysis, hematemesis, melena, hematochezia, or hematuria.  She states that she craves ice.  She also reports drinking a lot of sweet tea.  She denies recent ibuprofen use/NSAID use.  She states that she had been taking about 2-3 200 mg ibuprofens/day 3x a month a few months ago prior to her cholecystectomy for abdominal discomfort but has not taken any since that time due to improvement in her symptoms.  She denies any current abdominal pain, nausea, vomiting, diarrhea, or  constipation.  She denies any prior abdominal surgeries or bariatric surgeries.  She denies any particular dietary habits such as being a vegan or vegetarian.  She denies any other vitamin deficiencies besides iron deficiency.  The patient has never had a colonoscopy due to her age.   The patient reports some fatigue as well as decreased exercise tolerance.  She denies any significant  lightheadedness, shortness of breath, chest pain, or palpitations.  The patient notes that she has lost weight recently.  She states that she has went down 3 dress sizes.  She attributes this to reduced stress after the passing of her spouse. She mentioned that her marriage was very stressful for her.  Patient denies any family history of sickle cell anemia, thalassemia, colorectal cancer/GI cancer, or hematologic malignancies.  The patient's mother had breast cancer as well as menorrhagia.  The patient's father is currently undergoing treatment for prostate cancer as well as possible lung cancer.  The patient has 1 brother who reportedly does not have any diagnosed medical conditions; however, she does indicate that her brother does not routinely seek medical care.  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  She is widowed, and her husband recently passed away in August 27, 2019.  The patient has 1 stepson.  She denies any drug use or tobacco use.  He states that she has "a few drinks" every few months denies any regular alcohol use. HPI  Past Medical History:  Diagnosis Date  . Hay fever     Past Surgical History:  Procedure Laterality Date  . CHOLECYSTECTOMY N/A 09/21/2019   Procedure: LAPAROSCOPIC CHOLECYSTECTOMY WITH INTRAOPERATIVE CHOLANGIOGRAM;  Surgeon: Erroll Luna, MD;  Location: [facility];  Service: General;  Laterality: N/A;    Family History  Problem Relation Age of Onset  . Cancer Other   . Cancer Mother        breast  . Cancer Father        protaste  . Hypertension Maternal Grandmother   . Hypertension Paternal Grandfather     Social History Social History   Tobacco Use  . Smoking status: Never Smoker  . Smokeless tobacco: Never Used  Substance Use Topics  . Alcohol use: Yes    Comment: socially  . Drug use: No    No Known Allergies  Current Outpatient Medications  Medication Sig Dispense Refill  . clotrimazole-betamethasone (LOTRISONE)  cream Apply 1 application topically 2 (two) times daily. 45 g 2  . ferrous gluconate (FERGON) 324 MG tablet Take 1 tablet (324 mg total) by mouth 3 (three) times daily with meals. 90 tablet 3  . ibuprofen (ADVIL,MOTRIN) 200 MG tablet Take 400 mg by mouth every 6 (six) hours as needed for moderate pain.    Marland Kitchen ketoconazole (NIZORAL) 2 % shampoo Apply 1 application topically 2 (two) times a week. 120 mL 2   No current facility-administered medications for this visit.    Review of Systems REVIEW OF SYSTEMS:   Review of Systems  Constitutional: Positive for fatigue and decreased exercise tolerance. negative for appetite change, chills,fever and unexpected weight change.  HENT: Negative for mouth sores, nosebleeds, sore throat and trouble swallowing.   Eyes: Negative for eye problems and icterus.  Respiratory: Negative for cough, hemoptysis, shortness of breath and wheezing.   Cardiovascular: Negative for chest pain and leg swelling.  Gastrointestinal: Negative for abdominal pain, melena, hematochezia, constipation, diarrhea, nausea and vomiting.  Genitourinary: Positive for menorrhagia. negative for bladder incontinence, difficulty urinating, dysuria, frequency  and hematuria.   Musculoskeletal: Negative for back pain, gait problem, neck pain and neck stiffness.  Skin: Negative for itching and rash.  Neurological: Negative for dizziness, extremity weakness, gait problem, headaches, light-headedness and seizures.  Hematological: Negative for adenopathy. Does not bruise/bleed easily.  Psychiatric/Behavioral: Negative for confusion, depression and sleep disturbance. The patient is not nervous/anxious.     PHYSICAL EXAMINATION:  Blood pressure 135/84, pulse 84, temperature 97.8 F (36.6 C), temperature source Temporal, resp. rate 20, height '4\' 11"'$  (1.499 m), weight 271 lb 3.2 oz (123 kg), SpO2 100 %.  ECOG PERFORMANCE STATUS: 1  Physical Exam  Constitutional: Oriented to person, place, and time  and well-developed, well-nourished, and in no distress.   HENT:  Head: Normocephalic and atraumatic.  Mouth/Throat: Oropharynx is clear and moist. No oropharyngeal exudate.  Eyes: Conjunctivae are normal. Right eye exhibits no discharge. Left eye exhibits no discharge. No scleral icterus.  Neck: Normal range of motion. Neck supple.  Cardiovascular: Normal rate, regular rhythm, normal heart sounds and intact distal pulses.   Pulmonary/Chest: Effort normal and breath sounds normal. No respiratory distress. No wheezes. No rales.  Abdominal: Soft. Bowel sounds are normal. Exhibits no distension and no mass. There is no tenderness.  Musculoskeletal: Normal range of motion. Exhibits no edema.  Lymphadenopathy:    No cervical adenopathy.  Neurological: Alert and oriented to person, place, and time. Exhibits normal muscle tone. Gait normal. Coordination normal.  Skin: Skin is warm and dry. No rash noted. Not diaphoretic. No erythema. No pallor.  Psychiatric: Mood, memory and judgment normal.  Vitals reviewed.  LABORATORY DATA: Lab Results  Component Value Date   WBC 10.2 12/17/2019   HGB 7.0 Repeated and verified X2. (LL) 12/17/2019   HCT 23.6 Repeated and verified X2. (LL) 12/17/2019   MCV 53.7 (L) 12/17/2019   PLT 343.0 12/17/2019      Chemistry      Component Value Date/Time   NA 140 09/21/2019 0200   K 4.0 09/21/2019 0200   CL 104 09/21/2019 0200   CO2 27 09/21/2019 0200   BUN 10 09/21/2019 0200   CREATININE 0.82 09/21/2019 0200      Component Value Date/Time   CALCIUM 8.8 (L) 09/21/2019 0200   ALKPHOS 71 09/21/2019 0200   AST 35 09/21/2019 0200   ALT 13 09/21/2019 0200   BILITOT 0.6 09/21/2019 0200       RADIOGRAPHIC STUDIES: No results found.  ASSESSMENT: This is a very pleasant 49 year old African-American female referred to the clinic for evaluation of iron deficiency anemia likely secondary to menorrhagia.   PLAN: The patient was seen with Dr. Julien Nordmann today.   Patient had a CBC and iron studies performed at her primary care provider's office yesterday which showed significant iron deficiency with ferritin of 3, and iron saturation of 3%, and total iron at 12.  Her hemoglobin is also 7.0 and is microcytic with an MCV of 59.3 today and an RDW of 21.7.   The patient started back on 325 mg of ferrous gluconate by her primary care provider yesterday.  The patient was advised to continue to take this daily as prescribed.  She was also given a handout on iron rich foods and encouraged to take her iron supplement with vitamin C.   Additionally, we will arrange for the patient to receive IV iron with Venofer weekly x4 starting this Friday on 12/21/2019.   We have also ordered other studies today including a SPEP with immunofixation to rule out underlying  multiple myeloma, hemoglobin electrophoresis, and FOBT stool cards to rule out GI blood loss.  The results of these are still pending.  We will see the patient back for follow-up visit in 6 weeks for evaluation and to repeat her CBC, iron studies, and ferritin.  I will personally call the patient sooner if any of the results of her pending lab studies are abnormal.  The patient voices understanding of current disease status and treatment options and is in agreement with the current care plan.  All questions were answered. The patient knows to call the clinic with any problems, questions or concerns. We can certainly see the patient much sooner if necessary.  Thank you so much for allowing me to participate in the care of Armandina E Zody. I will continue to follow up the patient with you and assist in her care.  The total time spent in the appointment was 60 minutes.  Disclaimer: This note was dictated with voice recognition software. Similar sounding words can inadvertently be transcribed and may not be corrected upon review.   Annesha Delgreco L Abdullahi Vallone December 18, 2019, 2:36  PM  ADDENDUM: Hematology/Oncology Attending: I had a face-to-face encounter with the patient today.  I recommended her care plan.  This is a very pleasant 49 years old African-American female presented for evaluation of persistent iron deficiency anemia.  The patient has a history of menorrhagia and she has been followed by gynecology in the past.  She had no evidence for gastrointestinal bleed but she has no previous testing for her Hemoccult. The patient started oral iron tablet yesterday. Her iron study performed recently showed severe iron deficiency with hemoglobin down to 7.0G/DL. I had a lengthy discussion with the patient today about her condition.  We will order other studies to rule out any other etiology for her anemia including the stool card for Hemoccult, repeat CBC, comprehensive metabolic panel, LDH as well as serum protein electrophoresis and hemoglobin electrophoresis. Because of the severe iron deficiency, will arrange for the patient to receive iron infusion with Venofer for the next 4 weeks. We will see the patient back for follow-up visit in 6 weeks for evaluation and repeat CBC, iron study and ferritin. The patient was also advised to continue with the oral iron tablets for now. The patient was advised to follow-up with her gynecologist for consideration of hormonal treatment to control her menorrhagia. She was advised to call immediately if she has any other concerning symptoms in the interval.  Disclaimer: This note was dictated with voice recognition software. Similar sounding words can inadvertently be transcribed and may be missed upon review. Eilleen Kempf, MD 12/18/19

## 2019-12-18 NOTE — Patient Instructions (Signed)

## 2019-12-19 LAB — PROTEIN ELECTROPHORESIS, SERUM, WITH REFLEX
A/G Ratio: 0.9 (ref 0.7–1.7)
Albumin ELP: 3.2 g/dL (ref 2.9–4.4)
Alpha-1-Globulin: 0.3 g/dL (ref 0.0–0.4)
Alpha-2-Globulin: 0.9 g/dL (ref 0.4–1.0)
Beta Globulin: 1.1 g/dL (ref 0.7–1.3)
Gamma Globulin: 1.3 g/dL (ref 0.4–1.8)
Globulin, Total: 3.6 g/dL (ref 2.2–3.9)
Total Protein ELP: 6.8 g/dL (ref 6.0–8.5)

## 2019-12-20 LAB — HGB FRACTIONATION CASCADE
Hgb A2: 1.6 % — ABNORMAL LOW (ref 1.8–3.2)
Hgb A: 98.4 % (ref 96.4–98.8)
Hgb F: 0 % (ref 0.0–2.0)
Hgb S: 0 %

## 2019-12-21 ENCOUNTER — Telehealth: Payer: Self-pay | Admitting: Physician Assistant

## 2019-12-21 NOTE — Telephone Encounter (Signed)
Scheduled per los. Called and spoke with patient. Confirmed appts  

## 2019-12-28 ENCOUNTER — Other Ambulatory Visit: Payer: Self-pay

## 2019-12-28 ENCOUNTER — Inpatient Hospital Stay: Payer: No Typology Code available for payment source

## 2019-12-28 VITALS — BP 130/76 | HR 72 | Temp 99.1°F | Resp 18 | Wt 274.2 lb

## 2019-12-28 DIAGNOSIS — N920 Excessive and frequent menstruation with regular cycle: Secondary | ICD-10-CM | POA: Diagnosis not present

## 2019-12-28 DIAGNOSIS — D509 Iron deficiency anemia, unspecified: Secondary | ICD-10-CM | POA: Diagnosis not present

## 2019-12-28 DIAGNOSIS — D500 Iron deficiency anemia secondary to blood loss (chronic): Secondary | ICD-10-CM

## 2019-12-28 MED ORDER — SODIUM CHLORIDE 0.9 % IV SOLN
Freq: Once | INTRAVENOUS | Status: AC
  Filled 2019-12-28: qty 250

## 2019-12-28 MED ORDER — SODIUM CHLORIDE 0.9 % IV SOLN
200.0000 mg | Freq: Once | INTRAVENOUS | Status: AC
  Administered 2019-12-28: 200 mg via INTRAVENOUS
  Filled 2019-12-28: qty 200

## 2019-12-28 NOTE — Patient Instructions (Signed)

## 2020-01-01 ENCOUNTER — Encounter: Payer: Self-pay | Admitting: Internal Medicine

## 2020-01-01 ENCOUNTER — Telehealth: Payer: Self-pay | Admitting: Internal Medicine

## 2020-01-01 NOTE — Telephone Encounter (Signed)
Called pt per 6/28 sch message - no answer. Left message for patient  To call back to reschedule.

## 2020-01-04 ENCOUNTER — Other Ambulatory Visit: Payer: Self-pay

## 2020-01-04 ENCOUNTER — Inpatient Hospital Stay: Payer: No Typology Code available for payment source | Attending: Internal Medicine

## 2020-01-04 VITALS — BP 127/72 | HR 62 | Temp 98.6°F | Resp 18

## 2020-01-04 DIAGNOSIS — D500 Iron deficiency anemia secondary to blood loss (chronic): Secondary | ICD-10-CM | POA: Insufficient documentation

## 2020-01-04 DIAGNOSIS — N920 Excessive and frequent menstruation with regular cycle: Secondary | ICD-10-CM | POA: Diagnosis not present

## 2020-01-04 MED ORDER — SODIUM CHLORIDE 0.9 % IV SOLN
Freq: Once | INTRAVENOUS | Status: AC
  Filled 2020-01-04: qty 250

## 2020-01-04 MED ORDER — SODIUM CHLORIDE 0.9 % IV SOLN
200.0000 mg | Freq: Once | INTRAVENOUS | Status: AC
  Administered 2020-01-04: 200 mg via INTRAVENOUS
  Filled 2020-01-04: qty 200

## 2020-01-04 NOTE — Patient Instructions (Signed)

## 2020-01-11 ENCOUNTER — Other Ambulatory Visit: Payer: Self-pay

## 2020-01-11 ENCOUNTER — Inpatient Hospital Stay: Payer: No Typology Code available for payment source

## 2020-01-11 VITALS — BP 157/75 | HR 73 | Temp 98.6°F | Resp 16

## 2020-01-11 DIAGNOSIS — N920 Excessive and frequent menstruation with regular cycle: Secondary | ICD-10-CM | POA: Diagnosis not present

## 2020-01-11 DIAGNOSIS — D500 Iron deficiency anemia secondary to blood loss (chronic): Secondary | ICD-10-CM

## 2020-01-11 MED ORDER — SODIUM CHLORIDE 0.9 % IV SOLN: 200.0000 mg | Freq: Once | INTRAVENOUS | Status: DC

## 2020-01-11 MED ORDER — SODIUM CHLORIDE 0.9 % IV SOLN
Freq: Once | INTRAVENOUS | Status: AC
  Filled 2020-01-11: qty 250

## 2020-01-11 MED ORDER — IRON SUCROSE 20 MG/ML IV SOLN
200.0000 mg | Freq: Once | INTRAVENOUS | Status: DC
  Filled 2020-01-11: qty 10

## 2020-01-11 MED ORDER — SODIUM CHLORIDE 0.9 % IV SOLN
200.0000 mg | Freq: Once | INTRAVENOUS | Status: AC
  Administered 2020-01-11: 200 mg via INTRAVENOUS
  Filled 2020-01-11: qty 200

## 2020-01-11 NOTE — Patient Instructions (Signed)

## 2020-01-18 ENCOUNTER — Inpatient Hospital Stay: Payer: No Typology Code available for payment source

## 2020-01-18 ENCOUNTER — Other Ambulatory Visit: Payer: Self-pay

## 2020-01-18 VITALS — BP 117/77 | HR 60 | Temp 98.8°F | Resp 18

## 2020-01-18 DIAGNOSIS — D500 Iron deficiency anemia secondary to blood loss (chronic): Secondary | ICD-10-CM

## 2020-01-18 DIAGNOSIS — N920 Excessive and frequent menstruation with regular cycle: Secondary | ICD-10-CM | POA: Diagnosis not present

## 2020-01-18 MED ORDER — SODIUM CHLORIDE 0.9 % IV SOLN
200.0000 mg | Freq: Once | INTRAVENOUS | Status: AC
  Administered 2020-01-18: 200 mg via INTRAVENOUS
  Filled 2020-01-18: qty 200

## 2020-01-18 MED ORDER — SODIUM CHLORIDE 0.9 % IV SOLN
Freq: Once | INTRAVENOUS | Status: AC
  Filled 2020-01-18: qty 250

## 2020-01-18 NOTE — Patient Instructions (Signed)

## 2020-01-24 ENCOUNTER — Ambulatory Visit (INDEPENDENT_AMBULATORY_CARE_PROVIDER_SITE_OTHER): Payer: No Typology Code available for payment source | Admitting: Nurse Practitioner

## 2020-01-24 ENCOUNTER — Other Ambulatory Visit: Payer: Self-pay

## 2020-01-24 ENCOUNTER — Encounter: Payer: Self-pay | Admitting: Nurse Practitioner

## 2020-01-24 VITALS — BP 128/82 | HR 86 | Temp 96.7°F | Ht 59.0 in | Wt 275.0 lb

## 2020-01-24 DIAGNOSIS — Z136 Encounter for screening for cardiovascular disorders: Secondary | ICD-10-CM

## 2020-01-24 DIAGNOSIS — Z13220 Encounter for screening for lipoid disorders: Secondary | ICD-10-CM | POA: Diagnosis not present

## 2020-01-24 DIAGNOSIS — Z6841 Body Mass Index (BMI) 40.0 and over, adult: Secondary | ICD-10-CM

## 2020-01-24 DIAGNOSIS — Z0000 Encounter for general adult medical examination without abnormal findings: Secondary | ICD-10-CM | POA: Diagnosis not present

## 2020-01-24 DIAGNOSIS — D500 Iron deficiency anemia secondary to blood loss (chronic): Secondary | ICD-10-CM | POA: Diagnosis not present

## 2020-01-24 DIAGNOSIS — E8881 Metabolic syndrome: Secondary | ICD-10-CM

## 2020-01-24 LAB — LIPID PANEL
Cholesterol: 138 mg/dL (ref 0–200)
HDL: 34.9 mg/dL — ABNORMAL LOW (ref >39.00)
LDL Cholesterol: 87 mg/dL (ref 0–99)
NonHDL: 103.47
Total CHOL/HDL Ratio: 4
Triglycerides: 80 mg/dL (ref 0.0–149.0)
VLDL: 16 mg/dL (ref 0.0–40.0)

## 2020-01-24 LAB — TSH: TSH: 1.73 u[IU]/mL (ref 0.35–4.50)

## 2020-01-24 NOTE — Assessment & Plan Note (Addendum)
We discussed the need for weight loss in order to decrease her risk for DM, CAD, fatty liver, dyslipidemia and OA. We discuss need for diet changes, decrease daily calorie intake and exercise. Wt Readings from Last 3 Encounters:  01/24/20 275 lb (124.7 kg)  12/28/19 274 lb 4 oz (124.4 kg)  12/18/19 271 lb 3.2 oz (123 kg)   She agreed to make lifestyle chnages and f/up in 28months. Increased random insulin, low vitamin D Normal TSh and lipid panel Start ozempic 0.5mg  weekly and vitamin D supplement 5000IU daily F/up in 68month She declined referral to weight loss clinic at this time.

## 2020-01-24 NOTE — Assessment & Plan Note (Signed)
Secondary to menorrhagia, Has upcoming appt with GYN Managed by hematology wit oral and IV iron. Reports improved pica and fatigue.

## 2020-01-24 NOTE — Patient Instructions (Addendum)
Go to lab for blood draw.  Start DASH diet, small portion and daily exercise to promote weight loss.  Schedule appt with GYN for PAP abd breast exam, have report faxed to me.  Calorie Counting for Weight Loss Calories are units of energy. Your body needs a certain amount of calories from food to keep you going throughout the day. When you eat more calories than your body needs, your body stores the extra calories as fat. When you eat fewer calories than your body needs, your body burns fat to get the energy it needs. Calorie counting means keeping track of how many calories you eat and drink each day. Calorie counting can be helpful if you need to lose weight. If you make sure to eat fewer calories than your body needs, you should lose weight. Ask your health care provider what a healthy weight is for you. For calorie counting to work, you will need to eat the right number of calories in a day in order to lose a healthy amount of weight per week. A dietitian can help you determine how many calories you need in a day and will give you suggestions on how to reach your calorie goal.  A healthy amount of weight to lose per week is usually 1-2 lb (0.5-0.9 kg). This usually means that your daily calorie intake should be reduced by 500-750 calories.  Eating 1,200 - 1,500 calories per day can help most women lose weight.  Eating 1,500 - 1,800 calories per day can help most men lose weight. What is my plan? My goal is to have __________ calories per day. If I have this many calories per day, I should lose around __________ pounds per week. What do I need to know about calorie counting? In order to meet your daily calorie goal, you will need to:  Find out how many calories are in each food you would like to eat. Try to do this before you eat.  Decide how much of the food you plan to eat.  Write down what you ate and how many calories it had. Doing this is called keeping a food log. To successfully  lose weight, it is important to balance calorie counting with a healthy lifestyle that includes regular activity. Aim for 150 minutes of moderate exercise (such as walking) or 75 minutes of vigorous exercise (such as running) each week. Where do I find calorie information?  The number of calories in a food can be found on a Nutrition Facts label. If a food does not have a Nutrition Facts label, try to look up the calories online or ask your dietitian for help. Remember that calories are listed per serving. If you choose to have more than one serving of a food, you will have to multiply the calories per serving by the amount of servings you plan to eat. For example, the label on a package of bread might say that a serving size is 1 slice and that there are 90 calories in a serving. If you eat 1 slice, you will have eaten 90 calories. If you eat 2 slices, you will have eaten 180 calories. How do I keep a food log? Immediately after each meal, record the following information in your food log:  What you ate. Don't forget to include toppings, sauces, and other extras on the food.  How much you ate. This can be measured in cups, ounces, or number of items.  How many calories each food and drink had.  The total number of calories in the meal. Keep your food log near you, such as in a small notebook in your pocket, or use a mobile app or website. Some programs will calculate calories for you and show you how many calories you have left for the day to meet your goal. What are some calorie counting tips?   Use your calories on foods and drinks that will fill you up and not leave you hungry: ? Some examples of foods that fill you up are nuts and nut butters, vegetables, lean proteins, and high-fiber foods like whole grains. High-fiber foods are foods with more than 5 g fiber per serving. ? Drinks such as sodas, specialty coffee drinks, alcohol, and juices have a lot of calories, yet do not fill you  up.  Eat nutritious foods and avoid empty calories. Empty calories are calories you get from foods or beverages that do not have many vitamins or protein, such as candy, sweets, and soda. It is better to have a nutritious high-calorie food (such as an avocado) than a food with few nutrients (such as a bag of chips).  Know how many calories are in the foods you eat most often. This will help you calculate calorie counts faster.  Pay attention to calories in drinks. Low-calorie drinks include water and unsweetened drinks.  Pay attention to nutrition labels for "low fat" or "fat free" foods. These foods sometimes have the same amount of calories or more calories than the full fat versions. They also often have added sugar, starch, or salt, to make up for flavor that was removed with the fat.  Find a way of tracking calories that works for you. Get creative. Try different apps or programs if writing down calories does not work for you. What are some portion control tips?  Know how many calories are in a serving. This will help you know how many servings of a certain food you can have.  Use a measuring cup to measure serving sizes. You could also try weighing out portions on a kitchen scale. With time, you will be able to estimate serving sizes for some foods.  Take some time to put servings of different foods on your favorite plates, bowls, and cups so you know what a serving looks like.  Try not to eat straight from a bag or box. Doing this can lead to overeating. Put the amount you would like to eat in a cup or on a plate to make sure you are eating the right portion.  Use smaller plates, glasses, and bowls to prevent overeating.  Try not to multitask (for example, watch TV or use your computer) while eating. If it is time to eat, sit down at a table and enjoy your food. This will help you to know when you are full. It will also help you to be aware of what you are eating and how much you are  eating. What are tips for following this plan? Reading food labels  Check the calorie count compared to the serving size. The serving size may be smaller than what you are used to eating.  Check the source of the calories. Make sure the food you are eating is high in vitamins and protein and low in saturated and trans fats. Shopping  Read nutrition labels while you shop. This will help you make healthy decisions before you decide to purchase your food.  Make a grocery list and stick to it. Cooking  Try to [employer]  your favorite foods in a healthier way. For example, try baking instead of frying.  Use low-fat dairy products. Meal planning  Use more fruits and vegetables. Half of your plate should be fruits and vegetables.  Include lean proteins like poultry and fish. How do I count calories when eating out?  Ask for smaller portion sizes.  Consider sharing an entree and sides instead of getting your own entree.  If you get your own entree, eat only half. Ask for a box at the beginning of your meal and put the rest of your entree in it so you are not tempted to eat it.  If calories are listed on the menu, choose the lower calorie options.  Choose dishes that include vegetables, fruits, whole grains, low-fat dairy products, and lean protein.  Choose items that are boiled, broiled, grilled, or steamed. Stay away from items that are buttered, battered, fried, or served with cream sauce. Items labeled "crispy" are usually fried, unless stated otherwise.  Choose water, low-fat milk, unsweetened iced tea, or other drinks without added sugar. If you want an alcoholic beverage, choose a lower calorie option such as a glass of wine or light beer.  Ask for dressings, sauces, and syrups on the side. These are usually high in calories, so you should limit the amount you eat.  If you want a salad, choose a garden salad and ask for grilled meats. Avoid extra toppings like bacon, cheese, or fried  items. Ask for the dressing on the side, or ask for olive oil and vinegar or lemon to use as dressing.  Estimate how many servings of a food you are given. For example, a serving of cooked rice is  cup or about the size of half a baseball. Knowing serving sizes will help you be aware of how much food you are eating at restaurants. The list below tells you how big or small some common portion sizes are based on everyday objects: ? 1 oz--4 stacked dice. ? 3 oz--1 deck of cards. ? 1 tsp--1 die. ? 1 Tbsp-- a ping-pong ball. ? 2 Tbsp--1 ping-pong ball. ?  cup-- baseball. ? 1 cup--1 baseball. Summary  Calorie counting means keeping track of how many calories you eat and drink each day. If you eat fewer calories than your body needs, you should lose weight.  A healthy amount of weight to lose per week is usually 1-2 lb (0.5-0.9 kg). This usually means reducing your daily calorie intake by 500-750 calories.  The number of calories in a food can be found on a Nutrition Facts label. If a food does not have a Nutrition Facts label, try to look up the calories online or ask your dietitian for help.  Use your calories on foods and drinks that will fill you up, and not on foods and drinks that will leave you hungry.  Use smaller plates, glasses, and bowls to prevent overeating. This information is not intended to replace advice given to you by your health care provider. Make sure you discuss any questions you have with your health care provider. Document Revised: 03/10/2018 Document Reviewed: 05/21/2016 Elsevier Patient Education  [employer].

## 2020-01-24 NOTE — Progress Notes (Addendum)
Subjective:    Patient ID: Renee Hahn, female    DOB: August 28, 1970, 49 y.o.   MRN: [id number]  Patient presents today for CPE and  eval of chronic conditions  HPI Morbid obesity ([place]) We discussed the need for weight loss in order to decrease her risk for DM, CAD, fatty liver, dyslipidemia and OA. We discuss need for diet changes, decrease daily calorie intake and exercise. Wt Readings from Last 3 Encounters:  01/24/20 275 lb (124.7 kg)  12/28/19 274 lb 4 oz (124.4 kg)  12/18/19 271 lb 3.2 oz (123 kg)   She agreed to make lifestyle chnages and f/up in 27months. Increased random insulin, low vitamin D Normal TSh and lipid panel Start ozempic 0.5mg  weekly and vitamin D supplement 5000IU daily F/up in 40month She declined referral to weight loss clinic at this time.  Iron deficiency anemia due to chronic blood loss Secondary to menorrhagia, Has upcoming appt with GYN Managed by hematology wit oral and IV iron. Reports improved pica and fatigue.  Sexual History (orientation,birth control, marital status, STD):up coming appt with GYN  Depression/Suicide: Depression screen [facility] 2/9 01/24/2020 12/17/2019  Decreased Interest 0 0  Down, Depressed, Hopeless 0 0  PHQ - 2 Score 0 0   Vision:will schedule  Dental:will schedule  Immunizations: (TDAP, Hep C screen, Pneumovax, Influenza, zoster)  Health Maintenance  Topic Date Due  . COVID-19 Vaccine (1) Never done  . Tetanus Vaccine  Never done  . Pap Smear  01/12/2018  .  Hepatitis C: One time screening is recommended by Center for Disease Control  (CDC) for  adults born from [birth year] through 1965.   01/23/2021*  . HIV Screening  01/23/2021*  . Flu Shot  02/03/2020  *Topic was postponed. The date shown is not the original due date.   Fall Risk: Fall Risk  01/24/2020 12/17/2019  Falls in the past year? 0 0  Number falls in past yr: 0 0  Injury with Fall? 0 0   Advanced Directive: Advanced Directives 01/11/2020  Does Patient Have  a Medical Advance Directive? No  Would patient like information on creating a medical advance directive? No - Patient declined     Medications and allergies reviewed with patient and updated if appropriate.  Patient Active Problem List   Diagnosis Date Noted  . BMI 50.0-59.9, adult ([place]) 01/28/2020  . Morbid obesity ([facility]) 01/24/2020  . Menorrhagia 12/18/2019  . Iron deficiency anemia due to chronic blood loss 12/17/2019  . Seborrhea 12/17/2019  . S/P laparoscopic cholecystectomy 09/21/2019    Current Outpatient Medications on File Prior to Visit  Medication Sig Dispense Refill  . clotrimazole-betamethasone (LOTRISONE) cream Apply 1 application topically 2 (two) times daily. 45 g 2  . ferrous gluconate (FERGON) 324 MG tablet Take 1 tablet (324 mg total) by mouth 3 (three) times daily with meals. 90 tablet 3  . ibuprofen (ADVIL,MOTRIN) 200 MG tablet Take 400 mg by mouth every 6 (six) hours as needed for moderate pain.    Marland Kitchen ketoconazole (NIZORAL) 2 % shampoo Apply 1 application topically 2 (two) times a week. 120 mL 2   No current facility-administered medications on file prior to visit.    Past Medical History:  Diagnosis Date  . Hay fever     Past Surgical History:  Procedure Laterality Date  . CHOLECYSTECTOMY N/A 09/21/2019   Procedure: LAPAROSCOPIC CHOLECYSTECTOMY WITH INTRAOPERATIVE CHOLANGIOGRAM;  Surgeon: Erroll Luna, MD;  Location: [facility];  Service: General;  Laterality: N/A;  Social History   Socioeconomic History  . Marital status: Widowed    Spouse name: Not on file  . Number of children: Not on file  . Years of education: Not on file  . Highest education level: Not on file  Occupational History  . Not on file  Tobacco Use  . Smoking status: Never Smoker  . Smokeless tobacco: Never Used  Substance and Sexual Activity  . Alcohol use: Yes    Comment: socially  . Drug use: No  . Sexual activity: Not on file  Other Topics Concern  . Not on file   Social History Narrative  . Not on file   Social Determinants of Health   Financial Resource Strain:   . Difficulty of Paying Living Expenses:   Food Insecurity:   . Worried About Charity fundraiser in the Last Year:   . Arboriculturist in the Last Year:   Transportation Needs:   . Film/video editor (Medical):   Marland Kitchen Lack of Transportation (Non-Medical):   Physical Activity:   . Days of Exercise per Week:   . Minutes of Exercise per Session:   Stress:   . Feeling of Stress :   Social Connections:   . Frequency of Communication with Friends and Family:   . Frequency of Social Gatherings with Friends and Family:   . Attends Religious Services:   . Active Member of Clubs or Organizations:   . Attends Archivist Meetings:   Marland Kitchen Marital Status:     Family History  Problem Relation Age of Onset  . Cancer Other   . Cancer Mother        breast  . Cancer Father        protaste  . Hypertension Maternal Grandmother   . Hypertension Paternal Grandfather         Review of Systems  Constitutional: Negative for fever, malaise/fatigue and weight loss.  HENT: Negative for congestion and sore throat.   Eyes:       Negative for visual changes  Respiratory: Negative for cough and shortness of breath.   Cardiovascular: Negative for chest pain, palpitations and leg swelling.  Gastrointestinal: Negative for blood in stool, constipation, diarrhea and heartburn.  Genitourinary: Negative for dysuria, frequency and urgency.  Musculoskeletal: Negative for falls, joint pain and myalgias.  Skin: Negative for rash.  Neurological: Negative for dizziness, sensory change and headaches.  Endo/Heme/Allergies: Does not bruise/bleed easily.  Psychiatric/Behavioral: Negative for depression, substance abuse and suicidal ideas. The patient is not nervous/anxious.     Objective:   Vitals:   01/24/20 0932  BP: 128/82  Pulse: 86  Temp: (!) 96.7 F (35.9 C)  SpO2: 100%    Body mass  index is 55.54 kg/m.   Physical Examination:  Physical Exam Vitals reviewed.  Constitutional:      General: She is not in acute distress.    Appearance: She is well-developed. She is obese.  HENT:     Right Ear: Tympanic membrane, ear canal and external ear normal.     Left Ear: Tympanic membrane, ear canal and external ear normal.     Mouth/Throat:     Pharynx: No oropharyngeal exudate.  Eyes:     Extraocular Movements: Extraocular movements intact.     Conjunctiva/sclera: Conjunctivae normal.  Cardiovascular:     Rate and Rhythm: Normal rate and regular rhythm.     Heart sounds: Normal heart sounds.  Pulmonary:     Effort: Pulmonary effort  is normal. No respiratory distress.     Breath sounds: Normal breath sounds.  Chest:     Chest wall: No tenderness.  Abdominal:     General: Bowel sounds are normal.     Palpations: Abdomen is soft.  Genitourinary:    Comments: Deferred breast and pelvic exam to GYN per patient Musculoskeletal:        General: Normal range of motion.     Cervical back: Normal range of motion and neck supple.  Lymphadenopathy:     Cervical: No cervical adenopathy.  Skin:    General: Skin is warm and dry.  Neurological:     Mental Status: She is alert and oriented to person, place, and time.     Deep Tendon Reflexes: Reflexes are normal and symmetric.  Psychiatric:        Mood and Affect: Mood normal.        Behavior: Behavior normal.    ASSESSMENT and PLAN: This visit occurred during the SARS-CoV-2 public health emergency.  Safety protocols were in place, including screening questions prior to the visit, additional usage of staff PPE, and extensive cleaning of exam room while observing appropriate contact time as indicated for disinfecting solutions.   Caylah was seen today for annual exam.  Diagnoses and all orders for this visit:  Preventative health care  Morbid obesity ([place]) -     Lipid panel -     TSH -     Vitamin D 1,25  dihydroxy -     Insulin, random -     Semaglutide, 1 MG/DOSE, (OZEMPIC, 1 MG/DOSE,) 2 MG/1.5ML SOPN; Inject 0.375 mLs (0.5 mg total) into the skin once a week.  BMI 50.0-59.9, adult ([facility]) -     Lipid panel -     TSH -     Vitamin D 1,25 dihydroxy -     Semaglutide, 1 MG/DOSE, (OZEMPIC, 1 MG/DOSE,) 2 MG/1.5ML SOPN; Inject 0.375 mLs (0.5 mg total) into the skin once a week.  Encounter for lipid screening for cardiovascular disease -     Lipid panel  Iron deficiency anemia due to chronic blood loss  Insulin resistance syndrome -     Semaglutide, 1 MG/DOSE, (OZEMPIC, 1 MG/DOSE,) 2 MG/1.5ML SOPN; Inject 0.375 mLs (0.5 mg total) into the skin once a week.        Problem List Items Addressed This Visit      Other   BMI 50.0-59.9, adult ([place])   Relevant Medications   Semaglutide, 1 MG/DOSE, (OZEMPIC, 1 MG/DOSE,) 2 MG/1.5ML SOPN   Other Relevant Orders   Lipid panel (Completed)   TSH (Completed)   Vitamin D 1,25 dihydroxy (Completed)   Iron deficiency anemia due to chronic blood loss    Secondary to menorrhagia, Has upcoming appt with GYN Managed by hematology wit oral and IV iron. Reports improved pica and fatigue.      Morbid obesity ([place])    We discussed the need for weight loss in order to decrease her risk for DM, CAD, fatty liver, dyslipidemia and OA. We discuss need for diet changes, decrease daily calorie intake and exercise. Wt Readings from Last 3 Encounters:  01/24/20 275 lb (124.7 kg)  12/28/19 274 lb 4 oz (124.4 kg)  12/18/19 271 lb 3.2 oz (123 kg)   She agreed to make lifestyle chnages and f/up in 59months. Increased random insulin, low vitamin D Normal TSh and lipid panel Start ozempic 0.5mg  weekly and vitamin D supplement 5000IU daily F/up in 2month She  declined referral to weight loss clinic at this time.      Relevant Medications   Semaglutide, 1 MG/DOSE, (OZEMPIC, 1 MG/DOSE,) 2 MG/1.5ML SOPN   Other Relevant Orders   Lipid panel (Completed)   TSH  (Completed)   Vitamin D 1,25 dihydroxy (Completed)   Insulin, random (Completed)    Other Visit Diagnoses    Preventative health care    -  Primary   Encounter for lipid screening for cardiovascular disease       Relevant Orders   Lipid panel (Completed)   Insulin resistance syndrome       Relevant Medications   Semaglutide, 1 MG/DOSE, (OZEMPIC, 1 MG/DOSE,) 2 MG/1.5ML SOPN      I have spent 79mins with this patient regarding history taking, documentation, review of hematology (labs and specialty note), formulating plan and discussing treatment options for weight management with patient.  Follow up: Return in about 3 months (around 04/25/2020) for weight management (41mins, F2F).  Wilfred Lacy, NP

## 2020-01-27 LAB — VITAMIN D 1,25 DIHYDROXY
Vitamin D 1, 25 (OH)2 Total: 32 pg/mL (ref 18–72)
Vitamin D2 1, 25 (OH)2: <8 pg/mL
Vitamin D3 1, 25 (OH)2: 32 pg/mL

## 2020-01-27 LAB — INSULIN, RANDOM: Insulin: 24.7 u[IU]/mL — ABNORMAL HIGH

## 2020-01-28 DIAGNOSIS — Z6841 Body Mass Index (BMI) 40.0 and over, adult: Secondary | ICD-10-CM

## 2020-01-28 HISTORY — DX: Body Mass Index (BMI) 40.0 and over, adult: Z684

## 2020-01-28 MED ORDER — OZEMPIC (1 MG/DOSE) 2 MG/1.5ML ~~LOC~~ SOPN: 0.5000 mg | PEN_INJECTOR | SUBCUTANEOUS | 0 refills | Status: AC

## 2020-01-28 NOTE — Addendum Note (Signed)
Addended by: Leana Gamer on: 01/28/2020 05:47 PM   Modules accepted: Orders

## 2020-01-29 ENCOUNTER — Inpatient Hospital Stay (HOSPITAL_BASED_OUTPATIENT_CLINIC_OR_DEPARTMENT_OTHER): Payer: No Typology Code available for payment source | Admitting: Internal Medicine

## 2020-01-29 ENCOUNTER — Telehealth: Payer: Self-pay | Admitting: Internal Medicine

## 2020-01-29 ENCOUNTER — Inpatient Hospital Stay: Payer: No Typology Code available for payment source

## 2020-01-29 ENCOUNTER — Encounter: Payer: Self-pay | Admitting: Internal Medicine

## 2020-01-29 ENCOUNTER — Other Ambulatory Visit: Payer: Self-pay

## 2020-01-29 VITALS — BP 130/90 | HR 75 | Temp 97.8°F | Resp 18 | Ht 59.0 in | Wt 272.2 lb

## 2020-01-29 DIAGNOSIS — D500 Iron deficiency anemia secondary to blood loss (chronic): Secondary | ICD-10-CM | POA: Diagnosis not present

## 2020-01-29 DIAGNOSIS — N920 Excessive and frequent menstruation with regular cycle: Secondary | ICD-10-CM | POA: Diagnosis not present

## 2020-01-29 LAB — CBC WITH DIFFERENTIAL (CANCER CENTER ONLY)
Abs Immature Granulocytes: 0.02 10*3/uL (ref 0.00–0.07)
Basophils Absolute: 0 10*3/uL (ref 0.0–0.1)
Basophils Relative: 0 %
Eosinophils Absolute: 0.1 10*3/uL (ref 0.0–0.5)
Eosinophils Relative: 2 %
HCT: 39.1 % (ref 36.0–46.0)
Hemoglobin: 11.1 g/dL — ABNORMAL LOW (ref 12.0–15.0)
Immature Granulocytes: 0 %
Lymphocytes Relative: 22 %
Lymphs Abs: 2 10*3/uL (ref 0.7–4.0)
MCH: 20.5 pg — ABNORMAL LOW (ref 26.0–34.0)
MCHC: 28.4 g/dL — ABNORMAL LOW (ref 30.0–36.0)
MCV: 72.1 fL — ABNORMAL LOW (ref 80.0–100.0)
Monocytes Absolute: 0.5 10*3/uL (ref 0.1–1.0)
Monocytes Relative: 5 %
Neutro Abs: 6.4 10*3/uL (ref 1.7–7.7)
Neutrophils Relative %: 71 %
Platelet Count: 251 10*3/uL (ref 150–400)
RBC: 5.42 MIL/uL — ABNORMAL HIGH (ref 3.87–5.11)
WBC Count: 9.1 10*3/uL (ref 4.0–10.5)
nRBC: 0 % (ref 0.0–0.2)

## 2020-01-29 LAB — IRON AND TIBC
Iron: 37 ug/dL — ABNORMAL LOW (ref 41–142)
Saturation Ratios: 12 % — ABNORMAL LOW (ref 21–57)
TIBC: 301 ug/dL (ref 236–444)
UIBC: 264 ug/dL (ref 120–384)

## 2020-01-29 LAB — FERRITIN: Ferritin: 124 ng/mL (ref 11–307)

## 2020-01-29 NOTE — Telephone Encounter (Signed)
Scheduled per 07/27, patient received updated calender and After visit summary.

## 2020-01-29 NOTE — Progress Notes (Signed)
Renee Hahn:([phone]   Fax:(336) [phone]  OFFICE PROGRESS NOTE  Nche, [name], NP [place] [address]  DIAGNOSIS: Iron deficiency anemia secondary to menorrhagia.  PRIOR THERAPY: Iron infusion with Venofer weekly for 4 weeks.  First dose was on 12/21/2019.  CURRENT THERAPY: Ferrous gluconate 325 mg p.o. daily.  INTERVAL HISTORY: Renee Hahn 49 y.o. female returns to the clinic today for follow-up visit.  The patient is feeling fine today with no concerning complaints.  She has significant improvement in her fatigue after the iron infusion.  She is currently on ferrous gluconate and tolerating it well.  She denied having any current chest pain, shortness of breath, cough or hemoptysis.  She denied having any fever or chills.  She has no nausea, vomiting, diarrhea or constipation.  She has no headache or visual changes.  She is here today for evaluation and repeat CBC, iron study and ferritin.  MEDICAL HISTORY: Past Medical History:  Diagnosis Date  . Hay fever     ALLERGIES:  has No Known Allergies.  MEDICATIONS:  Current Outpatient Medications  Medication Sig Dispense Refill  . clotrimazole-betamethasone (LOTRISONE) cream Apply 1 application topically 2 (two) times daily. 45 g 2  . ferrous gluconate (FERGON) 324 MG tablet Take 1 tablet (324 mg total) by mouth 3 (three) times daily with meals. 90 tablet 3  . ibuprofen (ADVIL,MOTRIN) 200 MG tablet Take 400 mg by mouth every 6 (six) hours as needed for moderate pain.    Marland Kitchen ketoconazole (NIZORAL) 2 % shampoo Apply 1 application topically 2 (two) times a week. 120 mL 2  . Semaglutide, 1 MG/DOSE, (OZEMPIC, 1 MG/DOSE,) 2 MG/1.5ML SOPN Inject 0.375 mLs (0.5 mg total) into the skin once a week. (Patient not taking: Reported on 01/29/2020) 1.5 mL 0   No current facility-administered medications for this visit.    SURGICAL HISTORY:  Past Surgical History:  Procedure  Laterality Date  . CHOLECYSTECTOMY N/A 09/21/2019   Procedure: LAPAROSCOPIC CHOLECYSTECTOMY WITH INTRAOPERATIVE CHOLANGIOGRAM;  Surgeon: Erroll Luna, MD;  Location: [facility];  Service: General;  Laterality: N/A;    REVIEW OF SYSTEMS:  A comprehensive review of systems was negative.   PHYSICAL EXAMINATION: General appearance: alert, cooperative and no distress Head: Normocephalic, without obvious abnormality, atraumatic Neck: no adenopathy, no JVD, supple, symmetrical, trachea midline and thyroid not enlarged, symmetric, no tenderness/mass/nodules Lymph nodes: Cervical, supraclavicular, and axillary nodes normal. Resp: clear to auscultation bilaterally Back: symmetric, no curvature. ROM normal. No CVA tenderness. Cardio: regular rate and rhythm, S1, S2 normal, no murmur, click, rub or gallop GI: soft, non-tender; bowel sounds normal; no masses,  no organomegaly Extremities: extremities normal, atraumatic, no cyanosis or edema  ECOG PERFORMANCE STATUS: 1 - Symptomatic but completely ambulatory  Blood pressure (!) 130/90, pulse 75, temperature 97.8 F (36.6 C), temperature source Temporal, resp. rate 18, height 4\' 11"  (1.499 m), weight (!) 272 lb 3.2 oz (123.5 kg), SpO2 100 %.  LABORATORY DATA: Lab Results  Component Value Date   WBC 9.1 01/29/2020   HGB 11.1 (L) 01/29/2020   HCT 39.1 01/29/2020   MCV 72.1 (L) 01/29/2020   PLT 251 01/29/2020      Chemistry      Component Value Date/Time   NA 145 12/18/2019 1431   K 3.7 12/18/2019 1431   CL 107 12/18/2019 1431   CO2 27 12/18/2019 1431   BUN 9 12/18/2019 1431   CREATININE 0.82 12/18/2019 1431  Component Value Date/Time   CALCIUM 9.1 12/18/2019 1431   ALKPHOS 72 12/18/2019 1431   AST 66 (H) 12/18/2019 1431   ALT 15 12/18/2019 1431   BILITOT 0.6 12/18/2019 1431       RADIOGRAPHIC STUDIES: No results found.  ASSESSMENT AND PLAN: This is a very pleasant 49 years old African-American female with history of iron  deficiency anemia secondary to menorrhagia.  The patient status post treatment with Venofer infusion weekly for 4 weeks. She tolerated her treatment well and she has significant improvement in her energy level. CBC today showed significant improvement in her hemoglobin and hematocrit but she continues to have mild anemia.  She also has improvement in the iron study and ferritin. I recommended for the patient to continue with the oral iron tablets for now. I will see her back for follow-up visit in 3 months for evaluation and repeat CBC, iron study and ferritin. She was also advised to call immediately if she has any concerning symptoms in the interval. The patient voices understanding of current disease status and treatment options and is in agreement with the current care plan.  All questions were answered. The patient knows to call the clinic with any problems, questions or concerns. We can certainly see the patient much sooner if necessary.   Disclaimer: This note was dictated with voice recognition software. Similar sounding words can inadvertently be transcribed and may not be corrected upon review.

## 2020-02-15 ENCOUNTER — Other Ambulatory Visit: Payer: Self-pay | Admitting: Nurse Practitioner

## 2020-02-15 DIAGNOSIS — D500 Iron deficiency anemia secondary to blood loss (chronic): Secondary | ICD-10-CM

## 2020-04-28 ENCOUNTER — Ambulatory Visit: Payer: No Typology Code available for payment source | Admitting: Nurse Practitioner

## 2020-04-30 ENCOUNTER — Inpatient Hospital Stay (HOSPITAL_BASED_OUTPATIENT_CLINIC_OR_DEPARTMENT_OTHER): Payer: No Typology Code available for payment source | Admitting: Internal Medicine

## 2020-04-30 ENCOUNTER — Telehealth: Payer: Self-pay | Admitting: Internal Medicine

## 2020-04-30 ENCOUNTER — Other Ambulatory Visit: Payer: Self-pay

## 2020-04-30 ENCOUNTER — Encounter: Payer: Self-pay | Admitting: Internal Medicine

## 2020-04-30 ENCOUNTER — Inpatient Hospital Stay: Payer: No Typology Code available for payment source | Attending: Internal Medicine

## 2020-04-30 VITALS — BP 120/93 | HR 74 | Temp 98.2°F | Resp 18 | Ht 59.0 in | Wt 281.7 lb

## 2020-04-30 DIAGNOSIS — D500 Iron deficiency anemia secondary to blood loss (chronic): Secondary | ICD-10-CM

## 2020-04-30 DIAGNOSIS — N920 Excessive and frequent menstruation with regular cycle: Secondary | ICD-10-CM | POA: Insufficient documentation

## 2020-04-30 LAB — CBC WITH DIFFERENTIAL (CANCER CENTER ONLY)
Abs Immature Granulocytes: 0.04 10*3/uL (ref 0.00–0.07)
Basophils Absolute: 0 10*3/uL (ref 0.0–0.1)
Basophils Relative: 0 %
Eosinophils Absolute: 0.2 10*3/uL (ref 0.0–0.5)
Eosinophils Relative: 2 %
HCT: 38.1 % (ref 36.0–46.0)
Hemoglobin: 12.1 g/dL (ref 12.0–15.0)
Immature Granulocytes: 0 %
Lymphocytes Relative: 21 %
Lymphs Abs: 2 10*3/uL (ref 0.7–4.0)
MCH: 24.3 pg — ABNORMAL LOW (ref 26.0–34.0)
MCHC: 31.8 g/dL (ref 30.0–36.0)
MCV: 76.5 fL — ABNORMAL LOW (ref 80.0–100.0)
Monocytes Absolute: 0.4 10*3/uL (ref 0.1–1.0)
Monocytes Relative: 4 %
Neutro Abs: 6.9 10*3/uL (ref 1.7–7.7)
Neutrophils Relative %: 73 %
Platelet Count: 255 10*3/uL (ref 150–400)
RBC: 4.98 MIL/uL (ref 3.87–5.11)
RDW: 15.4 % (ref 11.5–15.5)
WBC Count: 9.5 10*3/uL (ref 4.0–10.5)
nRBC: 0 % (ref 0.0–0.2)

## 2020-04-30 LAB — IRON AND TIBC
Iron: 43 ug/dL (ref 41–142)
Saturation Ratios: 13 % — ABNORMAL LOW (ref 21–57)
TIBC: 341 ug/dL (ref 236–444)
UIBC: 298 ug/dL (ref 120–384)

## 2020-04-30 LAB — FERRITIN: Ferritin: 21 ng/mL (ref 11–307)

## 2020-04-30 NOTE — Telephone Encounter (Signed)
Scheduled appointment per 10/27 los. Spoke to patient who is aware of appointment date and time.

## 2020-04-30 NOTE — Progress Notes (Signed)
[name]:([phone]   Fax:(336) [phone]  OFFICE PROGRESS NOTE  Nche, [name], NP [name] [address]  DIAGNOSIS: Iron deficiency anemia secondary to menorrhagia.  PRIOR THERAPY: Iron infusion with Venofer weekly for 4 weeks.  First dose was on 12/21/2019.  CURRENT THERAPY: Ferrous gluconate 325 mg p.o. daily.  INTERVAL HISTORY: Renee Hahn 49 y.o. female returns to the clinic today for follow-up visit.  The patient is feeling fine today with no concerning complaints.  She has significant improvement in her fatigue.  She denied having any chest pain, shortness of breath, cough or hemoptysis.  She denied having any fever or chills.  She has no nausea, vomiting, diarrhea or constipation.  She has no bleeding issues.  She continues to tolerate her treatment with ferrous gluconate fairly well.  She is here today for evaluation and repeat CBC, iron study and ferritin.  MEDICAL HISTORY: Past Medical History:  Diagnosis Date  . Hay fever     ALLERGIES:  has No Known Allergies.  MEDICATIONS:  Current Outpatient Medications  Medication Sig Dispense Refill  . clotrimazole-betamethasone (LOTRISONE) cream Apply 1 application topically 2 (two) times daily. 45 g 2  . ferrous gluconate (FERGON) 324 MG tablet TAKE 1 TABLET (324 MG TOTAL) BY MOUTH 3 (THREE) TIMES DAILY WITH MEALS. 90 tablet 3  . ibuprofen (ADVIL,MOTRIN) 200 MG tablet Take 400 mg by mouth every 6 (six) hours as needed for moderate pain.    Marland Kitchen ketoconazole (NIZORAL) 2 % shampoo Apply 1 application topically 2 (two) times a week. 120 mL 2   No current facility-administered medications for this visit.    SURGICAL HISTORY:  Past Surgical History:  Procedure Laterality Date  . CHOLECYSTECTOMY N/A 09/21/2019   Procedure: LAPAROSCOPIC CHOLECYSTECTOMY WITH INTRAOPERATIVE CHOLANGIOGRAM;  Surgeon: Erroll Luna, MD;  Location: [facility];  Service: General;   Laterality: N/A;    REVIEW OF SYSTEMS:  A comprehensive review of systems was negative.   PHYSICAL EXAMINATION: General appearance: alert, cooperative and no distress Head: Normocephalic, without obvious abnormality, atraumatic Neck: no adenopathy, no JVD, supple, symmetrical, trachea midline and thyroid not enlarged, symmetric, no tenderness/mass/nodules Lymph nodes: Cervical, supraclavicular, and axillary nodes normal. Resp: clear to auscultation bilaterally Back: symmetric, no curvature. ROM normal. No CVA tenderness. Cardio: regular rate and rhythm, S1, S2 normal, no murmur, click, rub or gallop GI: soft, non-tender; bowel sounds normal; no masses,  no organomegaly Extremities: extremities normal, atraumatic, no cyanosis or edema  ECOG PERFORMANCE STATUS: 1 - Symptomatic but completely ambulatory  Blood pressure (!) 120/93, pulse 74, temperature 98.2 F (36.8 C), temperature source Tympanic, resp. rate 18, height 4\' 11"  (1.499 m), weight 281 lb 11.2 oz (127.8 kg), SpO2 97 %.  LABORATORY DATA: Lab Results  Component Value Date   WBC 9.5 04/30/2020   HGB 12.1 04/30/2020   HCT 38.1 04/30/2020   MCV 76.5 (L) 04/30/2020   PLT 255 04/30/2020      Chemistry      Component Value Date/Time   NA 145 12/18/2019 1431   K 3.7 12/18/2019 1431   CL 107 12/18/2019 1431   CO2 27 12/18/2019 1431   BUN 9 12/18/2019 1431   CREATININE 0.82 12/18/2019 1431      Component Value Date/Time   CALCIUM 9.1 12/18/2019 1431   ALKPHOS 72 12/18/2019 1431   AST 66 (H) 12/18/2019 1431   ALT 15 12/18/2019 1431   BILITOT 0.6 12/18/2019 1431  RADIOGRAPHIC STUDIES: No results found.  ASSESSMENT AND PLAN: This is a very pleasant 49 years old African-American female with history of iron deficiency anemia secondary to menorrhagia.  The patient status post treatment with Venofer infusion weekly for 4 weeks. The patient is currently on oral iron tablet and she is tolerating it fairly well. She  had repeat CBC and iron study performed earlier today. Her CBC showed improvement in her hemoglobin and hematocrit to the normal range. Iron study and ferritin are still pending. I recommended for the patient to continue on the oral iron tablets for now. I will see her back for follow-up visit in 3 months for evaluation with repeat CBC, iron study and ferritin. The patient was advised to call immediately if she has any concerning symptoms in the interval. The patient voices understanding of current disease status and treatment options and is in agreement with the current care plan.  All questions were answered. The patient knows to call the clinic with any problems, questions or concerns. We can certainly see the patient much sooner if necessary.   Disclaimer: This note was dictated with voice recognition software. Similar sounding words can inadvertently be transcribed and may not be corrected upon review.

## 2020-05-22 ENCOUNTER — Other Ambulatory Visit: Payer: Self-pay | Admitting: Nurse Practitioner

## 2020-05-22 DIAGNOSIS — D500 Iron deficiency anemia secondary to blood loss (chronic): Secondary | ICD-10-CM

## 2020-05-22 NOTE — Telephone Encounter (Signed)
Last fill 02/15/20  #90/3

## 2020-07-05 HISTORY — PX: THROMBECTOMY: PRO61

## 2020-07-18 ENCOUNTER — Other Ambulatory Visit: Payer: No Typology Code available for payment source

## 2020-07-18 DIAGNOSIS — Z20822 Contact with and (suspected) exposure to covid-19: Secondary | ICD-10-CM | POA: Diagnosis not present

## 2020-07-20 LAB — NOVEL CORONAVIRUS, NAA: SARS-CoV-2, NAA: DETECTED — AB

## 2020-07-20 LAB — SARS-COV-2, NAA 2 DAY TAT

## 2020-07-29 ENCOUNTER — Inpatient Hospital Stay: Payer: No Typology Code available for payment source

## 2020-07-29 ENCOUNTER — Inpatient Hospital Stay: Payer: No Typology Code available for payment source | Admitting: Internal Medicine

## 2021-02-20 ENCOUNTER — Encounter: Payer: Self-pay | Admitting: Physician Assistant

## 2021-02-20 ENCOUNTER — Emergency Department (HOSPITAL_COMMUNITY)
Admission: EM | Admit: 2021-02-20 | Discharge: 2021-02-20 | Disposition: A | Discharge status: Other Acute Inpatient Hospital | Payer: 59 | Attending: Emergency Medicine | Admitting: Emergency Medicine

## 2021-02-20 ENCOUNTER — Emergency Department (HOSPITAL_COMMUNITY): Payer: 59

## 2021-02-20 DIAGNOSIS — Y900 Blood alcohol level of less than 20 mg/100 ml: Secondary | ICD-10-CM | POA: Diagnosis not present

## 2021-02-20 DIAGNOSIS — D649 Anemia, unspecified: Secondary | ICD-10-CM | POA: Insufficient documentation

## 2021-02-20 DIAGNOSIS — R000 Tachycardia, unspecified: Secondary | ICD-10-CM | POA: Diagnosis not present

## 2021-02-20 DIAGNOSIS — N9489 Other specified conditions associated with female genital organs and menstrual cycle: Secondary | ICD-10-CM | POA: Insufficient documentation

## 2021-02-20 DIAGNOSIS — R4701 Aphasia: Secondary | ICD-10-CM | POA: Diagnosis not present

## 2021-02-20 DIAGNOSIS — R410 Disorientation, unspecified: Secondary | ICD-10-CM | POA: Diagnosis present

## 2021-02-20 DIAGNOSIS — R29810 Facial weakness: Secondary | ICD-10-CM | POA: Diagnosis not present

## 2021-02-20 DIAGNOSIS — Z79899 Other long term (current) drug therapy: Secondary | ICD-10-CM | POA: Diagnosis not present

## 2021-02-20 DIAGNOSIS — R29711 NIHSS score 11: Secondary | ICD-10-CM | POA: Diagnosis not present

## 2021-02-20 DIAGNOSIS — I63412 Cerebral infarction due to embolism of left middle cerebral artery: Secondary | ICD-10-CM | POA: Diagnosis not present

## 2021-02-20 DIAGNOSIS — R4182 Altered mental status, unspecified: Secondary | ICD-10-CM | POA: Diagnosis not present

## 2021-02-20 DIAGNOSIS — I63512 Cerebral infarction due to unspecified occlusion or stenosis of left middle cerebral artery: Secondary | ICD-10-CM

## 2021-02-20 DIAGNOSIS — Z20822 Contact with and (suspected) exposure to covid-19: Secondary | ICD-10-CM | POA: Insufficient documentation

## 2021-02-20 DIAGNOSIS — R0902 Hypoxemia: Secondary | ICD-10-CM | POA: Diagnosis not present

## 2021-02-20 DIAGNOSIS — R404 Transient alteration of awareness: Secondary | ICD-10-CM | POA: Diagnosis not present

## 2021-02-20 DIAGNOSIS — I639 Cerebral infarction, unspecified: Secondary | ICD-10-CM | POA: Diagnosis not present

## 2021-02-20 DIAGNOSIS — R531 Weakness: Secondary | ICD-10-CM | POA: Diagnosis not present

## 2021-02-20 LAB — DIFFERENTIAL
Abs Immature Granulocytes: 0.06 10*3/uL (ref 0.00–0.07)
Basophils Absolute: 0 10*3/uL (ref 0.0–0.1)
Basophils Relative: 0 %
Eosinophils Absolute: 0 10*3/uL (ref 0.0–0.5)
Eosinophils Relative: 0 %
Immature Granulocytes: 1 %
Lymphocytes Relative: 10 %
Lymphs Abs: 1 10*3/uL (ref 0.7–4.0)
Monocytes Absolute: 0.4 10*3/uL (ref 0.1–1.0)
Monocytes Relative: 4 %
Neutro Abs: 8.7 10*3/uL — ABNORMAL HIGH (ref 1.7–7.7)
Neutrophils Relative %: 85 %

## 2021-02-20 LAB — RESP PANEL BY RT-PCR (FLU A&B, COVID) ARPGX2
Influenza A by PCR: NEGATIVE
Influenza B by PCR: NEGATIVE
SARS Coronavirus 2 by RT PCR: NEGATIVE

## 2021-02-20 LAB — CBC
HCT: 36.1 % (ref 36.0–46.0)
Hemoglobin: 11.2 g/dL — ABNORMAL LOW (ref 12.0–15.0)
MCH: 24.4 pg — ABNORMAL LOW (ref 26.0–34.0)
MCHC: 31 g/dL (ref 30.0–36.0)
MCV: 78.6 fL — ABNORMAL LOW (ref 80.0–100.0)
Platelets: 211 10*3/uL (ref 150–400)
RBC: 4.59 MIL/uL (ref 3.87–5.11)
RDW: 15.3 % (ref 11.5–15.5)
WBC: 10.1 10*3/uL (ref 4.0–10.5)
nRBC: 0 % (ref 0.0–0.2)

## 2021-02-20 LAB — ETHANOL: Alcohol, Ethyl (B): <10 mg/dL (ref <10)

## 2021-02-20 LAB — I-STAT CHEM 8, ED
BUN: 12 mg/dL (ref 6–20)
Calcium, Ion: 1.06 mmol/L — ABNORMAL LOW (ref 1.15–1.40)
Chloride: 103 mmol/L (ref 98–111)
Creatinine, Ser: 0.7 mg/dL (ref 0.44–1.00)
Glucose, Bld: 136 mg/dL — ABNORMAL HIGH (ref 70–99)
HCT: 34 % — ABNORMAL LOW (ref 36.0–46.0)
Hemoglobin: 11.6 g/dL — ABNORMAL LOW (ref 12.0–15.0)
Potassium: 3.6 mmol/L (ref 3.5–5.1)
Sodium: 138 mmol/L (ref 135–145)
TCO2: 22 mmol/L (ref 22–32)

## 2021-02-20 LAB — COMPREHENSIVE METABOLIC PANEL
ALT: 14 U/L (ref 0–44)
AST: 36 U/L (ref 15–41)
Albumin: 2.7 g/dL — ABNORMAL LOW (ref 3.5–5.0)
Alkaline Phosphatase: 59 U/L (ref 38–126)
Anion gap: 7 (ref 5–15)
BUN: 12 mg/dL (ref 6–20)
CO2: 22 mmol/L (ref 22–32)
Calcium: 8.2 mg/dL — ABNORMAL LOW (ref 8.9–10.3)
Chloride: 105 mmol/L (ref 98–111)
Creatinine, Ser: 0.85 mg/dL (ref 0.44–1.00)
GFR, Estimated: >60 mL/min (ref >60)
Glucose, Bld: 139 mg/dL — ABNORMAL HIGH (ref 70–99)
Potassium: 3.6 mmol/L (ref 3.5–5.1)
Sodium: 134 mmol/L — ABNORMAL LOW (ref 135–145)
Total Bilirubin: 0.3 mg/dL (ref 0.3–1.2)
Total Protein: 5.6 g/dL — ABNORMAL LOW (ref 6.5–8.1)

## 2021-02-20 LAB — PROTIME-INR
INR: 1.1 (ref 0.8–1.2)
Prothrombin Time: 14 seconds (ref 11.4–15.2)

## 2021-02-20 LAB — I-STAT BETA HCG BLOOD, ED (MC, WL, AP ONLY): I-stat hCG, quantitative: <5 m[IU]/mL (ref <5)

## 2021-02-20 LAB — CBG MONITORING, ED: Glucose-Capillary: 157 mg/dL — ABNORMAL HIGH (ref 70–99)

## 2021-02-20 LAB — APTT: aPTT: 27 seconds (ref 24–36)

## 2021-02-20 MED ORDER — IOHEXOL 350 MG/ML SOLN
100.0000 mL | Freq: Once | INTRAVENOUS | Status: AC | PRN
  Administered 2021-02-20: 100 mL via INTRAVENOUS

## 2021-02-20 NOTE — ED Triage Notes (Signed)
Pt bib gems as code stroke. LKW 1800. Pts daughter found her confused in her car at approx 0300. Pt presents with ams and aphasia. No thinners, no recent falls.   BP: 140/90

## 2021-02-20 NOTE — Consult Note (Signed)
30-40 mins ago, stuff about pharmacy, pulling arm to come outside and saying I do not know. Drove down to her. Car in neutral, did not know how to turn it off.   NEUROLOGY CONSULTATION NOTE   Date of service: February 20, 2021 Patient Name: Renee Hahn MRN:  [id number] DOB:  1970/09/20 Reason for consult: "Aphasia" Requesting Provider: Deno Etienne, DO _ _ _   _ __   _ __ _ _  __ __   _ __   __ _  History of Present Illness  Renee Hahn is a 50 y.o. female with PMH significant for hay fever, obesity who presents with Aphasia. She was talking to her sister fine at 1800 on 02/19/21. She all of a sudden pulled up to her sister's door in the middle of the night. Looks like she drove down to her house. She left her car in neutral. She was unable to talk. Kept saying "I don't know".  mRS: 0 tPA: outside window Thrombectomy: L MCA M2 occlusion. Discussed with Dr. Marlise Eves at [facility] and patient was transferred to [facility] by EMS. NIHSS components Score: Comment  1a Level of Conscious 0'[x]'$  1'[]'$  2'[]'$  3'[]'$      1b LOC Questions 0'[]'$  1'[]'$  2'[x]'$       1c LOC Commands 0'[x]'$  1'[]'$  2'[]'$       2 Best Gaze 0'[x]'$  1'[]'$  2'[]'$       3 Visual 0'[x]'$  1'[]'$  2'[]'$  3'[]'$      4 Facial Palsy 0'[x]'$  1'[]'$  2'[]'$  3'[]'$      5a Motor Arm - left 0'[x]'$  1'[]'$  2'[]'$  3'[]'$  4'[]'$  UN'[]'$    5b Motor Arm - Right 0'[x]'$  1'[]'$  2'[]'$  3'[]'$  4'[]'$  UN'[]'$    6a Motor Leg - Left 0'[x]'$  1'[]'$  2'[]'$  3'[]'$  4'[]'$  UN'[]'$    6b Motor Leg - Right 0'[x]'$  1'[]'$  2'[]'$  3'[]'$  4'[]'$  UN'[]'$    7 Limb Ataxia 0'[x]'$  1'[]'$  2'[]'$  3'[]'$  UN'[]'$     8 Sensory 0'[x]'$  1'[]'$  2'[]'$  UN'[]'$      9 Best Language 0'[]'$  1'[]'$  2'[x]'$  3'[]'$      10 Dysarthria 0'[x]'$  1'[]'$  2'[]'$  UN'[]'$      11 Extinct. and Inattention 0'[x]'$  1'[]'$  2'[]'$       TOTAL: 4      ROS   Unable to obtain 2/2 aphasia.  Past History   Past Medical History:  Diagnosis Date  . Hay fever    Past Surgical History:  Procedure Laterality Date  . CHOLECYSTECTOMY N/A 09/21/2019   Procedure: LAPAROSCOPIC CHOLECYSTECTOMY WITH INTRAOPERATIVE CHOLANGIOGRAM;  Surgeon: Erroll Luna, MD;  Location: [facility]  [facility];  Service: General;  Laterality: N/A;   Family History  Problem Relation Age of Onset  . Cancer Other   . Cancer Mother        breast  . Cancer Father        protaste  . Hypertension Maternal Grandmother   . Hypertension Paternal Grandfather    Social History   Socioeconomic History  . Marital status: Widowed    Spouse name: Not on file  . Number of children: Not on file  . Years of education: Not on file  . Highest education level: Not on file  Occupational History  . Not on file  Tobacco Use  . Smoking status: Never  . Smokeless tobacco: Never  Substance and Sexual Activity  . Alcohol use: Yes    Comment: socially  . Drug use: No  . Sexual activity: Not on file  Other Topics Concern  . Not on file  Social History Narrative  . Not on file  Social Determinants of Health   Financial Resource Strain: Not on file  Food Insecurity: Not on file  Transportation Needs: Not on file  Physical Activity: Not on file  Stress: Not on file  Social Connections: Not on file   No Known Allergies  Medications  (Not in a hospital admission)    Vitals   Vitals:   02/20/21 0330  BP: (!) 152/106  Pulse: 71  Resp: 20  Temp: 97.9 F (36.6 C)  SpO2: 100%     There is no height or weight on file to calculate BMI.  Physical Exam   General: Laying comfortably in bed; in no acute distress.  HENT: Normal oropharynx and mucosa. Normal external appearance of ears and nose.  Neck: Supple, no pain or tenderness  CV: No JVD. No peripheral edema.  Pulmonary: Symmetric Chest rise. Normal respiratory effort.  Abdomen: Soft to touch, non-tender.  Ext: No cyanosis, edema, or deformity  Skin: No rash. Normal palpation of skin.   Musculoskeletal: Normal digits and nails by inspection. No clubbing.   Neurologic Examination  Mental status/Cognition: Alert, awake, following commands. Unable to assess orientation secondary to expressive aphasia. Good attention. Speech/language:  Non fluent, comprehension intact to simple commands, unable to name objects, unable to repeat. Cranial nerves:   CN II Pupils equal and reactive to light, no VF deficits   CN III,IV,VI EOM intact, no gaze preference or deviation, no nystagmus   CN V normal sensation in V1, V2, and V3 segments bilaterally    CN VII no asymmetry, no nasolabial fold flattening    CN VIII normal hearing to speech    CN IX & X normal palatal elevation, no uvular deviation    CN XI    CN XII midline tongue protrusion    Motor:  Muscle bulk: normal, tone normal  Mvmt Root Nerve  Muscle Right Left Comments  SA C5/6 Ax Deltoid 5 5   EF C5/6 Mc Biceps 5 5   EE C6/7/8 Rad Triceps 5 5   WF C6/7 Med FCR     WE C7/8 PIN ECU     F Ab C8/T1 U ADM/FDI 5 5   HF L1/2/3 Fem Illopsoas 5 5   KE L2/3/4 Fem Quad 5 5   DF L4/5 D Peron Tib Ant     PF S1/2 Tibial Grc/Sol      Sensation:  Light touch Intact throughout   Pin prick    Temperature    Vibration   Proprioception    Coordination/Complex Motor:  - Finger to Nose intact BL - Heel to shin unable to do 2/2 obesity. - Rapid alternating movement are normal - Gait: Deferred.  Labs   CBC: No results for input(s): WBC, NEUTROABS, HGB, HCT, MCV, PLT in the last 168 hours.  Basic Metabolic Panel:  Lab Results  Component Value Date   NA 145 12/18/2019   K 3.7 12/18/2019   CO2 27 12/18/2019   GLUCOSE 89 12/18/2019   BUN 9 12/18/2019   CREATININE 0.82 12/18/2019   CALCIUM 9.1 12/18/2019   GFRNONAA >60 12/18/2019   GFRAA >60 12/18/2019   Lipid Panel:  Lab Results  Component Value Date   LDLCALC 87 01/24/2020   HgbA1c: No results found for: HGBA1C Urine Drug Screen: No results found for: LABOPIA, COCAINSCRNUR, LABBENZ, AMPHETMU, THCU, LABBARB  Alcohol Level No results found for: [place]  CT Head without contrast: Personally reviewed with an evolving L MCA infarct and ASPECTS of 9.  CT angio  Head and Neck with contrast: Personally reviewed and L MCA M2  occlusion   Impression   Renee Hahn is a 50 y.o. female with PMH significant for hay fever, obesity who presents with Aphasia. She was found to have developing L MCA stroke with L MCA M2 occlusion. She is outside tPA window at presentation. Unfortunately our thrombectomy suite was in use so case was discussed with Dr. Marlise Eves with [facility] and she was transferred to [facility] by EMS.  She left [facility] at 0411.  Primary Diagnosis:  Cerebral infarction due to embolism of  left middle cerebral artery.   Secondary Diagnosis: Obesity  Recommendations  - Further stroke workup and treatment per neurology and interventional radiology team at [facility].  This patient is critically ill and at significant risk of neurological worsening, death and care requires constant monitoring of vital signs, hemodynamics,respiratory and cardiac monitoring, neurological assessment, discussion with family, other specialists and medical decision making of high complexity. I spent 60 minutes of neurocritical care time  in the care of  this patient. This was time spent independent of any time provided by nurse practitioner or PA.  [place] Pager Number [phone] 02/20/2021  4:49 AM  ______________________________________________________________________   Thank you for the opportunity to take part in the care of this patient. If you have any further questions, please contact the neurology consultation attending.  Signed,  [place] Pager Number [phone] _ _ _   _ __   _ __ _ _  __ __   _ __   __ _

## 2021-02-20 NOTE — ED Notes (Signed)
Pt on EMS stretcher with EMS and rapid RN waiting on transfer.

## 2021-02-20 NOTE — Code Documentation (Signed)
Responded to code stroke. Called at [place] for [place], aphasia. LKW 1800. Pt arrived at 0323, CBG 157, NIH 4, CT head negative for bleed, CTA- L MCA occlusion. TPA not given- pt outside window. Treatment decision time at 0344 to transfer pt to [facility] for IR procedure. Transported by EMS at [place].

## 2021-02-20 NOTE — ED Provider Notes (Signed)
[facility] EMERGENCY DEPARTMENT Provider Note   CSN: [id number] Arrival date & time: 02/20/21  [id number]  An emergency department physician performed an initial assessment on this suspected stroke patient at 0323.  History Chief Complaint  Patient presents with   Code Stroke    Renee Hahn is a 50 y.o. female.  50 yo F with a chief complaints of confusion.  This reportedly started this evening last seen well about 6 PM.  Family found her confused.  Patient with aphasia with EMS arrived as a code stroke.  Level 5 caveat acuity of condition.  The history is provided by the patient and the EMS personnel.      Past Medical History:  Diagnosis Date   Hay fever     Patient Active Problem List   Diagnosis Date Noted   BMI 50.0-59.9, adult ([place]) 01/28/2020   Morbid obesity ([place]) 01/24/2020   Menorrhagia 12/18/2019   Iron deficiency anemia due to chronic blood loss 12/17/2019   Seborrhea 12/17/2019   S/P laparoscopic cholecystectomy 09/21/2019    Past Surgical History:  Procedure Laterality Date   CHOLECYSTECTOMY N/A 09/21/2019   Procedure: LAPAROSCOPIC CHOLECYSTECTOMY WITH INTRAOPERATIVE CHOLANGIOGRAM;  Surgeon: Erroll Luna, MD;  Location: [facility];  Service: General;  Laterality: N/A;     OB History   No obstetric history on file.     Family History  Problem Relation Age of Onset   Cancer Other    Cancer Mother        breast   Cancer Father        protaste   Hypertension Maternal Grandmother    Hypertension Paternal Grandfather     Social History   Tobacco Use   Smoking status: Never   Smokeless tobacco: Never  Substance Use Topics   Alcohol use: Yes    Comment: socially   Drug use: No    Home Medications Prior to Admission medications   Medication Sig Start Date End Date Taking? Authorizing Provider  clotrimazole-betamethasone (LOTRISONE) cream Apply 1 application topically 2 (two) times daily. 12/17/19   Nche, [name], NP   ferrous gluconate (FERGON) 324 MG tablet TAKE 1 TABLET (324 MG TOTAL) BY MOUTH 3 (THREE) TIMES DAILY WITH MEALS. 02/15/20   Nche, [name], NP  ibuprofen (ADVIL,MOTRIN) 200 MG tablet Take 400 mg by mouth every 6 (six) hours as needed for moderate pain.    [provider]  ketoconazole (NIZORAL) 2 % shampoo Apply 1 application topically 2 (two) times a week. 12/17/19   Nche, [name], NP    Allergies    Patient has no known allergies.  Review of Systems   Review of Systems  Unable to perform ROS: Acuity of condition  Neurological:  Positive for speech difficulty.   Physical Exam Updated Vital Signs BP 130/70 (BP Location: Right Wrist)   Pulse (!) 104   Temp 97.9 F (36.6 C)   Resp 18   SpO2 100%   Physical Exam Vitals and nursing note reviewed.  Constitutional:      General: She is not in acute distress.    Appearance: She is well-developed. She is not diaphoretic.  HENT:     Head: Normocephalic and atraumatic.  Eyes:     Pupils: Pupils are equal, round, and reactive to light.  Cardiovascular:     Rate and Rhythm: Normal rate and regular rhythm.     Heart sounds: No murmur heard.   No friction rub. No gallop.  Pulmonary:  Effort: Pulmonary effort is normal.     Breath sounds: No wheezing or rales.  Abdominal:     General: There is no distension.     Palpations: Abdomen is soft.     Tenderness: There is no abdominal tenderness.  Musculoskeletal:        General: No tenderness.     Cervical back: Normal range of motion and neck supple.  Skin:    General: Skin is warm and dry.  Neurological:     Mental Status: She is alert.     Comments: Confused response  Psychiatric:        Behavior: Behavior normal.    ED Results / Procedures / Treatments   Labs (all labs ordered are listed, but only abnormal results are displayed) Labs Reviewed  I-STAT CHEM 8, ED - Abnormal; Notable for the following components:      Result Value   Glucose, Bld 136 (*)     Calcium, Ion 1.06 (*)    Hemoglobin 11.6 (*)    HCT 34.0 (*)    All other components within normal limits  CBG MONITORING, ED - Abnormal; Notable for the following components:   Glucose-Capillary 157 (*)    All other components within normal limits  RESP PANEL BY RT-PCR (FLU A&B, COVID) ARPGX2  ETHANOL  PROTIME-INR  APTT  CBC  DIFFERENTIAL  COMPREHENSIVE METABOLIC PANEL  RAPID URINE DRUG SCREEN, HOSP PERFORMED  URINALYSIS, ROUTINE W REFLEX MICROSCOPIC  I-STAT BETA HCG BLOOD, ED ([facility], [facility], [facility] ONLY)    EKG None  Radiology CT HEAD CODE STROKE WO CONTRAST  Result Date: 02/20/2021 CLINICAL DATA:  Neuro deficit, acute, stroke suspected; Stroke/TIA, assess intracranial arteries; Stroke/TIA, assess extracranial arteries EXAM: CT HEAD WITHOUT CONTRAST CT ANGIOGRAPHY OF THE HEAD AND NECK TECHNIQUE: Contiguous axial images were obtained from the base of the skull through the vertex without intravenous contrast. Multidetector CT imaging of the head and neck was performed using the standard protocol during bolus administration of intravenous contrast. Multiplanar CT image reconstructions and MIPs were obtained to evaluate the vascular anatomy. Carotid stenosis measurements (when applicable) are obtained utilizing NASCET criteria, using the distal internal carotid diameter as the denominator. COMPARISON:  None. FINDINGS: CT HEAD FINDINGS Brain: There is no mass, hemorrhage or extra-axial collection. The size and configuration of the ventricles and extra-axial CSF spaces are normal. There is an area of hypoattenuation in the posterior left insula the brain parenchyma is normal. Skull: The visualized skull base, calvarium and extracranial soft tissues are normal. Sinuses/Orbits: No fluid levels or advanced mucosal thickening of the visualized paranasal sinuses. No mastoid or middle ear effusion. The orbits are normal. ASPECTS ([place] Stroke Program Early CT Score) - Ganglionic level infarction (caudate,  lentiform nuclei, internal capsule, insula, M1-M3 cortex): 6 - Supraganglionic infarction (M4-M6 cortex): 3 Total score (0-10 with 10 being normal): 9 Review of the MIP images confirms the above findings CTA NECK FINDINGS SKELETON: There is no bony spinal canal stenosis. No lytic or blastic lesion. OTHER NECK: Normal pharynx, larynx and major salivary glands. No cervical lymphadenopathy. Unremarkable thyroid gland. UPPER CHEST: No pneumothorax or pleural effusion. No nodules or masses. AORTIC ARCH: There is no calcific atherosclerosis of the aortic arch. There is no aneurysm, dissection or hemodynamically significant stenosis of the visualized portion of the aorta. Conventional 3 vessel aortic branching pattern. The visualized proximal subclavian arteries are widely patent. RIGHT CAROTID SYSTEM: Normal without aneurysm, dissection or stenosis. LEFT CAROTID SYSTEM: Normal without aneurysm, dissection or stenosis.  VERTEBRAL ARTERIES: Left dominant configuration. Both origins are clearly patent. There is no dissection, occlusion or flow-limiting stenosis to the skull base (V1-V3 segments). CTA HEAD FINDINGS POSTERIOR CIRCULATION: --Vertebral arteries: Normal V4 segments. --Inferior cerebellar arteries: Normal. --Basilar artery: Normal. --Superior cerebellar arteries: Normal. --Posterior cerebral arteries (PCA): Normal. ANTERIOR CIRCULATION: --Intracranial internal carotid arteries: Normal. --Anterior cerebral arteries (ACA): Normal. Both A1 segments are present. Patent anterior communicating artery (a-comm). --Middle cerebral arteries (MCA): There is occlusion of a left MCA M2 segment branch (series 12, image 22 and series 7 images 97-102). The right MCA is normal. VENOUS SINUSES: As permitted by contrast timing, patent. ANATOMIC VARIANTS: None Review of the MIP images confirms the above findings. IMPRESSION: 1. No acute hemorrhage. ASPECTS is 9 due to early ischemic changes of the left insula. 2. Occlusion of a left  MCA M2 segment branch. Critical Value/emergent results were called by telephone at the time of interpretation on 02/20/2021 at 3:40 AM to provider [facility] , who verbally acknowledged these results. Electronically Signed   By: Ulyses Jarred M.D.   On: 02/20/2021 04:01   CT ANGIO HEAD CODE STROKE  Result Date: 02/20/2021 CLINICAL DATA:  Neuro deficit, acute, stroke suspected; Stroke/TIA, assess intracranial arteries; Stroke/TIA, assess extracranial arteries EXAM: CT HEAD WITHOUT CONTRAST CT ANGIOGRAPHY OF THE HEAD AND NECK TECHNIQUE: Contiguous axial images were obtained from the base of the skull through the vertex without intravenous contrast. Multidetector CT imaging of the head and neck was performed using the standard protocol during bolus administration of intravenous contrast. Multiplanar CT image reconstructions and MIPs were obtained to evaluate the vascular anatomy. Carotid stenosis measurements (when applicable) are obtained utilizing NASCET criteria, using the distal internal carotid diameter as the denominator. COMPARISON:  None. FINDINGS: CT HEAD FINDINGS Brain: There is no mass, hemorrhage or extra-axial collection. The size and configuration of the ventricles and extra-axial CSF spaces are normal. There is an area of hypoattenuation in the posterior left insula the brain parenchyma is normal. Skull: The visualized skull base, calvarium and extracranial soft tissues are normal. Sinuses/Orbits: No fluid levels or advanced mucosal thickening of the visualized paranasal sinuses. No mastoid or middle ear effusion. The orbits are normal. ASPECTS ([place] Stroke Program Early CT Score) - Ganglionic level infarction (caudate, lentiform nuclei, internal capsule, insula, M1-M3 cortex): 6 - Supraganglionic infarction (M4-M6 cortex): 3 Total score (0-10 with 10 being normal): 9 Review of the MIP images confirms the above findings CTA NECK FINDINGS SKELETON: There is no bony spinal canal stenosis. No  lytic or blastic lesion. OTHER NECK: Normal pharynx, larynx and major salivary glands. No cervical lymphadenopathy. Unremarkable thyroid gland. UPPER CHEST: No pneumothorax or pleural effusion. No nodules or masses. AORTIC ARCH: There is no calcific atherosclerosis of the aortic arch. There is no aneurysm, dissection or hemodynamically significant stenosis of the visualized portion of the aorta. Conventional 3 vessel aortic branching pattern. The visualized proximal subclavian arteries are widely patent. RIGHT CAROTID SYSTEM: Normal without aneurysm, dissection or stenosis. LEFT CAROTID SYSTEM: Normal without aneurysm, dissection or stenosis. VERTEBRAL ARTERIES: Left dominant configuration. Both origins are clearly patent. There is no dissection, occlusion or flow-limiting stenosis to the skull base (V1-V3 segments). CTA HEAD FINDINGS POSTERIOR CIRCULATION: --Vertebral arteries: Normal V4 segments. --Inferior cerebellar arteries: Normal. --Basilar artery: Normal. --Superior cerebellar arteries: Normal. --Posterior cerebral arteries (PCA): Normal. ANTERIOR CIRCULATION: --Intracranial internal carotid arteries: Normal. --Anterior cerebral arteries (ACA): Normal. Both A1 segments are present. Patent anterior communicating artery (a-comm). --Middle cerebral arteries (MCA): There is  occlusion of a left MCA M2 segment branch (series 12, image 22 and series 7 images 97-102). The right MCA is normal. VENOUS SINUSES: As permitted by contrast timing, patent. ANATOMIC VARIANTS: None Review of the MIP images confirms the above findings. IMPRESSION: 1. No acute hemorrhage. ASPECTS is 9 due to early ischemic changes of the left insula. 2. 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 Critical Value/emergent results were called by telephone at the time of interpretation on 02/20/2021 at 3:40 AM to provider [facility] , who verbally acknowledged these results. Electronically Signed   By: Ulyses Jarred M.D.   On: 02/20/2021 04:01    CT ANGIO NECK CODE STROKE  Result Date: 02/20/2021 CLINICAL DATA:  Neuro deficit, acute, stroke suspected; Stroke/TIA, assess intracranial arteries; Stroke/TIA, assess extracranial arteries EXAM: CT HEAD WITHOUT CONTRAST CT ANGIOGRAPHY OF THE HEAD AND NECK TECHNIQUE: Contiguous axial images were obtained from the base of the skull through the vertex without intravenous contrast. Multidetector CT imaging of the head and neck was performed using the standard protocol during bolus administration of intravenous contrast. Multiplanar CT image reconstructions and MIPs were obtained to evaluate the vascular anatomy. Carotid stenosis measurements (when applicable) are obtained utilizing NASCET criteria, using the distal internal carotid diameter as the denominator. COMPARISON:  None. FINDINGS: CT HEAD FINDINGS Brain: There is no mass, hemorrhage or extra-axial collection. The size and configuration of the ventricles and extra-axial CSF spaces are normal. There is an area of hypoattenuation in the posterior left insula the brain parenchyma is normal. Skull: The visualized skull base, calvarium and extracranial soft tissues are normal. Sinuses/Orbits: No fluid levels or advanced mucosal thickening of the visualized paranasal sinuses. No mastoid or middle ear effusion. The orbits are normal. ASPECTS ([place] Stroke Program Early CT Score) - Ganglionic level infarction (caudate, lentiform nuclei, internal capsule, insula, M1-M3 cortex): 6 - Supraganglionic infarction (M4-M6 cortex): 3 Total score (0-10 with 10 being normal): 9 Review of the MIP images confirms the above findings CTA NECK FINDINGS SKELETON: There is no bony spinal canal stenosis. No lytic or blastic lesion. OTHER NECK: Normal pharynx, larynx and major salivary glands. No cervical lymphadenopathy. Unremarkable thyroid gland. UPPER CHEST: No pneumothorax or pleural effusion. No nodules or masses. AORTIC ARCH: There is no calcific atherosclerosis of the  aortic arch. There is no aneurysm, dissection or hemodynamically significant stenosis of the visualized portion of the aorta. Conventional 3 vessel aortic branching pattern. The visualized proximal subclavian arteries are widely patent. RIGHT CAROTID SYSTEM: Normal without aneurysm, dissection or stenosis. LEFT CAROTID SYSTEM: Normal without aneurysm, dissection or stenosis. VERTEBRAL ARTERIES: Left dominant configuration. Both origins are clearly patent. There is no dissection, occlusion or flow-limiting stenosis to the skull base (V1-V3 segments). CTA HEAD FINDINGS POSTERIOR CIRCULATION: --Vertebral arteries: Normal V4 segments. --Inferior cerebellar arteries: Normal. --Basilar artery: Normal. --Superior cerebellar arteries: Normal. --Posterior cerebral arteries (PCA): Normal. ANTERIOR CIRCULATION: --Intracranial internal carotid arteries: Normal. --Anterior cerebral arteries (ACA): Normal. Both A1 segments are present. Patent anterior communicating artery (a-comm). --Middle cerebral arteries (MCA): There is occlusion of a left MCA M2 segment branch (series 12, image 22 and series 7 images 97-102). The right MCA is normal. VENOUS SINUSES: As permitted by contrast timing, patent. ANATOMIC VARIANTS: None Review of the MIP images confirms the above findings. IMPRESSION: 1. No acute hemorrhage. ASPECTS is 9 due to early ischemic changes of the left insula. 2. 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 Critical Value/emergent results were called by telephone at the time  of interpretation on 02/20/2021 at 3:40 AM to provider [facility] , who verbally acknowledged these results. Electronically Signed   By: Ulyses Jarred M.D.   On: 02/20/2021 04:01    Procedures Procedures   Medications Ordered in ED Medications  iohexol (OMNIPAQUE) 350 MG/ML injection 100 mL (100 mLs Intravenous Contrast Given 02/20/21 0352)    ED Course  I have reviewed the triage vital signs and the nursing notes.  Pertinent labs &  imaging results that were available during my care of the patient were reviewed by me and considered in my medical decision making (see chart for details).    MDM Rules/Calculators/A&P                           50 yo F with a chief complaints of acute onset confusion.  Arrived as a code stroke.  Airway clear to the bridge.  Taken urgently to CT.  CT angiogram concerning for left MCA occlusion.  Intervention not available at this facility will be transferred to [facility] in [place].  CRITICAL CARE Performed by: Cecilio Asper   Total critical care time: 35 minutes  Critical care time was exclusive of separately billable procedures and treating other patients.  Critical care was necessary to treat or prevent imminent or life-threatening deterioration.  Critical care was time spent personally by me on the following activities: development of treatment plan with patient and/or surrogate as well as nursing, discussions with consultants, evaluation of patient's response to treatment, examination of patient, obtaining history from patient or surrogate, ordering and performing treatments and interventions, ordering and review of laboratory studies, ordering and review of radiographic studies, pulse oximetry and re-evaluation of patient's condition.  The patients results and plan were reviewed and discussed.   Any x-rays performed were independently reviewed by myself.   Differential diagnosis were considered with the presenting HPI.  Medications  iohexol (OMNIPAQUE) 350 MG/ML injection 100 mL (100 mLs Intravenous Contrast Given 02/20/21 0352)    Vitals:   02/20/21 0330 02/20/21 0345 02/20/21 0352  BP: (!) 152/106 (!) 146/106 130/70  Pulse: 71 (!) 108 (!) 104  Resp: '20 16 18  '$ Temp: 97.9 F (36.6 C)    SpO2: 100% 100% 100%    Final diagnoses:  Arterial ischemic stroke, MCA (middle cerebral artery), left, acute (HCC)  Cerebrovascular accident (CVA) due to  occlusion of left middle cerebral artery ([place])     Final Clinical Impression(s) / ED Diagnoses Final diagnoses:  Arterial ischemic stroke, MCA (middle cerebral artery), left, acute ([place])  Cerebrovascular accident (CVA) due to occlusion of left middle cerebral artery [facility])    Rx / DC Orders ED Discharge Orders     None        Deno Etienne, DO 02/20/21 0406

## 2021-02-21 DIAGNOSIS — I63512 Cerebral infarction due to unspecified occlusion or stenosis of left middle cerebral artery: Secondary | ICD-10-CM | POA: Diagnosis not present

## 2021-02-21 DIAGNOSIS — D649 Anemia, unspecified: Secondary | ICD-10-CM | POA: Diagnosis not present

## 2021-02-21 DIAGNOSIS — I639 Cerebral infarction, unspecified: Secondary | ICD-10-CM | POA: Diagnosis not present

## 2021-02-21 DIAGNOSIS — I071 Rheumatic tricuspid insufficiency: Secondary | ICD-10-CM | POA: Diagnosis not present

## 2021-02-21 DIAGNOSIS — I10 Essential (primary) hypertension: Secondary | ICD-10-CM | POA: Diagnosis not present

## 2021-02-21 DIAGNOSIS — N3001 Acute cystitis with hematuria: Secondary | ICD-10-CM | POA: Diagnosis not present

## 2021-02-21 DIAGNOSIS — Z9889 Other specified postprocedural states: Secondary | ICD-10-CM | POA: Diagnosis not present

## 2021-02-21 DIAGNOSIS — I63412 Cerebral infarction due to embolism of left middle cerebral artery: Secondary | ICD-10-CM | POA: Diagnosis not present

## 2021-02-22 DIAGNOSIS — I639 Cerebral infarction, unspecified: Secondary | ICD-10-CM | POA: Diagnosis not present

## 2021-02-22 DIAGNOSIS — I63512 Cerebral infarction due to unspecified occlusion or stenosis of left middle cerebral artery: Secondary | ICD-10-CM | POA: Diagnosis not present

## 2021-02-22 DIAGNOSIS — I10 Essential (primary) hypertension: Secondary | ICD-10-CM | POA: Diagnosis not present

## 2021-02-22 DIAGNOSIS — I63412 Cerebral infarction due to embolism of left middle cerebral artery: Secondary | ICD-10-CM | POA: Diagnosis not present

## 2021-02-22 DIAGNOSIS — N3001 Acute cystitis with hematuria: Secondary | ICD-10-CM | POA: Diagnosis not present

## 2021-02-23 DIAGNOSIS — I639 Cerebral infarction, unspecified: Secondary | ICD-10-CM | POA: Diagnosis not present

## 2021-02-23 DIAGNOSIS — R4701 Aphasia: Secondary | ICD-10-CM | POA: Diagnosis not present

## 2021-02-23 DIAGNOSIS — I63412 Cerebral infarction due to embolism of left middle cerebral artery: Secondary | ICD-10-CM | POA: Diagnosis not present

## 2021-02-24 DIAGNOSIS — I63412 Cerebral infarction due to embolism of left middle cerebral artery: Secondary | ICD-10-CM | POA: Diagnosis not present

## 2021-02-24 DIAGNOSIS — R4701 Aphasia: Secondary | ICD-10-CM | POA: Diagnosis not present

## 2021-02-24 DIAGNOSIS — I639 Cerebral infarction, unspecified: Secondary | ICD-10-CM | POA: Diagnosis not present

## 2021-02-25 DIAGNOSIS — I639 Cerebral infarction, unspecified: Secondary | ICD-10-CM | POA: Diagnosis not present

## 2021-02-25 DIAGNOSIS — I63412 Cerebral infarction due to embolism of left middle cerebral artery: Secondary | ICD-10-CM | POA: Diagnosis not present

## 2021-02-26 DIAGNOSIS — I639 Cerebral infarction, unspecified: Secondary | ICD-10-CM | POA: Diagnosis not present

## 2021-02-26 DIAGNOSIS — I63412 Cerebral infarction due to embolism of left middle cerebral artery: Secondary | ICD-10-CM | POA: Diagnosis not present

## 2021-02-27 DIAGNOSIS — E538 Deficiency of other specified B group vitamins: Secondary | ICD-10-CM

## 2021-02-27 DIAGNOSIS — I639 Cerebral infarction, unspecified: Secondary | ICD-10-CM | POA: Diagnosis not present

## 2021-02-27 HISTORY — DX: Deficiency of other specified B group vitamins: E53.8

## 2021-02-28 DIAGNOSIS — I639 Cerebral infarction, unspecified: Secondary | ICD-10-CM | POA: Diagnosis not present

## 2021-02-28 DIAGNOSIS — I63412 Cerebral infarction due to embolism of left middle cerebral artery: Secondary | ICD-10-CM | POA: Diagnosis not present

## 2021-03-01 DIAGNOSIS — I639 Cerebral infarction, unspecified: Secondary | ICD-10-CM | POA: Diagnosis not present

## 2021-03-02 DIAGNOSIS — I63512 Cerebral infarction due to unspecified occlusion or stenosis of left middle cerebral artery: Secondary | ICD-10-CM | POA: Diagnosis not present

## 2021-03-02 DIAGNOSIS — I6930 Unspecified sequelae of cerebral infarction: Secondary | ICD-10-CM | POA: Insufficient documentation

## 2021-03-02 DIAGNOSIS — I639 Cerebral infarction, unspecified: Secondary | ICD-10-CM | POA: Diagnosis not present

## 2021-03-02 DIAGNOSIS — I63412 Cerebral infarction due to embolism of left middle cerebral artery: Secondary | ICD-10-CM | POA: Diagnosis not present

## 2021-03-02 DIAGNOSIS — E538 Deficiency of other specified B group vitamins: Secondary | ICD-10-CM | POA: Diagnosis not present

## 2021-03-04 DIAGNOSIS — Z8673 Personal history of transient ischemic attack (TIA), and cerebral infarction without residual deficits: Secondary | ICD-10-CM | POA: Diagnosis not present

## 2021-03-05 ENCOUNTER — Other Ambulatory Visit: Payer: Self-pay

## 2021-03-06 ENCOUNTER — Encounter: Payer: Self-pay | Admitting: Nurse Practitioner

## 2021-03-06 ENCOUNTER — Ambulatory Visit (INDEPENDENT_AMBULATORY_CARE_PROVIDER_SITE_OTHER): Payer: 59 | Admitting: Nurse Practitioner

## 2021-03-06 VITALS — BP 120/86 | HR 86 | Temp 97.9°F | Wt 271.2 lb

## 2021-03-06 DIAGNOSIS — I6930 Unspecified sequelae of cerebral infarction: Secondary | ICD-10-CM

## 2021-03-06 DIAGNOSIS — D500 Iron deficiency anemia secondary to blood loss (chronic): Secondary | ICD-10-CM

## 2021-03-06 LAB — CBC
HCT: 40.2 % (ref 36.0–46.0)
Hemoglobin: 12.6 g/dL (ref 12.0–15.0)
MCHC: 31.3 g/dL (ref 30.0–36.0)
MCV: 76.4 fl — ABNORMAL LOW (ref 78.0–100.0)
Platelets: 307 10*3/uL (ref 150.0–400.0)
RBC: 5.26 Mil/uL — ABNORMAL HIGH (ref 3.87–5.11)
RDW: 16.8 % — ABNORMAL HIGH (ref 11.5–15.5)
WBC: 9.6 10*3/uL (ref 4.0–10.5)

## 2021-03-06 LAB — BASIC METABOLIC PANEL
BUN: 12 mg/dL (ref 6–23)
CO2: 30 mEq/L (ref 19–32)
Calcium: 9.7 mg/dL (ref 8.4–10.5)
Chloride: 105 mEq/L (ref 96–112)
Creatinine, Ser: 0.8 mg/dL (ref 0.40–1.20)
GFR: 87.17 mL/min (ref >60.00)
Glucose, Bld: 88 mg/dL (ref 70–99)
Potassium: 5.2 mEq/L — ABNORMAL HIGH (ref 3.5–5.1)
Sodium: 142 mEq/L (ref 135–145)

## 2021-03-06 NOTE — Patient Instructions (Signed)
Maintain current medications Call neurologist office if you have not heard about heart monitor in 2weeks  Go to lab for blood draw.

## 2021-03-06 NOTE — Progress Notes (Signed)
Subjective:  Patient ID: Renee Hahn, female    DOB: [birth year]  Age: 50 y.o. MRN: [id number]  CC: Hospitalization Follow-up [facility] f/u, pt had a stroke on 02/20/21. Pt states she has been doing better since leaving the hospital. /Pt declined flu vaccine today. )  HPI Accompanied by her sister today Hospitalization: 08/19-08/29/2022 [facility]) Primary diagnosis: Acute ischemic CVA of Left MCA. Her presenting symptom was Aphasia. IV tPA was not administered due to prolonged window between symptoms onset and presentation to ED. "MRI brain: showed a moderate sized acute to subacute infarction within the left middle cerebral artery involving predominantly the left frontal lobe. Vessel imaging: superior LEFT MCA division occlusion reported.  ECG: Normal sinus rhythm. Transthoracic Echocardiogram: Left ventricular ejection fraction was 55-60%. Wall motion was normal.Transesophageal Echocardiogram: Negative for l eft atrial appendage thrombus with no interatrial shunt noted. A 30 day cardiac monitor is recommended to rule out paroxysmal atrial fibrillation. Hemoglobin A1C: 5.5 with no history of diabetes. Lipid profile: 90D." Her medication list was reconciled today: B12 supplement, atorvastatin and aspirin was added to her list of medications.  Today [name] denies any acute compliant since hospital discharge, She lives with her teenage son and brother. They assister with her care and supervision. [name] who is present today, also participates in her care. She continues to have difficulty to her speech (slow and word finding). She denies any confusion, dysphagia, or physical limitations. She had her first f/up appt with neurology on 03/04/2021. She is to receive a 30days heart event monitor. She is scheduled to have home PT, OT and Speech therapy.  Reviewed past Medical, Social and Family history today.  Outpatient Medications Prior to Visit  Medication Sig Dispense Refill    aspirin 81 MG chewable tablet Chew by mouth.     atorvastatin (LIPITOR) 80 MG tablet Take 80 mg by mouth at bedtime.     clotrimazole-betamethasone (LOTRISONE) cream Apply 1 application topically 2 (two) times daily. 45 g 2   [employer] VITAMIN B12 1000 MCG tablet Take 1,000 mcg by mouth daily.     ferrous gluconate (FERGON) 324 MG tablet TAKE 1 TABLET (324 MG TOTAL) BY MOUTH 3 (THREE) TIMES DAILY WITH MEALS. 90 tablet 3   ibuprofen (ADVIL,MOTRIN) 200 MG tablet Take 400 mg by mouth every 6 (six) hours as needed for moderate pain.     ketoconazole (NIZORAL) 2 % shampoo Apply 1 application topically 2 (two) times a week. 120 mL 2   No facility-administered medications prior to visit.    ROS See HPI  Objective:  BP 120/86 (BP Location: Left Arm, Patient Position: Sitting, Cuff Size: Large)   Pulse 86   Temp 97.9 F (36.6 C) (Temporal)   Wt 271 lb 3.2 oz (123 kg)   LMP 02/20/2021   SpO2 98%   BMI 54.78 kg/m   Physical Exam Vitals reviewed.  Constitutional:      General: She is not in acute distress.    Appearance: She is obese.  Cardiovascular:     Rate and Rhythm: Normal rate and regular rhythm.     Pulses: Normal pulses.     Heart sounds: Normal heart sounds.  Pulmonary:     Effort: Pulmonary effort is normal.     Breath sounds: Normal breath sounds.  Musculoskeletal:     Cervical back: Normal range of motion and neck supple.     Right lower leg: No edema.     Left lower leg: No edema.  Neurological:     Mental Status: She is alert and oriented to person, place, and time.     Cranial Nerves: Dysarthria present. No facial asymmetry.     Sensory: Sensation is intact.     Coordination: Coordination normal.     Gait: Gait normal.     Deep Tendon Reflexes: Reflexes normal.     Comments: She is able to answer questions appropriately but her speech is slow and she pauses intermittently to find her words.   Assessment & Plan:  This visit occurred during the SARS-CoV-2 public  health emergency.  Safety protocols were in place, including screening questions prior to the visit, additional usage of staff PPE, and extensive cleaning of exam room while observing appropriate contact time as indicated for disinfecting solutions.   Rittie was seen today for hospitalization follow-up.  Diagnoses and all orders for this visit:  History of cerebrovascular accident (CVA) with residual deficit -     Basic metabolic panel  Iron deficiency anemia due to chronic blood loss -     CBC -     Cancel: Iron, TIBC and Ferritin Panel Stable lab results. She is to maintain appt with neurology and home services. Advised [name] and her sister about need for daily supervision for at least 57month They verbalized understanding.  Problem List Items Addressed This Visit       Other   Anemia   Relevant Medications   [employer] VITAMIN B12 1000 MCG tablet   Other Relevant Orders   CBC (Completed)   History of cerebrovascular accident (CVA) with residual deficit - Primary   Relevant Orders   Basic metabolic panel (Completed)     Follow-up: Return in about 3 months (around 06/05/2021) for CPE (fasting).  CWilfred Lacy NP

## 2021-03-09 ENCOUNTER — Encounter: Payer: Self-pay | Admitting: Nurse Practitioner

## 2021-03-09 NOTE — Assessment & Plan Note (Signed)
Hospitalization: 08/19-08/29/2022 [facility]) Primary diagnosis: Acute ischemic CVA of Left MCA. Her presenting symptom was Aphasia. IV tPA was not administered due to prolonged window between symptoms onset and presentation to ED. "MRI brain: showed a moderate sized acute to subacute infarction within the left middle cerebral artery involving predominantly the left frontal lobe. Vessel imaging: superior LEFT MCA division occlusion reported.  ECG: Normal sinus rhythm. Transthoracic Echocardiogram: Left ventricular ejection fraction was 55-60%. Wall motion was normal.Transesophageal Echocardiogram: Negative for l eft atrial appendage thrombus with no interatrial shunt noted. A 30 day cardiac monitor is recommended to rule out paroxysmal atrial fibrillation. Hemoglobin A1C: 5.5 with no history of diabetes. Lipid profile: 90D." Her medication list was reconciled today: B12 supplement, atorvastatin and aspirin was added to her list of medications.  Today [name] denies any acute compliant since hospital discharge, She lives with her teenage son and brother. They assister with her care and supervision. [name] who is present today, also participates in her care. She continues to have difficulty to her speech (slow and word finding). She denies any confusion, dysphagia, or physical limitations. She had her first f/up appt with [facility] neurology: Dr. Maurice Small on 03/04/2021. She is to receive a 30days heart event monitor. She is scheduled to have home PT, OT and Speech therapy.

## 2021-03-12 DIAGNOSIS — I69320 Aphasia following cerebral infarction: Secondary | ICD-10-CM | POA: Diagnosis not present

## 2021-03-12 DIAGNOSIS — I69351 Hemiplegia and hemiparesis following cerebral infarction affecting right dominant side: Secondary | ICD-10-CM | POA: Diagnosis not present

## 2021-03-18 DIAGNOSIS — I639 Cerebral infarction, unspecified: Secondary | ICD-10-CM | POA: Diagnosis not present

## 2021-03-18 DIAGNOSIS — Z8673 Personal history of transient ischemic attack (TIA), and cerebral infarction without residual deficits: Secondary | ICD-10-CM | POA: Diagnosis not present

## 2021-03-19 DIAGNOSIS — I69320 Aphasia following cerebral infarction: Secondary | ICD-10-CM | POA: Diagnosis not present

## 2021-03-24 DIAGNOSIS — I69320 Aphasia following cerebral infarction: Secondary | ICD-10-CM | POA: Diagnosis not present

## 2021-03-24 DIAGNOSIS — I69351 Hemiplegia and hemiparesis following cerebral infarction affecting right dominant side: Secondary | ICD-10-CM | POA: Diagnosis not present

## 2021-03-26 ENCOUNTER — Telehealth: Payer: Self-pay | Admitting: Nurse Practitioner

## 2021-03-26 DIAGNOSIS — I69320 Aphasia following cerebral infarction: Secondary | ICD-10-CM | POA: Diagnosis not present

## 2021-03-27 ENCOUNTER — Other Ambulatory Visit: Payer: Self-pay | Admitting: Nurse Practitioner

## 2021-03-27 ENCOUNTER — Telehealth: Payer: Self-pay | Admitting: Nurse Practitioner

## 2021-03-27 NOTE — Telephone Encounter (Signed)
Called and spoke to pharmacy rep. Rx request for atorvastatin, vitamin b12, and aspirin 81 mg sent to the correct provider for approval. Sw. cma

## 2021-03-30 MED ORDER — CVS VITAMIN B-12 1000 MCG PO TABS: 1000.0000 ug | ORAL_TABLET | Freq: Every day | ORAL | 2 refills | Status: DC

## 2021-03-30 MED ORDER — ATORVASTATIN CALCIUM 80 MG PO TABS: 80.0000 mg | ORAL_TABLET | Freq: Every day | ORAL | 2 refills | Status: DC

## 2021-03-30 MED ORDER — ASPIRIN 81 MG PO CHEW: 81.0000 mg | CHEWABLE_TABLET | Freq: Every day | ORAL | 2 refills | Status: DC

## 2021-03-30 NOTE — Telephone Encounter (Signed)
Last OV 03/06/21 Next OV 06/09/21  Chart supports Rx but it was provided by a previous provider, do you want to refill, and can patient get B12 and Aspirin OTC?

## 2021-04-02 DIAGNOSIS — I69320 Aphasia following cerebral infarction: Secondary | ICD-10-CM | POA: Diagnosis not present

## 2021-04-03 DIAGNOSIS — I69351 Hemiplegia and hemiparesis following cerebral infarction affecting right dominant side: Secondary | ICD-10-CM | POA: Diagnosis not present

## 2021-04-03 DIAGNOSIS — R29898 Other symptoms and signs involving the musculoskeletal system: Secondary | ICD-10-CM | POA: Diagnosis not present

## 2021-04-03 DIAGNOSIS — R278 Other lack of coordination: Secondary | ICD-10-CM | POA: Diagnosis not present

## 2021-04-09 DIAGNOSIS — I69320 Aphasia following cerebral infarction: Secondary | ICD-10-CM | POA: Diagnosis not present

## 2021-04-14 DIAGNOSIS — I69320 Aphasia following cerebral infarction: Secondary | ICD-10-CM | POA: Diagnosis not present

## 2021-04-21 DIAGNOSIS — I69320 Aphasia following cerebral infarction: Secondary | ICD-10-CM | POA: Diagnosis not present

## 2021-04-21 DIAGNOSIS — Z8673 Personal history of transient ischemic attack (TIA), and cerebral infarction without residual deficits: Secondary | ICD-10-CM | POA: Diagnosis not present

## 2021-04-21 DIAGNOSIS — R531 Weakness: Secondary | ICD-10-CM | POA: Diagnosis not present

## 2021-04-21 DIAGNOSIS — R278 Other lack of coordination: Secondary | ICD-10-CM | POA: Diagnosis not present

## 2021-04-28 DIAGNOSIS — R531 Weakness: Secondary | ICD-10-CM | POA: Diagnosis not present

## 2021-04-28 DIAGNOSIS — Z8673 Personal history of transient ischemic attack (TIA), and cerebral infarction without residual deficits: Secondary | ICD-10-CM | POA: Diagnosis not present

## 2021-04-28 DIAGNOSIS — R278 Other lack of coordination: Secondary | ICD-10-CM | POA: Diagnosis not present

## 2021-05-12 DIAGNOSIS — I69951 Hemiplegia and hemiparesis following unspecified cerebrovascular disease affecting right dominant side: Secondary | ICD-10-CM | POA: Diagnosis not present

## 2021-05-26 ENCOUNTER — Other Ambulatory Visit: Payer: Self-pay

## 2021-05-26 NOTE — Patient Outreach (Signed)
[place] [facility]) Care Management  05/26/2021  Renee Hahn 02/12/1971 [id number]   First telephone outreach attempt to obtain mRS. Patient asked to receive a call back.  [place] [facility] Management Assistant [phone]

## 2021-06-01 ENCOUNTER — Other Ambulatory Visit: Payer: Self-pay

## 2021-06-01 NOTE — Patient Outreach (Signed)
[place] [facility]) Care Management  06/01/2021  Renee Hahn [birth year] [id number]   Telephone outreach to patient to obtain mRS was successfully completed. MRS= 1  Thank you, [place] Care Management Assistant

## 2021-06-09 ENCOUNTER — Encounter: Payer: Self-pay | Admitting: Nurse Practitioner

## 2021-06-09 ENCOUNTER — Other Ambulatory Visit: Payer: Self-pay

## 2021-06-09 ENCOUNTER — Ambulatory Visit (INDEPENDENT_AMBULATORY_CARE_PROVIDER_SITE_OTHER): Payer: 59 | Admitting: Nurse Practitioner

## 2021-06-09 VITALS — BP 104/84 | HR 84 | Temp 97.4°F | Ht 59.25 in | Wt 273.2 lb

## 2021-06-09 DIAGNOSIS — Z136 Encounter for screening for cardiovascular disorders: Secondary | ICD-10-CM

## 2021-06-09 DIAGNOSIS — Z23 Encounter for immunization: Secondary | ICD-10-CM

## 2021-06-09 DIAGNOSIS — D500 Iron deficiency anemia secondary to blood loss (chronic): Secondary | ICD-10-CM

## 2021-06-09 DIAGNOSIS — I6930 Unspecified sequelae of cerebral infarction: Secondary | ICD-10-CM

## 2021-06-09 DIAGNOSIS — Z1211 Encounter for screening for malignant neoplasm of colon: Secondary | ICD-10-CM | POA: Diagnosis not present

## 2021-06-09 DIAGNOSIS — E538 Deficiency of other specified B group vitamins: Secondary | ICD-10-CM | POA: Diagnosis not present

## 2021-06-09 DIAGNOSIS — Z0001 Encounter for general adult medical examination with abnormal findings: Secondary | ICD-10-CM | POA: Diagnosis not present

## 2021-06-09 DIAGNOSIS — Z1231 Encounter for screening mammogram for malignant neoplasm of breast: Secondary | ICD-10-CM

## 2021-06-09 DIAGNOSIS — Z13220 Encounter for screening for lipoid disorders: Secondary | ICD-10-CM | POA: Diagnosis not present

## 2021-06-09 LAB — LIPID PANEL
Cholesterol: 87 mg/dL (ref 0–200)
HDL: 30.4 mg/dL — ABNORMAL LOW (ref >39.00)
LDL Cholesterol: 42 mg/dL (ref 0–99)
NonHDL: 57.02
Total CHOL/HDL Ratio: 3
Triglycerides: 74 mg/dL (ref 0.0–149.0)
VLDL: 14.8 mg/dL (ref 0.0–40.0)

## 2021-06-09 LAB — CBC WITH DIFFERENTIAL/PLATELET
Basophils Absolute: 0 10*3/uL (ref 0.0–0.1)
Basophils Relative: 0.4 % (ref 0.0–3.0)
Eosinophils Absolute: 0.1 10*3/uL (ref 0.0–0.7)
Eosinophils Relative: 1 % (ref 0.0–5.0)
HCT: 38.4 % (ref 36.0–46.0)
Hemoglobin: 12.1 g/dL (ref 12.0–15.0)
Lymphocytes Relative: 24.7 % (ref 12.0–46.0)
Lymphs Abs: 2.3 10*3/uL (ref 0.7–4.0)
MCHC: 31.5 g/dL (ref 30.0–36.0)
MCV: 72.3 fl — ABNORMAL LOW (ref 78.0–100.0)
Monocytes Absolute: 0.5 10*3/uL (ref 0.1–1.0)
Monocytes Relative: 5.5 % (ref 3.0–12.0)
Neutro Abs: 6.4 10*3/uL (ref 1.4–7.7)
Neutrophils Relative %: 68.4 % (ref 43.0–77.0)
Platelets: 286 10*3/uL (ref 150.0–400.0)
RBC: 5.31 Mil/uL — ABNORMAL HIGH (ref 3.87–5.11)
RDW: 16.7 % — ABNORMAL HIGH (ref 11.5–15.5)
WBC: 9.4 10*3/uL (ref 4.0–10.5)

## 2021-06-09 LAB — COMPREHENSIVE METABOLIC PANEL
ALT: 19 U/L (ref 0–35)
AST: 35 U/L (ref 0–37)
Albumin: 3.7 g/dL (ref 3.5–5.2)
Alkaline Phosphatase: 111 U/L (ref 39–117)
BUN: 14 mg/dL (ref 6–23)
CO2: 28 mEq/L (ref 19–32)
Calcium: 9.2 mg/dL (ref 8.4–10.5)
Chloride: 103 mEq/L (ref 96–112)
Creatinine, Ser: 0.77 mg/dL (ref 0.40–1.20)
GFR: 91.09 mL/min (ref >60.00)
Glucose, Bld: 85 mg/dL (ref 70–99)
Potassium: 4.4 mEq/L (ref 3.5–5.1)
Sodium: 138 mEq/L (ref 135–145)
Total Bilirubin: 0.6 mg/dL (ref 0.2–1.2)
Total Protein: 6.8 g/dL (ref 6.0–8.3)

## 2021-06-09 LAB — TSH: TSH: 2.46 u[IU]/mL (ref 0.35–5.50)

## 2021-06-09 LAB — VITAMIN B12: Vitamin B-12: 892 pg/mL (ref 211–911)

## 2021-06-09 NOTE — Assessment & Plan Note (Signed)
Reports improved speech and physical endurance. Last appt with neurology 05/2021, has been released to return to work part-time. She also started driving short distances. She feels overwhelmed if she drives on he highway. Still pending event monitor report. Current use of ASA 81mg  and atorvastatin Repeat lipid panel and CMP today

## 2021-06-09 NOTE — Assessment & Plan Note (Signed)
Repeat B12 level

## 2021-06-09 NOTE — Assessment & Plan Note (Signed)
Due to menorrhagia, no dysmenorrhea, no pelvic pain, no clots. Last PAP with Dr. Landry Mellow in 2019 (normal per patient), will schedule appt for repeat Repeat cbc and iron panel today

## 2021-06-09 NOTE — Patient Instructions (Signed)
Go to lab for blood draw Maintain mediterranean diet and daily exercise. Sign medical release to get records from GYN (PAP and mammogram) You will be contacted to schedule appt with GI and for mammogram  Preventive Care 75-48 Years Old, Female Preventive care refers to lifestyle choices and visits with your health care provider that can promote health and wellness. Preventive care visits are also called wellness exams. What can I expect for my preventive care visit? Counseling Your health care provider may ask you questions about your: Medical history, including: Past medical problems. Family medical history. Pregnancy history. Current health, including: Menstrual cycle. Method of birth control. Emotional well-being. Home life and relationship well-being. Sexual activity and sexual health. Lifestyle, including: Alcohol, nicotine or tobacco, and drug use. Access to firearms. Diet, exercise, and sleep habits. Work and work Statistician. Sunscreen use. Safety issues such as seatbelt and bike helmet use. Physical exam Your health care provider will check your: Height and weight. These may be used to calculate your BMI (body mass index). BMI is a measurement that tells if you are at a healthy weight. Waist circumference. This measures the distance around your waistline. This measurement also tells if you are at a healthy weight and may help predict your risk of certain diseases, such as type 2 diabetes and high blood pressure. Heart rate and blood pressure. Body temperature. Skin for abnormal spots. What immunizations do I need? Vaccines are usually given at various ages, according to a schedule. Your health care provider will recommend vaccines for you based on your age, medical history, and lifestyle or other factors, such as travel or where you work. What tests do I need? Screening Your health care provider may recommend screening tests for certain conditions. This may  include: Lipid and cholesterol levels. Diabetes screening. This is done by checking your blood sugar (glucose) after you have not eaten for a while (fasting). Pelvic exam and Pap test. Hepatitis B test. Hepatitis C test. HIV (human immunodeficiency virus) test. STI (sexually transmitted infection) testing, if you are at risk. Lung cancer screening. Colorectal cancer screening. Mammogram. Talk with your health care provider about when you should start having regular mammograms. This may depend on whether you have a family history of breast cancer. BRCA-related cancer screening. This may be done if you have a family history of breast, ovarian, tubal, or peritoneal cancers. Bone density scan. This is done to screen for osteoporosis. Talk with your health care provider about your test results, treatment options, and if necessary, the need for more tests. Follow these instructions at home: Eating and drinking  Eat a diet that includes fresh fruits and vegetables, whole grains, lean protein, and low-fat dairy products. Take vitamin and mineral supplements as recommended by your health care provider. Do not drink alcohol if: Your health care provider tells you not to drink. You are pregnant, may be pregnant, or are planning to become pregnant. If you drink alcohol: Limit how much you have to 0-1 drink a day. Know how much alcohol is in your drink. In the [place] one drink equals one 12 oz bottle of beer (355 mL), one 5 oz glass of wine (148 mL), or one 1 oz glass of hard liquor (44 mL). Lifestyle Brush your teeth every morning and night with fluoride toothpaste. Floss one time each day. Exercise for at least 30 minutes 5 or more days each week. Do not use any products that contain nicotine or tobacco. These products include cigarettes, chewing tobacco, and vaping  devices, such as e-cigarettes. If you need help quitting, ask your health care provider. Do not use drugs. If you are sexually  active, practice safe sex. Use a condom or other form of protection to prevent STIs. If you do not wish to become pregnant, use a form of birth control. If you plan to become pregnant, see your health care provider for a prepregnancy visit. Take aspirin only as told by your health care provider. Make sure that you understand how much to take and what form to take. Work with your health care provider to find out whether it is safe and beneficial for you to take aspirin daily. Find healthy ways to manage stress, such as: Meditation, yoga, or listening to music. Journaling. Talking to a trusted person. Spending time with friends and family. Minimize exposure to UV radiation to reduce your risk of skin cancer. Safety Always wear your seat belt while driving or riding in a vehicle. Do not drive: If you have been drinking alcohol. Do not ride with someone who has been drinking. When you are tired or distracted. While texting. If you have been using any mind-altering substances or drugs. Wear a helmet and other protective equipment during sports activities. If you have firearms in your house, make sure you follow all gun safety procedures. Seek help if you have been physically or sexually abused. What's next? Visit your health care provider once a year for an annual wellness visit. Ask your health care provider how often you should have your eyes and teeth checked. Stay up to date on all vaccines. This information is not intended to replace advice given to you by your health care provider. Make sure you discuss any questions you have with your health care provider. Document Revised: 12/17/2020 Document Reviewed: 12/17/2020 Elsevier Patient Education  [place].

## 2021-06-09 NOTE — Progress Notes (Signed)
Subjective:    Patient ID: Renee Hahn, female    DOB: [birth year], 50 y.o.   MRN: [id number]  Patient presents today for CPE and eval of chronic conditions  HPI Vitamin B12 deficiency Repeat B12 level  Iron deficiency anemia due to chronic blood loss Due to menorrhagia, no dysmenorrhea, no pelvic pain, no clots. Last PAP with Dr. Landry Mellow in 2019 (normal per patient), will schedule appt for repeat Repeat cbc and iron panel today   History of cerebrovascular accident (CVA) with residual deficit Reports improved speech and physical endurance. Last appt with neurology 05/2021, has been released to return to work part-time. She also started driving short distances. She feels overwhelmed if she drives on he highway. Still pending event monitor report. Current use of ASA 81mg  and atorvastatin Repeat lipid panel and CMP today  Vision:has upcoming appt Dental:will schedule Diet:mediterranean diet Exercise:strength training daily Weight:  Wt Readings from Last 3 Encounters:  06/09/21 273 lb 3.2 oz (123.9 kg)  03/06/21 271 lb 3.2 oz (123 kg)  04/30/20 281 lb 11.2 oz (127.8 kg)    Sexual History (orientation,birth control, marital status, STD):not sexually active LMP 06/05/2021, denies need for STD screen  Depression/Suicide: Depression screen [facility] 2/9 06/09/2021 03/06/2021 01/24/2020 12/17/2019  Decreased Interest 0 0 0 0  Down, Depressed, Hopeless 0 1 0 0  PHQ - 2 Score 0 1 0 0  Altered sleeping 1 1 - -  Tired, decreased energy 0 0 - -  Change in appetite 0 0 - -  Feeling bad or failure about yourself  0 0 - -  Trouble concentrating 1 0 - -  Moving slowly or fidgety/restless 1 0 - -  Suicidal thoughts 0 0 - -  PHQ-9 Score 3 2 - -  Difficult doing work/chores Not difficult at all Somewhat difficult - -   GAD 7 : Generalized Anxiety Score 06/09/2021 03/06/2021  Nervous, Anxious, on Edge 0 0  Control/stop worrying 0 1  Worry too much - different things 0 1  Trouble relaxing 0 0   Restless 0 0  Easily annoyed or irritable 0 0  Afraid - awful might happen 0 1  Total GAD 7 Score 0 3  Anxiety Difficulty - Not difficult at all   Immunizations: (TDAP, Hep C screen, Pneumovax, Influenza, zoster)  Health Maintenance  Topic Date Due   Colon Cancer Screening  Never done   Pap Smear  01/12/2018   COVID-19 Vaccine (3 - Booster for Pfizer series) 06/25/2021*   Flu Shot  10/02/2021*   Hepatitis C Screening: USPSTF Recommendation to screen - Ages 18-79 yo.  06/09/2022*   HIV Screening  06/09/2022*   Tetanus Vaccine  06/10/2031   Pneumococcal Vaccination  Aged Out   HPV Vaccine  Aged Out  *Topic was postponed. The date shown is not the original due date.   Fall Risk: Fall Risk  06/09/2021 01/24/2020 12/17/2019  Falls in the past year? 0 0 0  Number falls in past yr: 0 0 0  Injury with Fall? 0 0 0  Risk for fall due to : No Fall Risks - -  Follow up Falls evaluation completed - -   Medications and allergies reviewed with patient and updated if appropriate.  Patient Active Problem List   Diagnosis Date Noted   History of cerebrovascular accident (CVA) with residual deficit 03/02/2021   Vitamin B12 deficiency 02/27/2021   BMI 50.0-59.9, adult ([place]) 01/28/2020   Morbid obesity ([place]) 01/24/2020   Menorrhagia 12/18/2019  Iron deficiency anemia due to chronic blood loss 12/17/2019   Seborrhea 12/17/2019   S/P laparoscopic cholecystectomy 09/21/2019    Current Outpatient Medications on File Prior to Visit  Medication Sig Dispense Refill   aspirin 81 MG chewable tablet Chew 1 tablet (81 mg total) by mouth daily. 90 tablet 2   atorvastatin (LIPITOR) 80 MG tablet Take 1 tablet (80 mg total) by mouth at bedtime. 90 tablet 2   [facility] VITAMIN B12 1000 MCG tablet Take 1 tablet (1,000 mcg total) by mouth daily. 90 tablet 2   ferrous gluconate (FERGON) 324 MG tablet TAKE 1 TABLET (324 MG TOTAL) BY MOUTH 3 (THREE) TIMES DAILY WITH MEALS. 90 tablet 3   ibuprofen (ADVIL,MOTRIN) 200  MG tablet Take 400 mg by mouth every 6 (six) hours as needed for moderate pain.     ketoconazole (NIZORAL) 2 % shampoo Apply 1 application topically 2 (two) times a week. 120 mL 2   No current facility-administered medications on file prior to visit.    Past Medical History:  Diagnosis Date   Hay fever    Stroke [facility])     Past Surgical History:  Procedure Laterality Date   CHOLECYSTECTOMY N/A 09/21/2019   Procedure: LAPAROSCOPIC CHOLECYSTECTOMY WITH INTRAOPERATIVE CHOLANGIOGRAM;  Surgeon: Erroll Luna, MD;  Location: [facility];  Service: General;  Laterality: N/A;   Social History   Socioeconomic History   Marital status: Widowed    Spouse name: Not on file   Number of children: Not on file   Years of education: Not on file   Highest education level: Not on file  Occupational History   Not on file  Tobacco Use   Smoking status: Never   Smokeless tobacco: Never  Substance and Sexual Activity   Alcohol use: Yes    Comment: socially   Drug use: No   Sexual activity: Not on file  Other Topics Concern   Not on file  Social History Narrative   Not on file   Social Determinants of Health   Financial Resource Strain: Not on file  Food Insecurity: Not on file  Transportation Needs: Not on file  Physical Activity: Not on file  Stress: Not on file  Social Connections: Not on file    Family History  Problem Relation Age of Onset   Cancer Other    Cancer Mother        breast   Cancer Father        protaste   Hypertension Maternal Grandmother    Hypertension Paternal Grandfather         Review of Systems  Constitutional:  Negative for fever, malaise/fatigue and weight loss.  HENT:  Negative for congestion and sore throat.   Eyes:        Negative for visual changes  Respiratory:  Negative for cough and shortness of breath.   Cardiovascular:  Negative for chest pain, palpitations and leg swelling.  Gastrointestinal:  Negative for blood in stool, constipation,  diarrhea and heartburn.  Genitourinary:  Negative for dysuria, frequency and urgency.  Musculoskeletal:  Negative for falls, joint pain and myalgias.  Skin:  Negative for rash.  Neurological:  Negative for dizziness, sensory change and headaches.  Endo/Heme/Allergies:  Does not bruise/bleed easily.  Psychiatric/Behavioral:  Negative for depression, substance abuse and suicidal ideas. The patient is not nervous/anxious.    Objective:   Vitals:   06/09/21 0920  BP: 104/84  Pulse: 84  Temp: (!) 97.4 F (36.3 C)  SpO2: 99%  Body mass index is 54.72 kg/m.   Physical Examination:  Physical Exam Vitals reviewed.  Constitutional:      General: She is not in acute distress.    Appearance: She is well-developed. She is obese.  HENT:     Right Ear: Tympanic membrane, ear canal and external ear normal.     Left Ear: Tympanic membrane, ear canal and external ear normal.  Eyes:     Extraocular Movements: Extraocular movements intact.     Conjunctiva/sclera: Conjunctivae normal.  Cardiovascular:     Rate and Rhythm: Normal rate and regular rhythm.     Pulses: Normal pulses.     Heart sounds: Normal heart sounds.  Pulmonary:     Effort: Pulmonary effort is normal. No respiratory distress.     Breath sounds: Normal breath sounds.  Chest:     Chest wall: No tenderness.  Breasts:    Breasts are symmetrical.     Right: Normal.     Left: Normal.  Abdominal:     General: Bowel sounds are normal.     Palpations: Abdomen is soft.  Genitourinary:    Comments: Decline pelvic exam due to menstrual cycle Musculoskeletal:        General: Normal range of motion.     Cervical back: Normal range of motion and neck supple.     Right lower leg: No edema.     Left lower leg: No edema.  Lymphadenopathy:     Cervical: No cervical adenopathy.     Upper Body:     Right upper body: No supraclavicular, axillary or pectoral adenopathy.     Left upper body: No supraclavicular, axillary or  pectoral adenopathy.  Skin:    General: Skin is warm and dry.  Neurological:     Mental Status: She is alert and oriented to person, place, and time.     Deep Tendon Reflexes: Reflexes are normal and symmetric.  Psychiatric:        Mood and Affect: Mood normal.        Behavior: Behavior normal.        Thought Content: Thought content normal.   ASSESSMENT and PLAN: This visit occurred during the SARS-CoV-2 public health emergency.  Safety protocols were in place, including screening questions prior to the visit, additional usage of staff PPE, and extensive cleaning of exam room while observing appropriate contact time as indicated for disinfecting solutions.   Enrica was seen today for annual exam.  Diagnoses and all orders for this visit:  Encounter for preventative adult health care exam with abnormal findings -     Comprehensive metabolic panel -     Lipid panel -     TSH -     MM 3D SCREEN BREAST BILATERAL; Future -     Ambulatory referral to Gastroenterology  Vitamin B12 deficiency -     B12  Iron deficiency anemia due to chronic blood loss -     CBC with Differential/Platelet -     Iron, TIBC and Ferritin Panel  Morbid obesity ([place])  Colon cancer screening -     Ambulatory referral to Gastroenterology  Breast cancer screening by mammogram -     MM 3D SCREEN BREAST BILATERAL; Future  Encounter for lipid screening for cardiovascular disease -     Lipid panel  History of cerebrovascular accident (CVA) with residual deficit  Need for diphtheria-tetanus-pertussis (Tdap) vaccine -     Tdap vaccine greater than or equal to 7yo IM  Go  to lab for blood draw Maintain mediterranean diet and daily exercise. Sign medical release to get records from GYN (PAP and mammogram) You will be contacted to schedule appt with GI and for mammogram.    Problem List Items Addressed This Visit       Other   History of cerebrovascular accident (CVA) with residual deficit     Reports improved speech and physical endurance. Last appt with neurology 05/2021, has been released to return to work part-time. She also started driving short distances. She feels overwhelmed if she drives on he highway. Still pending event monitor report. Current use of ASA 81mg  and atorvastatin Repeat lipid panel and CMP today      Iron deficiency anemia due to chronic blood loss    Due to menorrhagia, no dysmenorrhea, no pelvic pain, no clots. Last PAP with Dr. Landry Mellow in 2019 (normal per patient), will schedule appt for repeat Repeat cbc and iron panel today       Relevant Orders   CBC with Differential/Platelet (Completed)   Iron, TIBC and Ferritin Panel   Morbid obesity ([place])   Vitamin B12 deficiency    Repeat B12 level      Relevant Orders   B12 (Completed)   Other Visit Diagnoses     Encounter for preventative adult health care exam with abnormal findings    -  Primary   Relevant Orders   Comprehensive metabolic panel (Completed)   Lipid panel (Completed)   TSH (Completed)   MM 3D SCREEN BREAST BILATERAL   Ambulatory referral to Gastroenterology   Colon cancer screening       Relevant Orders   Ambulatory referral to Gastroenterology   Breast cancer screening by mammogram       Relevant Orders   MM 3D SCREEN BREAST BILATERAL   Encounter for lipid screening for cardiovascular disease       Relevant Orders   Lipid panel (Completed)   Need for diphtheria-tetanus-pertussis (Tdap) vaccine       Relevant Orders   Tdap vaccine greater than or equal to 7yo IM (Completed)       Follow up: Return in about 1 year (around 06/09/2022) for CPE (fasting).  Wilfred Lacy, NP

## 2021-06-10 LAB — IRON,TIBC AND FERRITIN PANEL
%SAT: 13 % (calc) — ABNORMAL LOW (ref 16–45)
Ferritin: 14 ng/mL — ABNORMAL LOW (ref 16–232)
Iron: 52 ug/dL (ref 40–190)
TIBC: 401 mcg/dL (calc) (ref 250–450)

## 2021-06-11 MED ORDER — FERROUS GLUCONATE 324 (38 FE) MG PO TABS
324.0000 mg | ORAL_TABLET | Freq: Every day | ORAL | 3 refills | Status: DC
Start: 2021-06-11 — End: 2022-05-31

## 2021-06-11 NOTE — Addendum Note (Signed)
Addended by: Leana Gamer on: 06/11/2021 03:29 PM   Modules accepted: Orders

## 2021-06-12 ENCOUNTER — Encounter: Payer: Self-pay | Admitting: Nurse Practitioner

## 2021-06-30 ENCOUNTER — Ambulatory Visit: Payer: 59

## 2021-08-05 ENCOUNTER — Encounter: Payer: Self-pay | Admitting: Physician Assistant

## 2021-08-10 ENCOUNTER — Other Ambulatory Visit: Payer: Self-pay

## 2021-08-10 ENCOUNTER — Ambulatory Visit
Admission: RE | Admit: 2021-08-10 | Discharge: 2021-08-10 | Disposition: A | Discharge status: Home / Self Care | Payer: No Typology Code available for payment source | Source: Ambulatory Visit | Attending: Nurse Practitioner | Admitting: Nurse Practitioner

## 2021-08-10 DIAGNOSIS — Z1231 Encounter for screening mammogram for malignant neoplasm of breast: Secondary | ICD-10-CM

## 2021-08-10 DIAGNOSIS — Z0001 Encounter for general adult medical examination with abnormal findings: Secondary | ICD-10-CM

## 2021-08-12 ENCOUNTER — Other Ambulatory Visit: Payer: Self-pay | Admitting: Nurse Practitioner

## 2021-08-12 DIAGNOSIS — R928 Other abnormal and inconclusive findings on diagnostic imaging of breast: Secondary | ICD-10-CM

## 2021-08-13 ENCOUNTER — Ambulatory Visit
Admission: RE | Admit: 2021-08-13 | Discharge: 2021-08-13 | Disposition: A | Discharge status: Home / Self Care | Payer: No Typology Code available for payment source | Source: Ambulatory Visit | Attending: Nurse Practitioner | Admitting: Nurse Practitioner

## 2021-08-13 ENCOUNTER — Other Ambulatory Visit: Payer: Self-pay | Admitting: Nurse Practitioner

## 2021-08-13 DIAGNOSIS — R928 Other abnormal and inconclusive findings on diagnostic imaging of breast: Secondary | ICD-10-CM

## 2021-08-24 ENCOUNTER — Ambulatory Visit
Admission: RE | Admit: 2021-08-24 | Discharge: 2021-08-24 | Disposition: A | Discharge status: Home / Self Care | Payer: No Typology Code available for payment source | Source: Ambulatory Visit | Attending: Nurse Practitioner | Admitting: Nurse Practitioner

## 2021-08-24 DIAGNOSIS — R928 Other abnormal and inconclusive findings on diagnostic imaging of breast: Secondary | ICD-10-CM

## 2021-10-08 ENCOUNTER — Encounter: Payer: Self-pay | Admitting: Nurse Practitioner

## 2021-11-10 ENCOUNTER — Ambulatory Visit: Payer: Self-pay | Admitting: Surgery

## 2021-11-10 DIAGNOSIS — D242 Benign neoplasm of left breast: Secondary | ICD-10-CM

## 2021-11-13 ENCOUNTER — Other Ambulatory Visit: Payer: Self-pay | Admitting: Surgery

## 2021-11-13 DIAGNOSIS — D242 Benign neoplasm of left breast: Secondary | ICD-10-CM

## 2021-11-25 NOTE — Pre-Procedure Instructions (Signed)
Surgical Instructions    Your procedure is scheduled on Thursday, June 1.  Report to [facility] Main Entrance "A" at 9:30 A.M., then check in with the Admitting office.  Call this number if you have problems the morning of surgery:  [phone]   If you have any questions prior to your surgery date call [phone]: Open Monday-Friday 8am-4pm    Remember:  Do not eat or drink after midnight the night before your surgery     Take these medicines the morning of surgery with A SIP OF WATER:  acetaminophen (TYLENOL) if needed  Follow your surgeon's instructions on when to stop Aspirin.  If no instructions were given by your surgeon then you will need to call the office to get those instructions.    As of today, STOP taking any Aleve, Naproxen, Ibuprofen, Motrin, Advil, Goody's, BC's, all herbal medications, fish oil, and all vitamins.           Do not wear jewelry or makeup Do not wear lotions, powders, perfumes/colognes, or deodorant. Do not shave 48 hours prior to surgery.  Men may shave face and neck. Do not bring valuables to the hospital. Do not wear nail polish, gel polish, artificial nails, or any other type of covering on natural nails (fingers and toes) If you have artificial nails or gel coating that need to be removed by a nail salon, please have this removed prior to surgery. Artificial nails or gel coating may interfere with anesthesia's ability to adequately monitor your vital signs.  [place] is not responsible for any belongings or valuables. .   Do NOT Smoke (Tobacco/Vaping)  24 hours prior to your procedure  If you use a CPAP at night, you may bring your mask for your overnight stay.   Contacts, glasses, hearing aids, dentures or partials may not be worn into surgery, please bring cases for these belongings   For patients admitted to the hospital, discharge time will be determined by your treatment team.   Patients discharged the day of surgery will not be  allowed to drive home, and someone needs to stay with them for 24 hours.   SURGICAL WAITING ROOM VISITATION Patients having surgery or a procedure in a hospital may have two support people. Children under the age of 22 must have an adult with them who is not the patient. They may stay in the waiting area during the procedure and may switch out with other visitors. If the patient needs to stay at the hospital during part of their recovery, the visitor guidelines for inpatient rooms apply.  Please refer to the [facility] website for the visitor guidelines for Inpatients (after your surgery is over and you are in a regular room).       Special instructions:    Oral Hygiene is also important to reduce your risk of infection.  Remember - BRUSH YOUR TEETH THE MORNING OF SURGERY WITH YOUR REGULAR TOOTHPASTE   [place]- Preparing For Surgery  Before surgery, you can play an important role. Because skin is not sterile, your skin needs to be as free of germs as possible. You can reduce the number of germs on your skin by washing with CHG (chlorahexidine gluconate) Soap before surgery.  CHG is an antiseptic cleaner which kills germs and bonds with the skin to continue killing germs even after washing.     Please do not use if you have an allergy to CHG or antibacterial soaps. If your skin becomes reddened/irritated stop  using the CHG.  Do not shave (including legs and underarms) for at least 48 hours prior to first CHG shower. It is OK to shave your face.  Please follow these instructions carefully.     Shower the NIGHT BEFORE SURGERY and the MORNING OF SURGERY with CHG Soap.   If you chose to wash your hair, wash your hair first as usual with your normal shampoo. After you shampoo, rinse your hair and body thoroughly to remove the shampoo.  Then [employer] and genitals (private parts) with your normal soap and rinse thoroughly to remove soap.  After that Use CHG Soap as you would any other  liquid soap. You can apply CHG directly to the skin and wash gently with a scrungie or a clean washcloth.   Apply the CHG Soap to your body ONLY FROM THE NECK DOWN.  Do not use on open wounds or open sores. Avoid contact with your eyes, ears, mouth and genitals (private parts). Wash Face and genitals (private parts)  with your normal soap.   Wash thoroughly, paying special attention to the area where your surgery will be performed.  Thoroughly rinse your body with warm water from the neck down.  DO NOT shower/wash with your normal soap after using and rinsing off the CHG Soap.  Pat yourself dry with a CLEAN TOWEL.  Wear CLEAN PAJAMAS to bed the night before surgery  Place CLEAN SHEETS on your bed the night before your surgery  DO NOT SLEEP WITH PETS.   Day of Surgery:  Take a shower with CHG soap. Wear Clean/Comfortable clothing the morning of surgery Do not apply any deodorants/lotions.   Remember to brush your teeth WITH YOUR REGULAR TOOTHPASTE.    If you received a COVID test during your pre-op visit, it is requested that you wear a mask when out in public, stay away from anyone that may not be feeling well, and notify your surgeon if you develop symptoms. If you have been in contact with anyone that has tested positive in the last 10 days, please notify your surgeon.    Please read over the following fact sheets that you were given.

## 2021-11-26 ENCOUNTER — Other Ambulatory Visit: Payer: Self-pay

## 2021-11-26 ENCOUNTER — Encounter (HOSPITAL_COMMUNITY)
Admission: RE | Admit: 2021-11-26 | Discharge: 2021-11-26 | Disposition: A | Discharge status: Home / Self Care | Payer: No Typology Code available for payment source | Source: Ambulatory Visit | Attending: Surgery | Admitting: Surgery

## 2021-11-26 ENCOUNTER — Encounter (HOSPITAL_COMMUNITY): Payer: Self-pay

## 2021-11-26 VITALS — BP 133/95 | HR 72 | Temp 98.3°F | Resp 18 | Ht 59.0 in | Wt 284.4 lb

## 2021-11-26 DIAGNOSIS — D649 Anemia, unspecified: Secondary | ICD-10-CM | POA: Diagnosis not present

## 2021-11-26 DIAGNOSIS — Z01812 Encounter for preprocedural laboratory examination: Secondary | ICD-10-CM | POA: Diagnosis present

## 2021-11-26 DIAGNOSIS — Z01818 Encounter for other preprocedural examination: Secondary | ICD-10-CM

## 2021-11-26 DIAGNOSIS — Z8673 Personal history of transient ischemic attack (TIA), and cerebral infarction without residual deficits: Secondary | ICD-10-CM | POA: Diagnosis not present

## 2021-11-26 HISTORY — DX: Headache, unspecified: R51.9

## 2021-11-26 LAB — CBC
HCT: 39.8 % (ref 36.0–46.0)
Hemoglobin: 11.9 g/dL — ABNORMAL LOW (ref 12.0–15.0)
MCH: 23.2 pg — ABNORMAL LOW (ref 26.0–34.0)
MCHC: 29.9 g/dL — ABNORMAL LOW (ref 30.0–36.0)
MCV: 77.4 fL — ABNORMAL LOW (ref 80.0–100.0)
Platelets: 276 10*3/uL (ref 150–400)
RBC: 5.14 MIL/uL — ABNORMAL HIGH (ref 3.87–5.11)
RDW: 16.5 % — ABNORMAL HIGH (ref 11.5–15.5)
WBC: 10.4 10*3/uL (ref 4.0–10.5)
nRBC: 0 % (ref 0.0–0.2)

## 2021-11-26 LAB — BASIC METABOLIC PANEL
Anion gap: 7 (ref 5–15)
BUN: 12 mg/dL (ref 6–20)
CO2: 27 mmol/L (ref 22–32)
Calcium: 9.1 mg/dL (ref 8.9–10.3)
Chloride: 105 mmol/L (ref 98–111)
Creatinine, Ser: 0.73 mg/dL (ref 0.44–1.00)
GFR, Estimated: >60 mL/min (ref >60)
Glucose, Bld: 105 mg/dL — ABNORMAL HIGH (ref 70–99)
Potassium: 4.5 mmol/L (ref 3.5–5.1)
Sodium: 139 mmol/L (ref 135–145)

## 2021-11-26 LAB — POCT PREGNANCY, URINE: Preg Test, Ur: NEGATIVE

## 2021-11-26 NOTE — Progress Notes (Signed)
PCP -  Wilfred Lacy, NP Cardiologist - denies  PPM/ICD - n/a  Chest x-ray - n/a EKG - 02/25/21 at [facility]. Tracing requested.  Stress Test - denies ECHO - 02/2021. Care Everywhere Cardiac Cath - denies  Sleep Study - denies CPAP - n/a  DM- denies  Blood Thinner Instructions: n/a Aspirin Instructions: Please follow your surgeon's instructions regarding Aspirin. If you have not received instructions then please contact your surgeon's office for instructions.   ERAS Protcol - No. NPO  COVID TEST- N/A   Anesthesia review: Yes. Seed Lumpectomy.  Urine Pregnancy test obtained at PAT visit was negative.   Patient denies shortness of breath, fever, cough and chest pain at PAT appointment   All instructions explained to the patient, with a verbal understanding of the material. Patient agrees to go over the instructions while at home for a better understanding. The opportunity to ask questions was provided.

## 2021-11-27 ENCOUNTER — Telehealth: Payer: Self-pay | Admitting: Nurse Practitioner

## 2021-11-27 NOTE — Telephone Encounter (Signed)
Pt advised & voiced understanding

## 2021-11-27 NOTE — Telephone Encounter (Signed)
Pt is having a cyst removed on 12/03/21 this coming Thursday. Her surgeon at [facility] Dr. Brantley Stage is wanting to know if should she stop taking her aspirin EC 81 MG tablet a day before the surgery. Please advise pt at [phone] and it's OK to leave a message.

## 2021-11-27 NOTE — Anesthesia Preprocedure Evaluation (Signed)
Anesthesia Evaluation Anesthesia Physical Anesthesia Plan  ASA:   Anesthesia Plan:    Post-op Pain Management:    Induction:   PONV Risk Score and Plan:   Airway Management Planned:   Additional Equipment:   Intra-op Plan:   Post-operative Plan:   Informed Consent:   Plan Discussed with:   Anesthesia Plan Comments: (PAT note by Karoline Caldwell, PA-C: History of left MCA stroke treated with thrombectomy August 2022.  Echo showed EF 55 to 60%, no thrombus, no intra-atrial shunt, normal valves.  Cardiac event monitor 06/2021 showed sinus rhythm with average heart rate 78. No SVT, no A. fib, no VT, no long pauses 3 seconds ormore noted.  Last seen by neurology 05/14/2021.  Doing well at that time, improved significantly with speech therapy and occupational therapy and was discharged from their services.  She was released to resume work and driving.  Last seen by PCP Wilfred Lacy, NP 06/09/2021 and noted to be doing well at that time.  Preop labs reviewed, mild anemia with hemoglobin 11.9, otherwise unremarkable.  EKG 02/25/2021 (Care Everywhere, narrative only): NSR with sinus arrhythmia.  Rate 73.  Minimal voltage criteria for LVH, may be normal variant.  Cannot rule out anterior infarct, age undetermined.  TEE 02/24/2021 (Care Everywhere): Left Ventricle  Wall thickness is normal. EF: 55-60%. No regional wall motion abnormalities noted.   Right Ventricle  Systolic function is normal.   Left Atrium  Left atrium is normal in size. no thrombus in left atrial appendage. Appendage velocityis normal at greater than 40 cm/sec. Injection of agitated saline documents no interatrial shunt.   Right Atrium  Eustachian valve visualized   Mitral Valve  Mitral valve structure is normal. There is trace regurgitation.   Tricuspid Valve  Tricuspidvalve structure is normal. There is no regurgitation.   Aortic Valve  The aortic valve is tricuspid. There is no regurgitation.   Pulmonic  Valve  Pulmonic valve structure is normal. No regurgitation present on the pulmonic valve   Ascending Aorta  The aorta appears normal in size.   Pericardium  There is no pericardial effusion. )        Anesthesia Quick Evaluation

## 2021-11-27 NOTE — Progress Notes (Signed)
Anesthesia Chart Review:  History of left MCA stroke treated with thrombectomy August 2022.  Echo showed EF 55 to 60%, no thrombus, no intra-atrial shunt, normal valves.  Cardiac event monitor 06/2021 showed sinus rhythm with average heart rate 78. No SVT, no A. fib, no VT, no long pauses 3 seconds or more noted.  Last seen by neurology 05/14/2021.  Doing well at that time, improved significantly with speech therapy and occupational therapy and was discharged from their services.  She was released to resume work and driving.  Last seen by PCP Wilfred Lacy, NP 06/09/2021 and noted to be doing well at that time.  Preop labs reviewed, mild anemia with hemoglobin 11.9, otherwise unremarkable.  EKG 02/25/2021 (Care Everywhere, narrative only): NSR with sinus arrhythmia.  Rate 73.  Minimal voltage criteria for LVH, may be normal variant.  Cannot rule out anterior infarct, age undetermined.  TEE 02/24/2021 (Care Everywhere): Left Ventricle  Wall thickness is normal. EF: 55-60%. No regional wall motion abnormalities noted.   Right Ventricle  Systolic function is normal.   Left Atrium  Left atrium is normal in size. no thrombus in left atrial appendage. Appendage velocity is normal at greater than 40 cm/sec. Injection of agitated saline documents no interatrial shunt.   Right Atrium  Eustachian valve visualized   Mitral Valve  Mitral valve structure is normal. There is trace regurgitation.   Tricuspid Valve  Tricuspid valve structure is normal. There is no regurgitation.   Aortic Valve  The aortic valve is tricuspid. There is no regurgitation.   Pulmonic Valve  Pulmonic valve structure is normal. No regurgitation present on the pulmonic valve   Ascending Aorta  The aorta appears normal in size.   Pericardium  There is no pericardial effusion.     Wynonia Musty [facility] Short Stay Center/Anesthesiology Phone [phone] 11/27/2021 2:15 PM

## 2021-11-27 NOTE — Telephone Encounter (Signed)
See below, please advise.

## 2021-12-02 ENCOUNTER — Ambulatory Visit
Admission: RE | Admit: 2021-12-02 | Discharge: 2021-12-02 | Disposition: A | Discharge status: Home / Self Care | Payer: No Typology Code available for payment source | Source: Ambulatory Visit | Attending: Surgery | Admitting: Surgery

## 2021-12-02 DIAGNOSIS — D242 Benign neoplasm of left breast: Secondary | ICD-10-CM

## 2021-12-03 ENCOUNTER — Ambulatory Visit
Admission: RE | Admit: 2021-12-03 | Discharge: 2021-12-03 | Disposition: A | Discharge status: Home / Self Care | Payer: No Typology Code available for payment source | Source: Ambulatory Visit | Attending: Surgery | Admitting: Surgery

## 2021-12-03 ENCOUNTER — Ambulatory Visit (HOSPITAL_COMMUNITY)
Admission: RE | Admit: 2021-12-03 | Discharge: 2021-12-03 | Disposition: A | Discharge status: Home / Self Care | Payer: No Typology Code available for payment source | Attending: Surgery | Admitting: Surgery

## 2021-12-03 ENCOUNTER — Ambulatory Visit (HOSPITAL_COMMUNITY): Payer: No Typology Code available for payment source | Admitting: Physician Assistant

## 2021-12-03 ENCOUNTER — Encounter (HOSPITAL_COMMUNITY): Payer: Self-pay | Admitting: Surgery

## 2021-12-03 ENCOUNTER — Encounter (HOSPITAL_COMMUNITY)
Admission: RE | Disposition: A | Discharge status: Home / Self Care | Payer: Self-pay | Source: Home / Self Care | Attending: Surgery

## 2021-12-03 ENCOUNTER — Other Ambulatory Visit: Payer: Self-pay

## 2021-12-03 ENCOUNTER — Ambulatory Visit (HOSPITAL_BASED_OUTPATIENT_CLINIC_OR_DEPARTMENT_OTHER): Payer: No Typology Code available for payment source | Admitting: Anesthesiology

## 2021-12-03 DIAGNOSIS — Z6841 Body Mass Index (BMI) 40.0 and over, adult: Secondary | ICD-10-CM | POA: Diagnosis not present

## 2021-12-03 DIAGNOSIS — D242 Benign neoplasm of left breast: Secondary | ICD-10-CM | POA: Diagnosis present

## 2021-12-03 DIAGNOSIS — I6990 Unspecified sequelae of unspecified cerebrovascular disease: Secondary | ICD-10-CM

## 2021-12-03 DIAGNOSIS — Z803 Family history of malignant neoplasm of breast: Secondary | ICD-10-CM | POA: Diagnosis not present

## 2021-12-03 DIAGNOSIS — N6082 Other benign mammary dysplasias of left breast: Secondary | ICD-10-CM | POA: Insufficient documentation

## 2021-12-03 DIAGNOSIS — D252 Subserosal leiomyoma of uterus: Secondary | ICD-10-CM | POA: Insufficient documentation

## 2021-12-03 HISTORY — PX: BREAST EXCISIONAL BIOPSY: SUR124

## 2021-12-03 HISTORY — PX: BREAST LUMPECTOMY WITH RADIOACTIVE SEED LOCALIZATION: SHX6424

## 2021-12-03 SURGERY — BREAST LUMPECTOMY WITH RADIOACTIVE SEED LOCALIZATION
Anesthesia: General | Site: Breast | Laterality: Left

## 2021-12-03 MED ORDER — ROCURONIUM BROMIDE 10 MG/ML (PF) SYRINGE
PREFILLED_SYRINGE | INTRAVENOUS | Status: DC | PRN
  Administered 2021-12-03: 35 mg via INTRAVENOUS
  Administered 2021-12-03: 5 mg via INTRAVENOUS

## 2021-12-03 MED ORDER — MIDAZOLAM HCL 2 MG/2ML IJ SOLN
INTRAMUSCULAR | Status: AC
  Filled 2021-12-03: qty 2

## 2021-12-03 MED ORDER — SUGAMMADEX SODIUM 200 MG/2ML IV SOLN
INTRAVENOUS | Status: DC | PRN
  Administered 2021-12-03: 200 mg via INTRAVENOUS

## 2021-12-03 MED ORDER — CHLORHEXIDINE GLUCONATE CLOTH 2 % EX PADS: 6.0000 | MEDICATED_PAD | Freq: Once | CUTANEOUS | Status: DC

## 2021-12-03 MED ORDER — PROPOFOL 10 MG/ML IV BOLUS
INTRAVENOUS | Status: DC | PRN
  Administered 2021-12-03: 100 mg via INTRAVENOUS
  Administered 2021-12-03: 200 mg via INTRAVENOUS

## 2021-12-03 MED ORDER — FENTANYL CITRATE (PF) 100 MCG/2ML IJ SOLN
INTRAMUSCULAR | Status: DC | PRN
Start: 2021-12-03 — End: 2021-12-03
  Administered 2021-12-03 (×2): 50 ug via INTRAVENOUS

## 2021-12-03 MED ORDER — CEFAZOLIN IN SODIUM CHLORIDE 3-0.9 GM/100ML-% IV SOLN
3.0000 g | INTRAVENOUS | Status: AC
  Administered 2021-12-03: 3 g via INTRAVENOUS
  Filled 2021-12-03: qty 100

## 2021-12-03 MED ORDER — ORAL CARE MOUTH RINSE: 15.0000 mL | Freq: Once | OROMUCOSAL | Status: AC

## 2021-12-03 MED ORDER — ROCURONIUM BROMIDE 10 MG/ML (PF) SYRINGE
PREFILLED_SYRINGE | INTRAVENOUS | Status: AC
  Filled 2021-12-03: qty 10

## 2021-12-03 MED ORDER — DEXAMETHASONE SODIUM PHOSPHATE 10 MG/ML IJ SOLN
INTRAMUSCULAR | Status: DC | PRN
  Administered 2021-12-03: 10 mg via INTRAVENOUS

## 2021-12-03 MED ORDER — PHENYLEPHRINE 80 MCG/ML (10ML) SYRINGE FOR IV PUSH (FOR BLOOD PRESSURE SUPPORT)
PREFILLED_SYRINGE | INTRAVENOUS | Status: DC | PRN
  Administered 2021-12-03: 160 ug via INTRAVENOUS

## 2021-12-03 MED ORDER — DEXAMETHASONE SODIUM PHOSPHATE 10 MG/ML IJ SOLN
INTRAMUSCULAR | Status: AC
  Filled 2021-12-03: qty 1

## 2021-12-03 MED ORDER — FENTANYL CITRATE (PF) 250 MCG/5ML IJ SOLN
INTRAMUSCULAR | Status: AC
  Filled 2021-12-03: qty 5

## 2021-12-03 MED ORDER — SCOPOLAMINE 1 MG/3DAYS TD PT72: 1.0000 | MEDICATED_PATCH | TRANSDERMAL | Status: DC

## 2021-12-03 MED ORDER — ONDANSETRON HCL 4 MG/2ML IJ SOLN
INTRAMUSCULAR | Status: AC
  Filled 2021-12-03: qty 2

## 2021-12-03 MED ORDER — PHENYLEPHRINE 80 MCG/ML (10ML) SYRINGE FOR IV PUSH (FOR BLOOD PRESSURE SUPPORT)
PREFILLED_SYRINGE | INTRAVENOUS | Status: AC
  Filled 2021-12-03: qty 10

## 2021-12-03 MED ORDER — SCOPOLAMINE 1 MG/3DAYS TD PT72
MEDICATED_PATCH | TRANSDERMAL | Status: AC
  Administered 2021-12-03: 1.5 mg via TRANSDERMAL
  Filled 2021-12-03: qty 1

## 2021-12-03 MED ORDER — PROPOFOL 10 MG/ML IV BOLUS
INTRAVENOUS | Status: AC
  Filled 2021-12-03: qty 20

## 2021-12-03 MED ORDER — ACETAMINOPHEN 500 MG PO TABS
1000.0000 mg | ORAL_TABLET | ORAL | Status: AC
  Administered 2021-12-03: 1000 mg via ORAL
  Filled 2021-12-03: qty 2

## 2021-12-03 MED ORDER — LIDOCAINE 2% (20 MG/ML) 5 ML SYRINGE
INTRAMUSCULAR | Status: AC
  Filled 2021-12-03: qty 5

## 2021-12-03 MED ORDER — ONDANSETRON HCL 4 MG/2ML IJ SOLN
INTRAMUSCULAR | Status: DC | PRN
  Administered 2021-12-03: 4 mg via INTRAVENOUS

## 2021-12-03 MED ORDER — MIDAZOLAM HCL 5 MG/5ML IJ SOLN
INTRAMUSCULAR | Status: DC | PRN
  Administered 2021-12-03: 2 mg via INTRAVENOUS

## 2021-12-03 MED ORDER — LIDOCAINE 2% (20 MG/ML) 5 ML SYRINGE
INTRAMUSCULAR | Status: DC | PRN
  Administered 2021-12-03: 100 mg via INTRAVENOUS

## 2021-12-03 MED ORDER — OXYCODONE HCL 5 MG PO TABS: 5.0000 mg | ORAL_TABLET | Freq: Four times a day (QID) | ORAL | 0 refills | Status: DC | PRN

## 2021-12-03 MED ORDER — SUCCINYLCHOLINE CHLORIDE 200 MG/10ML IV SOSY
PREFILLED_SYRINGE | INTRAVENOUS | Status: AC
  Filled 2021-12-03: qty 10

## 2021-12-03 MED ORDER — FENTANYL CITRATE (PF) 100 MCG/2ML IJ SOLN: 25.0000 ug | INTRAMUSCULAR | Status: DC | PRN

## 2021-12-03 MED ORDER — SUCCINYLCHOLINE CHLORIDE 200 MG/10ML IV SOSY
PREFILLED_SYRINGE | INTRAVENOUS | Status: DC | PRN
  Administered 2021-12-03: 200 mg via INTRAVENOUS

## 2021-12-03 MED ORDER — LACTATED RINGERS IV SOLN: INTRAVENOUS | Status: DC

## 2021-12-03 MED ORDER — BUPIVACAINE-EPINEPHRINE 0.25% -1:200000 IJ SOLN
INTRAMUSCULAR | Status: DC | PRN
  Administered 2021-12-03: 20 mL

## 2021-12-03 MED ORDER — CHLORHEXIDINE GLUCONATE 0.12 % MT SOLN
15.0000 mL | Freq: Once | OROMUCOSAL | Status: AC
  Administered 2021-12-03: 15 mL via OROMUCOSAL
  Filled 2021-12-03: qty 15

## 2021-12-03 MED ORDER — BUPIVACAINE-EPINEPHRINE (PF) 0.25% -1:200000 IJ SOLN
INTRAMUSCULAR | Status: AC
  Filled 2021-12-03: qty 30

## 2021-12-03 MED ORDER — 0.9 % SODIUM CHLORIDE (POUR BTL) OPTIME
TOPICAL | Status: DC | PRN
  Administered 2021-12-03: 1000 mL

## 2021-12-03 SURGICAL SUPPLY — 46 items
ADH SKN CLS APL DERMABOND .7 (GAUZE/BANDAGES/DRESSINGS) ×1
APL PRP STRL LF DISP 70% ISPRP (MISCELLANEOUS) ×1
APPLIER CLIP 9.375 MED OPEN (MISCELLANEOUS)
APR CLP MED 9.3 20 MLT OPN (MISCELLANEOUS)
BAG COUNTER SPONGE SURGICOUNT (BAG) ×3 IMPLANT
BAG SPNG CNTER NS LX DISP (BAG) ×1
BINDER BREAST 3XL (GAUZE/BANDAGES/DRESSINGS) ×1 IMPLANT
BINDER BREAST LRG (GAUZE/BANDAGES/DRESSINGS) IMPLANT
BINDER BREAST XLRG (GAUZE/BANDAGES/DRESSINGS) IMPLANT
CANISTER SUCT 3000ML PPV (MISCELLANEOUS) IMPLANT
CHLORAPREP W/TINT 26 (MISCELLANEOUS) ×3 IMPLANT
CLIP APPLIE 9.375 MED OPEN (MISCELLANEOUS) IMPLANT
COVER PROBE W GEL 5X96 (DRAPES) ×3 IMPLANT
COVER SURGICAL LIGHT HANDLE (MISCELLANEOUS) ×3 IMPLANT
DERMABOND ADVANCED (GAUZE/BANDAGES/DRESSINGS) ×1
DERMABOND ADVANCED .7 DNX12 (GAUZE/BANDAGES/DRESSINGS) ×2 IMPLANT
DEVICE DUBIN SPECIMEN MAMMOGRA (MISCELLANEOUS) ×3 IMPLANT
DRAPE CHEST BREAST 15X10 FENES (DRAPES) ×3 IMPLANT
ELECT CAUTERY BLADE 6.4 (BLADE) ×3 IMPLANT
ELECT REM PT RETURN 9FT ADLT (ELECTROSURGICAL) ×2
ELECTRODE REM PT RTRN 9FT ADLT (ELECTROSURGICAL) ×2 IMPLANT
GAUZE PAD ABD 8X10 STRL (GAUZE/BANDAGES/DRESSINGS) ×1 IMPLANT
GAUZE SPONGE 4X4 12PLY STRL LF (GAUZE/BANDAGES/DRESSINGS) ×1 IMPLANT
GLOVE BIO SURGEON STRL SZ8 (GLOVE) ×3 IMPLANT
GLOVE BIOGEL PI IND STRL 8 (GLOVE) ×2 IMPLANT
GLOVE BIOGEL PI INDICATOR 8 (GLOVE) ×1
GOWN STRL REUS W/ TWL LRG LVL3 (GOWN DISPOSABLE) ×2 IMPLANT
GOWN STRL REUS W/ TWL XL LVL3 (GOWN DISPOSABLE) ×2 IMPLANT
GOWN STRL REUS W/TWL LRG LVL3 (GOWN DISPOSABLE) ×2
GOWN STRL REUS W/TWL XL LVL3 (GOWN DISPOSABLE) ×2
KIT BASIN OR (CUSTOM PROCEDURE TRAY) ×3 IMPLANT
KIT MARKER MARGIN INK (KITS) IMPLANT
LIGHT WAVEGUIDE WIDE FLAT (MISCELLANEOUS) IMPLANT
NDL HYPO 25GX1X1/2 BEV (NEEDLE) IMPLANT
NEEDLE HYPO 25GX1X1/2 BEV (NEEDLE) IMPLANT
NS IRRIG 1000ML POUR BTL (IV SOLUTION) IMPLANT
PACK GENERAL/GYN (CUSTOM PROCEDURE TRAY) ×3 IMPLANT
SUT MNCRL AB 4-0 PS2 18 (SUTURE) ×3 IMPLANT
SUT SILK 2 0 SH (SUTURE) IMPLANT
SUT VIC AB 2-0 SH 27 (SUTURE)
SUT VIC AB 2-0 SH 27XBRD (SUTURE) IMPLANT
SUT VIC AB 3-0 SH 27 (SUTURE)
SUT VIC AB 3-0 SH 27X BRD (SUTURE) IMPLANT
SUT VIC AB 3-0 SH 8-18 (SUTURE) ×3 IMPLANT
SYR CONTROL 10ML LL (SYRINGE) IMPLANT
TOWEL GREEN STERILE FF (TOWEL DISPOSABLE) IMPLANT

## 2021-12-03 NOTE — Interval H&P Note (Signed)
History and Physical Interval Note:  12/03/2021 11:15 AM  Renee Hahn  has presented today for surgery, with the diagnosis of LEFT BREAST PAPILLOMA.  The various methods of treatment have been discussed with the patient and family. After consideration of risks, benefits and other options for treatment, the patient has consented to  Procedure(s): LEFT BREAST LUMPECTOMY WITH RADIOACTIVE SEED LOCALIZATION (Left) as a surgical intervention.  The patient's history has been reviewed, patient examined, no change in status, stable for surgery.  I have reviewed the patient's chart and labs.  Questions were answered to the patient's satisfaction.     [place]

## 2021-12-03 NOTE — H&P (Signed)
Chief Complaint: New Consultation (Left Breast)   History of Present Illness: Renee Hahn is a 51 y.o. female who is seen today as an office consultation for evaluation of New Consultation (Left Breast) .   Patient presents for evaluation of left breast mammographic abnormality. She had a density left breast upper quadrant core biopsy proven to be a papilloma with calcifications. She has a mother who had breast cancer at the age of 45 who is deceased. No other family history. No history of breast masses or previous breast biopsies.  Review of Systems: A complete review of systems was obtained from the patient. I have reviewed this information and discussed as appropriate with the patient. See HPI as well for other ROS.    Medical History: Past Medical History:  Diagnosis Date   Anemia   Patient Active Problem List  Diagnosis   Anemia   BMI 50.0-59.9, adult (CMS-HCC)   History of cerebrovascular accident (CVA) with residual deficit   Iron deficiency anemia due to chronic blood loss   Menorrhagia   Morbid obesity (CMS-HCC)   S/P laparoscopic cholecystectomy   Seborrhea   Acute ischemic left middle cerebral artery (MCA) stroke (CMS-HCC)   Vitamin B12 deficiency   Past Surgical History:  Procedure Laterality Date   CHOLECYSTECTOMY    No Known Allergies  Current Outpatient Medications on File Prior to Visit  Medication Sig Dispense Refill   atorvastatin (LIPITOR) 80 MG tablet Take 80 mg by mouth at bedtime   cyanocobalamin (VITAMIN B12) 1000 MCG tablet Take by mouth   aspirin 81 MG chewable tablet CHEW 1 TABLET BY MOUTH DAILY.   ferrous gluconate (FERGON) 324 MG tablet Take 1 tablet by mouth daily with breakfast   No current facility-administered medications on file prior to visit.   History reviewed. No pertinent family history.   Social History   Tobacco Use  Smoking Status Never  Smokeless Tobacco Never    Social History   Socioeconomic History    Marital status: Widowed  Tobacco Use   Smoking status: Never   Smokeless tobacco: Never  Vaping Use   Vaping Use: Every day  Substance and Sexual Activity   Alcohol use: Yes   Drug use: Never   Objective:   Vitals:  09/14/21 1457  BP: 130/70  Pulse: 99  Temp: 36.2 C (97.2 F)  SpO2: 98%  Weight: (!) 127.5 kg (281 lb)  Height: 149.9 cm ('4\' 11"'$ )   Body mass index is 56.76 kg/m.  Physical Exam Eyes:  General: No scleral icterus. Pupils: Pupils are equal, round, and reactive to light.  Cardiovascular:  Rate and Rhythm: Normal rate.  Pulmonary:  Effort: Pulmonary effort is normal.  Breath sounds: No stridor.  Chest:  Breasts: Right: Normal.  Left: Normal.  Musculoskeletal:  General: Normal range of motion.  Lymphadenopathy:  Upper Body:  Right upper body: No supraclavicular or axillary adenopathy.  Left upper body: No supraclavicular or axillary adenopathy.  Skin: General: Skin is warm.  Neurological:  General: No focal deficit present.  Mental Status: She is alert.  Psychiatric:  Mood and Affect: Mood normal.  Behavior: Behavior normal.     Labs, Imaging and Diagnostic Testing:  ADDENDUM: Pathology revealed INTRADUCTAL PAPILLOMA WITH CALCIFICATIONS, FIBROCYSTIC CHANGES of the LEFT breast, upper, (ribbon clip). This was found to be concordant by Dr. Hassan Rowan, with surgical consultation for excision recommended, per [facility] Breast Working Group protocol.   Pathology results were discussed with the patient by telephone. The patient reported  doing well after the biopsy with tenderness at the site. Post biopsy instructions and care were reviewed, and questions were answered. The patient was encouraged to call [facility] for any additional concerns. My direct phone number was provided.   Surgical consultation has been arranged with Dr. Erroll Luna at [facility] on September 14, 2021.   Pathology results  reported by Terie Purser, RN on 08/25/2021.  Diagnosis Breast, left, needle core biopsy - INTRADUCTAL PAPILLOMA WITH CALCIFICATIONS. - FIBROCYSTIC CHANGES. - SEE MICROSCOPIC DESCRIPTION  Assessment and Plan:   Diagnoses and all orders for this visit:  Papilloma of left breast    Discussed the pros and cons of lumpectomy. Discussed the procedure with her. Given her family history and calcifications were papilloma, consideration for lumpectomy would be warranted. The natural history and pathophysiology of papilloma were discussed. Also discussed observation is been shown to be safe given potential risk of malignancy is on the 5% range. Her case it might be a little higher which I discussed with her today. I discussed lumpectomy is probably her best option given her family history and pathology review. This is also in the periphery which could have a higher rate of malignancy. She will think things over and let me know. Of asked her to let us know either way so we can plan follow-up in 6 months with mammography if she does not proceed  No follow-ups on file.  Kennieth Francois, MD

## 2021-12-03 NOTE — Discharge Instructions (Signed)
[facility] Office Phone Number [phone]  BREAST BIOPSY/ PARTIAL MASTECTOMY: POST OP INSTRUCTIONS  Always review your discharge instruction sheet given to you by the facility where your surgery was performed.  IF YOU HAVE DISABILITY OR FAMILY LEAVE FORMS, YOU MUST BRING THEM TO THE OFFICE FOR PROCESSING.  DO NOT GIVE THEM TO YOUR DOCTOR.  A prescription for pain medication may be given to you upon discharge.  Take your pain medication as prescribed, if needed.  If narcotic pain medicine is not needed, then you may take acetaminophen (Tylenol) or ibuprofen (Advil) as needed. Take your usually prescribed medications unless otherwise directed If you need a refill on your pain medication, please contact your pharmacy.  They will contact our office to request authorization.  Prescriptions will not be filled after 5pm or on week-ends. You should eat very light the first 24 hours after surgery, such as soup, crackers, pudding, etc.  Resume your normal diet the day after surgery. Most patients will experience some swelling and bruising in the breast.  Ice packs and a good support bra will help.  Swelling and bruising can take several days to resolve.  It is common to experience some constipation if taking pain medication after surgery.  Increasing fluid intake and taking a stool softener will usually help or prevent this problem from occurring.  A mild laxative (Milk of Magnesia or Miralax) should be taken according to package directions if there are no bowel movements after 48 hours. Unless discharge instructions indicate otherwise, you may remove your bandages 24-48 hours after surgery, and you may shower at that time.  You may have steri-strips (small skin tapes) in place directly over the incision.  These strips should be left on the skin for 7-10 days.  If your surgeon used skin glue on the incision, you may shower in 24 hours.  The glue will flake off over the next 2-3 weeks.  Any  sutures or staples will be removed at the office during your follow-up visit. ACTIVITIES:  You may resume regular daily activities (gradually increasing) beginning the next day.  Wearing a good support bra or sports bra minimizes pain and swelling.  You may have sexual intercourse when it is comfortable. You may drive when you no longer are taking prescription pain medication, you can comfortably wear a seatbelt, and you can safely maneuver your car and apply brakes. RETURN TO WORK:  ______________________________________________________________________________________ You should see your doctor in the office for a follow-up appointment approximately two weeks after your surgery.  Your doctor's nurse will typically make your follow-up appointment when she calls you with your pathology report.  Expect your pathology report 2-3 business days after your surgery.  You may call to check if you do not hear from us after three days. OTHER INSTRUCTIONS: _______________________________________________________________________________________________ _____________________________________________________________________________________________________________________________________ _____________________________________________________________________________________________________________________________________ _____________________________________________________________________________________________________________________________________  WHEN TO CALL YOUR DOCTOR: Fever over 101.0 Nausea and/or vomiting. Extreme swelling or bruising. Continued bleeding from incision. Increased pain, redness, or drainage from the incision.  The clinic staff is available to answer your questions during regular business hours.  Please don't hesitate to call and ask to speak to one of the nurses for clinical concerns.  If you have a medical emergency, go to the nearest emergency room or call 911.  A surgeon from [facility] is always on call at the hospital.  For further questions, please visit [email or web address]   

## 2021-12-03 NOTE — Anesthesia Postprocedure Evaluation (Signed)
Anesthesia Post Note  Patient: Renee Hahn  Procedure(s) Performed: LEFT BREAST LUMPECTOMY WITH RADIOACTIVE SEED LOCALIZATION (Left: Breast)     Patient location during evaluation: PACU Anesthesia Type: General Level of consciousness: awake and alert Pain management: pain level controlled Vital Signs Assessment: post-procedure vital signs reviewed and stable Respiratory status: spontaneous breathing, nonlabored ventilation, respiratory function stable and patient connected to nasal cannula oxygen Cardiovascular status: blood pressure returned to baseline and stable Postop Assessment: no apparent nausea or vomiting Anesthetic complications: no   No notable events documented.  Last Vitals:  Vitals:   12/03/21 1321 12/03/21 1336  BP: (!) 141/83 121/83  Pulse: 82 72  Resp: 18 14  Temp:  36.4 C  SpO2: 97% 98%    Last Pain:  Vitals:   12/03/21 1336  TempSrc:   PainSc: 0-No pain                 Veleka Djordjevic L Asianna Brundage

## 2021-12-03 NOTE — Transfer of Care (Signed)
Immediate Anesthesia Transfer of Care Note  Patient: Renee Hahn  Procedure(s) Performed: LEFT BREAST LUMPECTOMY WITH RADIOACTIVE SEED LOCALIZATION (Left: Breast)  Patient Location: PACU  Anesthesia Type:General  Level of Consciousness: awake, alert  and oriented  Airway & Oxygen Therapy: Patient Spontanous Breathing and Patient connected to face mask oxygen  Post-op Assessment: Report given to RN and Post -op Vital signs reviewed and stable  Post vital signs: Reviewed and stable  Last Vitals:  Vitals Value Taken Time  BP 156/78   Temp    Pulse 96 12/03/21 1306  Resp 15 12/03/21 1306  SpO2 100 % 12/03/21 1306  Vitals shown include unvalidated device data.  Last Pain:  Vitals:   12/03/21 1014  TempSrc:   PainSc: 0-No pain         Complications: No notable events documented.

## 2021-12-03 NOTE — Anesthesia Procedure Notes (Addendum)
Procedure Name: Intubation Date/Time: 12/03/2021 11:57 AM Performed by: Genelle Bal, CRNA Pre-anesthesia Checklist: Patient identified, Emergency Drugs available, Suction available and Patient being monitored Patient Re-evaluated:Patient Re-evaluated prior to induction Oxygen Delivery Method: Circle system utilized Preoxygenation: Pre-oxygenation with 100% oxygen Induction Type: IV induction Ventilation: Mask ventilation without difficulty Laryngoscope Size: Miller and 2 Grade View: Grade I Tube type: Oral Tube size: 7.0 mm Number of attempts: 1 Airway Equipment and Method: Stylet and Oral airway Placement Confirmation: ETT inserted through vocal cords under direct vision, positive ETCO2 and breath sounds checked- equal and bilateral Secured at: 21 cm Tube secured with: Tape Dental Injury: Teeth and Oropharynx as per pre-operative assessment  Comments: Attempted LMA #4 insertion, inadequate VT despite repositioning. Attempted with LMA #4 Proseal with no improvement in VT. LMA removed & pt intubated atraumatically. SpO2 remained >97% throughout.

## 2021-12-03 NOTE — Op Note (Signed)
Preoperative diagnosis: Left breast papilloma upper outer quadrant  Postoperative diagnosis: Same  Procedure: Seed localized left breast lumpectomy  Surgeon: Erroll Luna, MD  Assistant: Dr. Radonna Ricker MD  I was personally present during the key and critical portions of this procedure and immediately available throughout the entire procedure, as documented in my operative note.   Anesthesia: General 0.25% Marcaine plain local  EBL: Minimal  Specimen: Left breast tissue with seed and clip to pathology  Indications for procedure: The patient is a 51 year old female who had a mammographic abnormality left breast.  Core biopsy showed papilloma.  We discussed the pros and cons of excision as well as observation since the majority these lesions are malignant.  After discussion the above she wished to have it excised so we proceeded today with a left breast seed localized lumpectomy for left breast papilloma.The procedure has been discussed with the patient. Alternatives to surgery have been discussed with the patient.  Risks of surgery include bleeding,  Infection,  Seroma formation, death,  and the need for further surgery.   The patient understands and wishes to proceed.      Description of procedure: The patient was met in the holding area and questions were answered.  Films were available for review.  Patient has seed placed as an outpatient yesterday.  Left breast was marked as correct site.  All questions were answered.  She is taken back to the operating room.  She was placed supine upon the OR table.  After induction of general anesthesia, left breast was prepped and draped in sterile fashion timeout performed.  Proper patient, site and procedure verified.  Neoprobe used to identify seed left breast upper outer quadrant.  Local anesthetic infiltrated over the site.  A curvilinear incision was made over the signal.  Dissection was carried down all tissue and the seed and clip were  excised with grossly negative margins.  The Faxitron image revealed the seed and clip to be present in the specimen.  This was oriented with ink and sent to pathology.  The cavity is then irrigated.  Is made hemostatic.  Local anesthetic was infiltrated.  Cavity closed with a deep layer 3-0 Vicryl.  4 Monocryl used to close the skin in a subcuticular fashion.  Dermabond applied.  Breast binder placed.  All counts found to be correct.  The patient was awoke extubated taken to recovery in satisfactory condition.

## 2021-12-04 ENCOUNTER — Encounter (HOSPITAL_COMMUNITY): Payer: Self-pay | Admitting: Surgery

## 2021-12-08 LAB — SURGICAL PATHOLOGY

## 2021-12-10 ENCOUNTER — Encounter: Payer: Self-pay | Admitting: Surgery

## 2021-12-19 ENCOUNTER — Other Ambulatory Visit: Payer: Self-pay | Admitting: Nurse Practitioner

## 2021-12-22 NOTE — Telephone Encounter (Signed)
Chart supports rx Last OV: 06/2021 Next OV: 06/2022

## 2022-01-01 ENCOUNTER — Encounter (HOSPITAL_COMMUNITY): Payer: Self-pay

## 2022-01-04 ENCOUNTER — Other Ambulatory Visit: Payer: Self-pay | Admitting: Surgery

## 2022-01-04 DIAGNOSIS — Z9889 Other specified postprocedural states: Secondary | ICD-10-CM

## 2022-05-28 ENCOUNTER — Other Ambulatory Visit: Payer: Self-pay | Admitting: Nurse Practitioner

## 2022-05-28 DIAGNOSIS — D500 Iron deficiency anemia secondary to blood loss (chronic): Secondary | ICD-10-CM

## 2022-05-31 NOTE — Telephone Encounter (Signed)
Chart supports Rx Last OV: 06/2021 Next OV: 06/2022

## 2022-06-14 ENCOUNTER — Encounter: Payer: Self-pay | Admitting: Nurse Practitioner

## 2022-06-14 ENCOUNTER — Ambulatory Visit (INDEPENDENT_AMBULATORY_CARE_PROVIDER_SITE_OTHER): Payer: No Typology Code available for payment source | Admitting: Nurse Practitioner

## 2022-06-14 VITALS — BP 130/80 | HR 91 | Temp 97.2°F | Ht 59.0 in | Wt 284.2 lb

## 2022-06-14 DIAGNOSIS — Z0001 Encounter for general adult medical examination with abnormal findings: Secondary | ICD-10-CM

## 2022-06-14 DIAGNOSIS — D500 Iron deficiency anemia secondary to blood loss (chronic): Secondary | ICD-10-CM

## 2022-06-14 DIAGNOSIS — Z1211 Encounter for screening for malignant neoplasm of colon: Secondary | ICD-10-CM | POA: Diagnosis not present

## 2022-06-14 LAB — CBC WITH DIFFERENTIAL/PLATELET
Basophils Absolute: 0.1 10*3/uL (ref 0.0–0.1)
Basophils Relative: 0.8 % (ref 0.0–3.0)
Eosinophils Absolute: 0.2 10*3/uL (ref 0.0–0.7)
Eosinophils Relative: 1.7 % (ref 0.0–5.0)
HCT: 39.9 % (ref 36.0–46.0)
Hemoglobin: 12.8 g/dL (ref 12.0–15.0)
Lymphocytes Relative: 22.6 % (ref 12.0–46.0)
Lymphs Abs: 2 10*3/uL (ref 0.7–4.0)
MCHC: 32 g/dL (ref 30.0–36.0)
MCV: 74.6 fl — ABNORMAL LOW (ref 78.0–100.0)
Monocytes Absolute: 0.4 10*3/uL (ref 0.1–1.0)
Monocytes Relative: 4.8 % (ref 3.0–12.0)
Neutro Abs: 6.3 10*3/uL (ref 1.4–7.7)
Neutrophils Relative %: 70.1 % (ref 43.0–77.0)
Platelets: 254 10*3/uL (ref 150.0–400.0)
RBC: 5.35 Mil/uL — ABNORMAL HIGH (ref 3.87–5.11)
RDW: 17.5 % — ABNORMAL HIGH (ref 11.5–15.5)
WBC: 9.1 10*3/uL (ref 4.0–10.5)

## 2022-06-14 LAB — LIPID PANEL
Cholesterol: 97 mg/dL (ref 0–200)
HDL: 34 mg/dL — ABNORMAL LOW (ref >39.00)
LDL Cholesterol: 51 mg/dL (ref 0–99)
NonHDL: 63.33
Total CHOL/HDL Ratio: 3
Triglycerides: 61 mg/dL (ref 0.0–149.0)
VLDL: 12.2 mg/dL (ref 0.0–40.0)

## 2022-06-14 LAB — COMPREHENSIVE METABOLIC PANEL
ALT: 18 U/L (ref 0–35)
AST: 34 U/L (ref 0–37)
Albumin: 3.8 g/dL (ref 3.5–5.2)
Alkaline Phosphatase: 103 U/L (ref 39–117)
BUN: 11 mg/dL (ref 6–23)
CO2: 27 mEq/L (ref 19–32)
Calcium: 8.9 mg/dL (ref 8.4–10.5)
Chloride: 103 mEq/L (ref 96–112)
Creatinine, Ser: 0.68 mg/dL (ref 0.40–1.20)
GFR: 102.12 mL/min (ref >60.00)
Glucose, Bld: 92 mg/dL (ref 70–99)
Potassium: 4.2 mEq/L (ref 3.5–5.1)
Sodium: 138 mEq/L (ref 135–145)
Total Bilirubin: 0.3 mg/dL (ref 0.2–1.2)
Total Protein: 6.9 g/dL (ref 6.0–8.3)

## 2022-06-14 LAB — HEMOGLOBIN A1C: Hgb A1c MFr Bld: 6.2 % (ref 4.6–6.5)

## 2022-06-14 NOTE — Progress Notes (Signed)
Complete physical exam  Patient: Renee Hahn   DOB: [birth year]   51 y.o. Female  MRN: [id number] Visit Date: 06/14/2022  Subjective:    Chief Complaint  Patient presents with   Annual Exam    CPE  Pt fasting  No pap due to cycle     Renee Hahn is a 51 y.o. female who presents today for a complete physical exam. She reports consuming a general diet.  No exercise regimen  She generally feels well. She reports sleeping well. She does not have additional problems to discuss today.  Vision:No Dental:No STD Screen:No Plans to schedule appt with GYN for PAP Has upcoming appt 08/2022 for repeat mammogram  Home BP reading: 119/89, 116/78, 112/76, 121/70, 119/76, 125/78, 110/68, 108/64, 111/74  Wt Readings from Last 3 Encounters:  06/14/22 284 lb 3.2 oz (128.9 kg)  12/03/21 276 lb (125.2 kg)  11/26/21 284 lb 6.4 oz (129 kg)    BP Readings from Last 3 Encounters:  06/14/22 130/80  12/03/21 121/83  11/26/21 (!) 133/95    Most recent fall risk assessment:    06/09/2021    9:21 AM  Fall Risk   Falls in the past year? 0  Number falls in past yr: 0  Injury with Fall? 0  Risk for fall due to : No Fall Risks  Follow up Falls evaluation completed   Depression screen:Yes - No Depression  Most recent depression screenings:    06/14/2022    9:12 AM 06/09/2021    9:28 AM  PHQ 2/9 Scores  PHQ - 2 Score 1 0  PHQ- 9 Score 7 3    HPI  Iron deficiency anemia due to chronic blood loss Repeat cbc and iron panel Reports nausea and constipation with oral iron supplement.  Past Medical History:  Diagnosis Date   Hay fever    Headache    Stroke [facility])    Past Surgical History:  Procedure Laterality Date   BREAST LUMPECTOMY WITH RADIOACTIVE SEED LOCALIZATION Left 12/03/2021   Procedure: LEFT BREAST LUMPECTOMY WITH RADIOACTIVE SEED LOCALIZATION;  Surgeon: Erroll Luna, MD;  Location: [place];  Service: General;  Laterality: Left;   CHOLECYSTECTOMY N/A 09/21/2019    Procedure: LAPAROSCOPIC CHOLECYSTECTOMY WITH INTRAOPERATIVE CHOLANGIOGRAM;  Surgeon: Erroll Luna, MD;  Location: [facility];  Service: General;  Laterality: N/A;   Social History   Socioeconomic History   Marital status: Widowed    Spouse name: Not on file   Number of children: Not on file   Years of education: Not on file   Highest education level: Not on file  Occupational History   Not on file  Tobacco Use   Smoking status: Never   Smokeless tobacco: Never  Vaping Use   Vaping Use: Never used  Substance and Sexual Activity   Alcohol use: Yes    Comment: socially   Drug use: No   Sexual activity: Not on file  Other Topics Concern   Not on file  Social History Narrative   Not on file   Social Determinants of Health   Financial Resource Strain: Not on file  Food Insecurity: Not on file  Transportation Needs: Not on file  Physical Activity: Not on file  Stress: Not on file  Social Connections: Not on file  Intimate Partner Violence: Not on file   Family Status  Relation Name Status   Other  (Not Specified)   Mother  (Not Specified)   Father  (Not Specified)   MGM  (  Not Specified)   PGF  (Not Specified)   Family History  Problem Relation Age of Onset   Cancer Other    Cancer Mother        breast   Cancer Father        protaste   Hypertension Maternal Grandmother    Hypertension Paternal Grandfather    No Known Allergies  Patient Care Team: Jenina Moening, [name], NP as PCP - General (Internal Medicine)   Medications: Outpatient Medications Prior to Visit  Medication Sig   acetaminophen (TYLENOL) 325 MG tablet Take 650 mg by mouth every 6 (six) hours as needed for moderate pain.   aspirin EC 81 MG tablet Take 81 mg by mouth daily. Swallow whole.   atorvastatin (LIPITOR) 80 MG tablet TAKE 1 TABLET BY MOUTH EVERYDAY AT BEDTIME   ferrous gluconate (FERGON) 324 MG tablet TAKE 1 TABLET BY MOUTH DAILY WITH BREAKFAST   [DISCONTINUED] [facility] ASPIRIN ADULT LOW DOSE 81  MG chewable tablet CHEW 1 TABLET BY MOUTH DAILY. (Patient not taking: Reported on 06/14/2022)   [DISCONTINUED] [facility] VITAMIN B12 1000 MCG tablet TAKE 1 TABLET BY MOUTH EVERY DAY (Patient not taking: Reported on 06/14/2022)   [DISCONTINUED] ketoconazole (NIZORAL) 2 % shampoo Apply 1 application topically 2 (two) times a week. (Patient not taking: Reported on 11/20/2021)   [DISCONTINUED] oxyCODONE (OXY IR/ROXICODONE) 5 MG immediate release tablet Take 1 tablet (5 mg total) by mouth every 6 (six) hours as needed for severe pain. (Patient not taking: Reported on 06/14/2022)   No facility-administered medications prior to visit.    Review of Systems  Constitutional:  Negative for fever.  HENT:  Negative for congestion and sore throat.   Eyes:        Negative for visual changes  Respiratory:  Negative for cough and shortness of breath.   Cardiovascular:  Negative for chest pain, palpitations and leg swelling.  Gastrointestinal:  Negative for blood in stool, constipation and diarrhea.  Genitourinary:  Negative for dysuria, frequency and urgency.  Musculoskeletal:  Negative for myalgias.  Skin:  Negative for rash.  Neurological:  Negative for dizziness and headaches.  Hematological:  Does not bruise/bleed easily.  Psychiatric/Behavioral:  Negative for suicidal ideas. The patient is not nervous/anxious.         Objective:  BP 130/80   Pulse 91   Temp (!) 97.2 F (36.2 C) (Temporal)   Ht '4\' 11"'$  (1.499 m)   Wt 284 lb 3.2 oz (128.9 kg)   LMP 06/08/2022 (Exact Date)   SpO2 98%   BMI 57.40 kg/m     Physical Exam Constitutional:      General: She is not in acute distress.    Appearance: She is obese.  HENT:     Right Ear: Tympanic membrane, ear canal and external ear normal.     Left Ear: Tympanic membrane, ear canal and external ear normal.     Nose: Nose normal.     Mouth/Throat:     Pharynx: No oropharyngeal exudate.  Eyes:     General: No scleral icterus.    Extraocular  Movements: Extraocular movements intact.     Conjunctiva/sclera: Conjunctivae normal.     Pupils: Pupils are equal, round, and reactive to light.  Cardiovascular:     Rate and Rhythm: Normal rate and regular rhythm.     Pulses: Normal pulses.     Heart sounds: Normal heart sounds.  Pulmonary:     Effort: Pulmonary effort is normal. No respiratory distress.  Breath sounds: Normal breath sounds.  Abdominal:     General: Bowel sounds are normal. There is no distension.     Palpations: Abdomen is soft.  Musculoskeletal:        General: Normal range of motion.     Cervical back: Normal range of motion and neck supple.     Right lower leg: No edema.     Left lower leg: No edema.  Lymphadenopathy:     Cervical: No cervical adenopathy.  Skin:    General: Skin is warm and dry.  Neurological:     Mental Status: She is alert and oriented to person, place, and time.  Psychiatric:        Mood and Affect: Mood normal.        Behavior: Behavior normal.        Thought Content: Thought content normal.      No results found for any visits on 06/14/22.    Assessment & Plan:    Routine Health Maintenance and Physical Exam  Immunization History  Administered Date(s) Administered   PFIZER(Purple Top)SARS-COV-2 Vaccination 03/18/2020, 04/08/2020   Tdap 06/09/2021   Health Maintenance  Topic Date Due   COLONOSCOPY (Pts 45-43yr Insurance coverage will need to be confirmed)  Never done   PAP SMEAR-Modifier  01/12/2018   COVID-19 Vaccine (3 - Pfizer risk series) 06/30/2022 (Originally 05/06/2020)   INFLUENZA VACCINE  10/03/2022 (Originally 02/02/2022)   Hepatitis C Screening  06/15/2023 (Originally 10/28/1990)   HIV Screening  06/15/2023 (Originally 10/28/1987)   DTaP/Tdap/Td (2 - Td or Tdap) 06/10/2031   HPV VACCINES  Aged Out   Discussed health benefits of physical activity, and encouraged her to engage in regular exercise appropriate for her age and condition.  Problem List Items  Addressed This Visit       Other   Iron deficiency anemia due to chronic blood loss    Repeat cbc and iron panel Reports nausea and constipation with oral iron supplement.      Relevant Orders   CBC with Differential/Platelet   Iron, TIBC and Ferritin Panel   Morbid obesity ([place]   Relevant Orders   Lipid panel   Hemoglobin A1c   Other Visit Diagnoses     Encounter for preventative adult health care exam with abnormal findings    -  Primary   Relevant Orders   Lipid panel   Comprehensive metabolic panel   Colon cancer screening       Relevant Orders   Ambulatory referral to Gastroenterology      Return in about 3 months (around 09/13/2022) for Weight management.     CWilfred Lacy NP

## 2022-06-14 NOTE — Patient Instructions (Signed)
Schedule appt with GYN for repeat PAP. Have report faxed to me. Maintain heart healthy diet and daily exercise Schedule appt with GI for colonoscopy Go to lab  Preventive Care 49-49 Years Old, Female Preventive care refers to lifestyle choices and visits with your health care provider that can promote health and wellness. Preventive care visits are also called wellness exams. What can I expect for my preventive care visit? Counseling Your health care provider may ask you questions about your: Medical history, including: Past medical problems. Family medical history. Pregnancy history. Current health, including: Menstrual cycle. Method of birth control. Emotional well-being. Home life and relationship well-being. Sexual activity and sexual health. Lifestyle, including: Alcohol, nicotine or tobacco, and drug use. Access to firearms. Diet, exercise, and sleep habits. Work and work Statistician. Sunscreen use. Safety issues such as seatbelt and bike helmet use. Physical exam Your health care provider will check your: Height and weight. These may be used to calculate your BMI (body mass index). BMI is a measurement that tells if you are at a healthy weight. Waist circumference. This measures the distance around your waistline. This measurement also tells if you are at a healthy weight and may help predict your risk of certain diseases, such as type 2 diabetes and high blood pressure. Heart rate and blood pressure. Body temperature. Skin for abnormal spots. What immunizations do I need?  Vaccines are usually given at various ages, according to a schedule. Your health care provider will recommend vaccines for you based on your age, medical history, and lifestyle or other factors, such as travel or where you work. What tests do I need? Screening Your health care provider may recommend screening tests for certain conditions. This may include: Lipid and cholesterol levels. Diabetes  screening. This is done by checking your blood sugar (glucose) after you have not eaten for a while (fasting). Pelvic exam and Pap test. Hepatitis B test. Hepatitis C test. HIV (human immunodeficiency virus) test. STI (sexually transmitted infection) testing, if you are at risk. Lung cancer screening. Colorectal cancer screening. Mammogram. Talk with your health care provider about when you should start having regular mammograms. This may depend on whether you have a family history of breast cancer. BRCA-related cancer screening. This may be done if you have a family history of breast, ovarian, tubal, or peritoneal cancers. Bone density scan. This is done to screen for osteoporosis. Talk with your health care provider about your test results, treatment options, and if necessary, the need for more tests. Follow these instructions at home: Eating and drinking  Eat a diet that includes fresh fruits and vegetables, whole grains, lean protein, and low-fat dairy products. Take vitamin and mineral supplements as recommended by your health care provider. Do not drink alcohol if: Your health care provider tells you not to drink. You are pregnant, may be pregnant, or are planning to become pregnant. If you drink alcohol: Limit how much you have to 0-1 drink a day. Know how much alcohol is in your drink. In the [place] one drink equals one 12 oz bottle of beer (355 mL), one 5 oz glass of wine (148 mL), or one 1 oz glass of hard liquor (44 mL). Lifestyle Brush your teeth every morning and night with fluoride toothpaste. Floss one time each day. Exercise for at least 30 minutes 5 or more days each week. Do not use any products that contain nicotine or tobacco. These products include cigarettes, chewing tobacco, and vaping devices, such as e-cigarettes. If you  need help quitting, ask your health care provider. Do not use drugs. If you are sexually active, practice safe sex. Use a condom or other form of  protection to prevent STIs. If you do not wish to become pregnant, use a form of birth control. If you plan to become pregnant, see your health care provider for a prepregnancy visit. Take aspirin only as told by your health care provider. Make sure that you understand how much to take and what form to take. Work with your health care provider to find out whether it is safe and beneficial for you to take aspirin daily. Find healthy ways to manage stress, such as: Meditation, yoga, or listening to music. Journaling. Talking to a trusted person. Spending time with friends and family. Minimize exposure to UV radiation to reduce your risk of skin cancer. Safety Always wear your seat belt while driving or riding in a vehicle. Do not drive: If you have been drinking alcohol. Do not ride with someone who has been drinking. When you are tired or distracted. While texting. If you have been using any mind-altering substances or drugs. Wear a helmet and other protective equipment during sports activities. If you have firearms in your house, make sure you follow all gun safety procedures. Seek help if you have been physically or sexually abused. What's next? Visit your health care provider once a year for an annual wellness visit. Ask your health care provider how often you should have your eyes and teeth checked. Stay up to date on all vaccines. This information is not intended to replace advice given to you by your health care provider. Make sure you discuss any questions you have with your health care provider. Document Revised: 12/17/2020 Document Reviewed: 12/17/2020 Elsevier Patient Education  [place].

## 2022-06-14 NOTE — Assessment & Plan Note (Signed)
Repeat cbc and iron panel Reports nausea and constipation with oral iron supplement.

## 2022-06-15 LAB — IRON,TIBC AND FERRITIN PANEL
%SAT: 9 % (calc) — ABNORMAL LOW (ref 16–45)
Ferritin: 14 ng/mL — ABNORMAL LOW (ref 16–232)
Iron: 34 ug/dL — ABNORMAL LOW (ref 40–190)
TIBC: 358 mcg/dL (calc) (ref 250–450)

## 2022-06-16 NOTE — Addendum Note (Signed)
Addended by: Wilfred Lacy L on: 06/16/2022 12:48 PM   Modules accepted: Orders

## 2022-07-23 LAB — HM PAP SMEAR

## 2022-07-28 ENCOUNTER — Telehealth: Payer: Self-pay | Admitting: *Deleted

## 2022-07-28 NOTE — Telephone Encounter (Signed)
Called patient again and was able to leave a message to call back to schedule OV with Dr. Silverio Decamp or APP

## 2022-07-28 NOTE — Telephone Encounter (Signed)
Dr Silverio Decamp,  I am prepping charts for pre-visit and this patients BMI is 57.37. I phoned pt and she is aware that procedure for [facility] is cancelled.  Does she need an office visit or is it ok to schedule her directly at the hospital?    Thanks,  Lanelle Bal

## 2022-07-28 NOTE — Telephone Encounter (Signed)
Attempted to call pt to schedule OV.  Vm box was full and unable to leave a message.  Will try again later.

## 2022-07-28 NOTE — Telephone Encounter (Signed)
Please schedule this visit next available appointment either with APP.  Thank you

## 2022-07-29 NOTE — Telephone Encounter (Signed)
Was able to reach patient.  Scheduled her to see A. Esterwood 2/16 @ 830am.

## 2022-08-13 ENCOUNTER — Encounter: Payer: Self-pay | Admitting: *Deleted

## 2022-08-16 ENCOUNTER — Encounter: Payer: Self-pay | Admitting: Nurse Practitioner

## 2022-08-16 ENCOUNTER — Ambulatory Visit (INDEPENDENT_AMBULATORY_CARE_PROVIDER_SITE_OTHER): Payer: No Typology Code available for payment source | Admitting: Nurse Practitioner

## 2022-08-16 VITALS — BP 120/72 | HR 75 | Temp 98.2°F | Resp 16 | Ht 59.0 in | Wt 274.0 lb

## 2022-08-16 DIAGNOSIS — I10 Essential (primary) hypertension: Secondary | ICD-10-CM | POA: Diagnosis present

## 2022-08-16 DIAGNOSIS — Z6841 Body Mass Index (BMI) 40.0 and over, adult: Secondary | ICD-10-CM | POA: Diagnosis not present

## 2022-08-16 DIAGNOSIS — R7303 Prediabetes: Secondary | ICD-10-CM

## 2022-08-16 DIAGNOSIS — D500 Iron deficiency anemia secondary to blood loss (chronic): Secondary | ICD-10-CM | POA: Diagnosis not present

## 2022-08-16 DIAGNOSIS — R030 Elevated blood-pressure reading, without diagnosis of hypertension: Secondary | ICD-10-CM

## 2022-08-16 NOTE — Assessment & Plan Note (Signed)
Home BP readings with wrist cuff: 115/74, 129/77, 136/86, 127/60, 142/86, 134/69, 133/62, 121/66, 138/67, 125/61, 130/72. BP Readings from Last 3 Encounters:  08/16/22 120/72  06/14/22 130/80  12/03/21 121/83    Advised to switch to upper arm cuff. Provided measurement for right cuff size. Continue to monitor Bp at home 3x/week in AM Call office if BP >140/80. Continue heart healthy diet and daily exercise

## 2022-08-16 NOTE — Progress Notes (Signed)
Established Patient Visit  Patient: Renee Hahn   DOB: Nov 08, 1970   52 y.o. Female  MRN: [id number] Visit Date: 08/16/2022  Subjective:    Chief Complaint  Patient presents with   Office visit    Fluxuating BP  This week    HPI Elevated BP without diagnosis of hypertension Home BP readings with wrist cuff: 115/74, 129/77, 136/86, 127/60, 142/86, 134/69, 133/62, 121/66, 138/67, 125/61, 130/72. BP Readings from Last 3 Encounters:  08/16/22 120/72  06/14/22 130/80  12/03/21 121/83    Advised to switch to upper arm cuff. Provided measurement for right cuff size. Continue to monitor Bp at home 3x/week in AM Call office if BP >140/80. Continue heart healthy diet and daily exercise  Iron deficiency anemia due to chronic blood loss Repeat iron panel and cbc  Morbid obesity (HCC) Lost 10lbs in last 36monththorugh lifestyle modifications: home cooked meals with recipes from weight watcher and daily exercise. BP Readings from Last 3 Encounters:  08/16/22 120/72  06/14/22 130/80  12/03/21 121/83    Encouraged to continue lifestyle modifications Repeat hgbA1c in 12month/up in 55m84month  Reviewed medical, surgical, and social history today  Medications: Outpatient Medications Prior to Visit  Medication Sig   acetaminophen (TYLENOL) 325 MG tablet Take 650 mg by mouth every 6 (six) hours as needed for moderate pain.   aspirin EC 81 MG tablet Take 81 mg by mouth daily. Swallow whole.   atorvastatin (LIPITOR) 80 MG tablet TAKE 1 TABLET BY MOUTH EVERYDAY AT BEDTIME   ferrous gluconate (FERGON) 324 MG tablet TAKE 1 TABLET BY MOUTH DAILY WITH BREAKFAST   No facility-administered medications prior to visit.   Reviewed past medical and social history.   ROS per HPI above      Objective:  BP 120/72 (BP Location: Right Arm, Patient Position: Sitting, Cuff Size: Large)   Pulse 75   Temp 98.2 F (36.8 C) (Temporal)   Resp 16   Ht 4' 11"$  (1.499 m)   Wt 274 lb  (124.3 kg)   SpO2 99%   BMI 55.34 kg/m      Physical Exam Cardiovascular:     Rate and Rhythm: Normal rate and regular rhythm.     Pulses: Normal pulses.     Heart sounds: Normal heart sounds.  Pulmonary:     Effort: Pulmonary effort is normal.     Breath sounds: Normal breath sounds.  Musculoskeletal:     Right lower leg: No edema.     Left lower leg: No edema.  Neurological:     Mental Status: She is alert and oriented to person, place, and time.     No results found for any visits on 08/16/22.    Assessment & Plan:    Problem List Items Addressed This Visit       Other   BMI 50.0-59.9, adult ([place]   Relevant Orders   TSH   Elevated BP without diagnosis of hypertension    Home BP readings with wrist cuff: 115/74, 129/77, 136/86, 127/60, 142/86, 134/69, 133/62, 121/66, 138/67, 125/61, 130/72. BP Readings from Last 3 Encounters:  08/16/22 120/72  06/14/22 130/80  12/03/21 121/83    Advised to switch to upper arm cuff. Provided measurement for right cuff size. Continue to monitor Bp at home 3x/week in AM Call office if BP >140/80. Continue heart healthy diet and daily exercise  Iron deficiency anemia due to chronic blood loss    Repeat iron panel and cbc      Relevant Orders   Iron, TIBC and Ferritin Panel   CBC   Morbid obesity ([place])    Lost 10lbs in last 81monththorugh lifestyle modifications: home cooked meals with recipes from weight watcher and daily exercise. BP Readings from Last 3 Encounters:  08/16/22 120/72  06/14/22 130/80  12/03/21 121/83    Encouraged to continue lifestyle modifications Repeat hgbA1c in 116month/up in 66m49month    Relevant Orders   TSH   Other Visit Diagnoses     Prediabetes       Relevant Orders   Hemoglobin A1c      Return in about 3 months (around 11/14/2022) for Weight management and pediabetes.     ChaWilfred LacyP

## 2022-08-16 NOTE — Patient Instructions (Signed)
No concern about BP today Continue to monitor Bp at home 3x/week in AM Call office if BP >140/80. Continue heart healthy diet and daily exercise Need 76monthlab appt. Need 361monthf/up appt with me Cancel 09/13/2022 appt

## 2022-08-16 NOTE — Assessment & Plan Note (Signed)
>>  ASSESSMENT AND PLAN FOR ELEVATED BP WITHOUT DIAGNOSIS OF HYPERTENSION WRITTEN ON 08/16/2022  2:56 PM BY Josia Cueva LUM, NP  Home BP readings with wrist cuff: 115/74, 129/77, 136/86, 127/60, 142/86, 134/69, 133/62, 121/66, 138/67, 125/61, 130/72. BP Readings from Last 3 Encounters:  08/16/22 120/72  06/14/22 130/80  12/03/21 121/83    Advised to switch to upper arm cuff. Provided measurement for right cuff size. Continue to monitor Bp at home 3x/week in AM Call office if BP >140/80. Continue heart healthy diet and daily exercise

## 2022-08-16 NOTE — Assessment & Plan Note (Signed)
Repeat iron panel and cbc

## 2022-08-16 NOTE — Assessment & Plan Note (Addendum)
Lost 10lbs in last 47monththorugh lifestyle modifications: home cooked meals with recipes from weight watcher and daily exercise. Wt Readings from Last 3 Encounters:  08/16/22 274 lb (124.3 kg)  06/14/22 284 lb 3.2 oz (128.9 kg)  12/03/21 276 lb (125.2 kg)    BP Readings from Last 3 Encounters:  08/16/22 120/72  06/14/22 130/80  12/03/21 121/83    Encouraged to continue lifestyle modifications Repeat hgbA1c in 116month/up in 64m40month

## 2022-08-20 ENCOUNTER — Ambulatory Visit (INDEPENDENT_AMBULATORY_CARE_PROVIDER_SITE_OTHER): Payer: No Typology Code available for payment source | Admitting: Physician Assistant

## 2022-08-20 ENCOUNTER — Encounter: Payer: Self-pay | Admitting: Physician Assistant

## 2022-08-20 VITALS — BP 136/70 | HR 64 | Ht 59.0 in | Wt 272.6 lb

## 2022-08-20 DIAGNOSIS — Z1211 Encounter for screening for malignant neoplasm of colon: Secondary | ICD-10-CM

## 2022-08-20 NOTE — Progress Notes (Signed)
Subjective:    Patient ID: Renee Hahn, female    DOB: [birth year], 52 y.o.   MRN: [id number]  HPI  Renee Hahn is a pleasant 51 year old female, new to GI today referred by Wilfred Lacy NP/family practice for screening colonoscopy Patient has not had any prior history of GI evaluation. She does have history of morbid obesity with BMI of 55, hyperlipidemia, prior history of left MCA CVA 2022 status post thrombectomy and maintained on baby aspirin, status postcholecystectomy 2021, and status post lumpectomy on the left in 2023/benign.  She also has history of iron deficiency anemia felt secondary to menorrhagia, she stays on oral iron.  Patient reports that she is undergoing evaluation with GYN and is scheduled to have an endometrial biopsy in a few weeks, then possible ablation. She does not currently have any complaints of changes in bowel habits, no chronic problems with constipation or diarrhea, no melena or hematochezia and no complaints of abdominal pain. Family history is negative for colon cancer. Most recent labs December 2023 WBC 9.1/hemoglobin 12.8/hematocrit 39.9/MCV 74.6 Serum iron 34/TIBC 358/iron sat 9.  Per notes has been referred to hematology for iron replacement.  Review of Systems Pertinent positive and negative review of systems were noted in the above HPI section.  All other review of systems was otherwise negative.   Outpatient Encounter Medications as of 08/20/2022  Medication Sig   acetaminophen (TYLENOL) 325 MG tablet Take 650 mg by mouth every 6 (six) hours as needed for moderate pain.   aspirin EC 81 MG tablet Take 81 mg by mouth daily. Swallow whole.   atorvastatin (LIPITOR) 80 MG tablet TAKE 1 TABLET BY MOUTH EVERYDAY AT BEDTIME   ferrous gluconate (FERGON) 324 MG tablet TAKE 1 TABLET BY MOUTH DAILY WITH BREAKFAST   No facility-administered encounter medications on file as of 08/20/2022.   No Known Allergies Patient Active Problem List   Diagnosis Date Noted    Elevated BP without diagnosis of hypertension 08/16/2022   Prediabetes 08/16/2022   History of cerebrovascular accident (CVA) with residual deficit 03/02/2021   Anemia 02/20/2021   BMI 50.0-59.9, adult ([facility]) 01/28/2020   Morbid obesity ([facility]) 01/24/2020   Menorrhagia 12/18/2019   Iron deficiency anemia due to chronic blood loss 12/17/2019   Seborrhea 12/17/2019   S/P laparoscopic cholecystectomy 09/21/2019   Social History   Socioeconomic History   Marital status: Widowed    Spouse name: Not on file   Number of children: Not on file   Years of education: Not on file   Highest education level: Not on file  Occupational History   Not on file  Tobacco Use   Smoking status: Never   Smokeless tobacco: Never  Vaping Use   Vaping Use: Never used  Substance and Sexual Activity   Alcohol use: Yes    Comment: socially   Drug use: No   Sexual activity: Not on file  Other Topics Concern   Not on file  Social History Narrative   Not on file   Social Determinants of Health   Financial Resource Strain: Not on file  Food Insecurity: Not on file  Transportation Needs: Not on file  Physical Activity: Not on file  Stress: Not on file  Social Connections: Not on file  Intimate Partner Violence: Not on file    Ms. Gockel's family history includes Breast cancer in her mother; Cancer in an other family member; Hypertension in her maternal grandmother and paternal grandfather; Prostate cancer in her father.  Objective:    Vitals:   08/20/22 0840  BP: 136/70  Pulse: 64  SpO2: 100%    Physical Exam Well-developed  obese AA female in no acute distress.  Very pleasant Height, [id number]  BMI 55.06  HEENT; nontraumatic normocephalic, EOMI, PE R LA, sclera anicteric. Oropharynx;not examined today Neck; supple, no JVD Cardiovascular; regular rate and rhythm with S1-S2, no murmur rub or gallop Pulmonary; Clear bilaterally Abdomen; soft, morbidly obese  nondistended, no  palpable mass or hepatosplenomegaly, bowel sounds are active Rectal; not done today  Skin; benign exam, no jaundice rash or appreciable lesions Extremities; no clubbing cyanosis or edema skin warm and dry Neuro/Psych; alert and oriented x4, grossly nonfocal mood and affect appropriate        Assessment & Plan:   #21 52 year old African-American female referred for colon cancer screening, asymptomatic and negative family history  #2 morbid obesity with BMI 55 #3 history of chronic iron deficiency anemia secondary to menorrhagia-scheduled for endometrial biopsy and then possible ablation #4 hyperlipidemia #5.  History of left MCA CVA 2022 status post thrombectomy and on baby aspirin daily #6 status post cholecystectomy  Plan; Patient will be scheduled for colonoscopy with Dr. Silverio Decamp  at the hospital due to BMI.  Procedure was discussed in detail with the patient including indications risk and benefits and she is agreeable to proceed.  Unfortunately Dr. Silverio Decamp does not have any availability through April.  Patient will be contacted and scheduled for colonoscopy in May once that schedule becomes available.  Kaius Daino Genia Harold PA-C 08/20/2022   Cc: Flossie Buffy, NP

## 2022-08-20 NOTE — Patient Instructions (Signed)
We will contact you for a hospital date as soon as one becomes available   _______________________________________________________  If your blood pressure at your visit was 140/90 or greater, please contact your primary care physician to follow up on this.  _______________________________________________________  If you are age 50 or older, your body mass index should be between 23-30. Your Body mass index is 55.06 kg/m. If this is out of the aforementioned range listed, please consider follow up with your Primary Care Provider.  If you are age 80 or younger, your body mass index should be between 19-25. Your Body mass index is 55.06 kg/m. If this is out of the aformentioned range listed, please consider follow up with your Primary Care Provider.   ________________________________________________________  The [facility] providers would like to encourage you to use [facility] to communicate with providers for non-urgent requests or questions.  Due to long hold times on the telephone, sending your provider a message by [facility] may be a faster and more efficient way to get a response.  Please allow 48 business hours for a response.  Please remember that this is for non-urgent requests.  _______________________________________________________  Thank you for trusting me with your gastrointestinal care!   Amy Esterwood PA-C

## 2022-08-25 ENCOUNTER — Encounter: Payer: No Typology Code available for payment source | Admitting: Gastroenterology

## 2022-09-01 ENCOUNTER — Other Ambulatory Visit: Payer: Self-pay | Admitting: Surgery

## 2022-09-01 DIAGNOSIS — Z1231 Encounter for screening mammogram for malignant neoplasm of breast: Secondary | ICD-10-CM

## 2022-09-02 ENCOUNTER — Other Ambulatory Visit: Payer: Self-pay | Admitting: Obstetrics and Gynecology

## 2022-09-03 ENCOUNTER — Telehealth: Payer: Self-pay

## 2022-09-03 NOTE — Telephone Encounter (Signed)
Ppt was contacted, offered may 13th for procedure with nandigam in hospital, pt request to call back to schedule.

## 2022-09-03 NOTE — Telephone Encounter (Signed)
-----   Message from Ruthe Mannan, [place] sent at 08/20/2022  9:13 AM EST ----- Nandigam patient- needs hospital colonoscopy (bmi >50) for screening. Currently no dates available thru 4/18. Call pt to schedule. Give instructions. Per amy, stay ON baby aspirin

## 2022-09-07 ENCOUNTER — Ambulatory Visit
Admission: RE | Admit: 2022-09-07 | Discharge: 2022-09-07 | Disposition: A | Discharge status: Home / Self Care | Payer: No Typology Code available for payment source | Source: Ambulatory Visit | Attending: Surgery | Admitting: Surgery

## 2022-09-07 DIAGNOSIS — Z1231 Encounter for screening mammogram for malignant neoplasm of breast: Secondary | ICD-10-CM

## 2022-09-09 ENCOUNTER — Other Ambulatory Visit: Payer: Self-pay | Admitting: Nurse Practitioner

## 2022-09-13 ENCOUNTER — Ambulatory Visit: Payer: No Typology Code available for payment source | Admitting: Nurse Practitioner

## 2022-09-13 ENCOUNTER — Other Ambulatory Visit (INDEPENDENT_AMBULATORY_CARE_PROVIDER_SITE_OTHER): Payer: No Typology Code available for payment source

## 2022-09-13 DIAGNOSIS — R7303 Prediabetes: Secondary | ICD-10-CM

## 2022-09-13 DIAGNOSIS — Z6841 Body Mass Index (BMI) 40.0 and over, adult: Secondary | ICD-10-CM

## 2022-09-13 DIAGNOSIS — D500 Iron deficiency anemia secondary to blood loss (chronic): Secondary | ICD-10-CM

## 2022-09-13 LAB — CBC
HCT: 35.7 % — ABNORMAL LOW (ref 36.0–46.0)
Hemoglobin: 11.5 g/dL — ABNORMAL LOW (ref 12.0–15.0)
MCHC: 32.2 g/dL (ref 30.0–36.0)
MCV: 76 fl — ABNORMAL LOW (ref 78.0–100.0)
Platelets: 259 10*3/uL (ref 150.0–400.0)
RBC: 4.7 Mil/uL (ref 3.87–5.11)
RDW: 18 % — ABNORMAL HIGH (ref 11.5–15.5)
WBC: 7.9 10*3/uL (ref 4.0–10.5)

## 2022-09-13 LAB — TSH: TSH: 1.95 u[IU]/mL (ref 0.35–5.50)

## 2022-09-13 LAB — HEMOGLOBIN A1C: Hgb A1c MFr Bld: 5.3 % (ref 4.6–6.5)

## 2022-09-13 NOTE — Progress Notes (Signed)
Pt is here for labs   

## 2022-09-14 LAB — IRON,TIBC AND FERRITIN PANEL
%SAT: 9 % (calc) — ABNORMAL LOW (ref 16–45)
Ferritin: 10 ng/mL — ABNORMAL LOW (ref 16–232)
Iron: 31 ug/dL — ABNORMAL LOW (ref 40–190)
TIBC: 351 mcg/dL (calc) (ref 250–450)

## 2022-09-14 NOTE — Progress Notes (Signed)
Abnormal: Persistent iron deficient anemia. Entered referral to hematology. Discuss menorrhagia with GYN. Normal hgbA1c and TSh

## 2022-09-14 NOTE — Addendum Note (Signed)
Addended by: Leana Gamer on: 09/14/2022 07:28 PM   Modules accepted: Orders

## 2022-10-05 NOTE — Progress Notes (Deleted)
[facility] OFFICE PROGRESS NOTE  Hahn, Renee Brooke, NP [place] [address]  DIAGNOSIS: Iron deficiency anemia secondary to menorrhagia.   PRIOR THERAPY: Iron infusion with Venofer weekly for 4 weeks. First dose was on 01/18/2020.   CURRENT THERAPY: Ferrous gluconate 325 mg p.o. daily. ***  INTERVAL HISTORY: Renee Hahn 52 y.o. female returns to the clinic today for follow-up visit.  The patient has been lost to follow-up since October 2021.  She is followed for iron deficiency secondary to menorrhagia.  She received IV iron as needed with Venofer the most recent being in July 2021.  She also was on oral iron supplement which she was compliant with.  Since last being seen, the patient is continuing to take her iron??  She is scheduled with her GYN, Dr. Landry Mellow, for hysteroscopy and ablation on 11/17/2022.  She is also scheduled for a colonoscopy by Dr. Bonnita Nasuti again at the hospital secondary to her BMI and***.  Otherwise she denies any abnormal bleeding or bruising including epistaxis, gingival bleeding, hemoptysis, hematemesis, melena, or hematochezia.  Lightheadedness?  Fatigue?  Shortness of breath?  Menorrhagia?  She had lab work performed at her PCPs office on 09/13/2022 which showed microcytic anemia with a hemoglobin slightly low at 11.5, MCV low at 76, low iron at 31, low saturation 9%, and low ferritin at 10.  She was rereferred to the clinic to reestablish care.  MEDICAL HISTORY: Past Medical History:  Diagnosis Date   Cholelithiasis    Hay fever    Headache    Stroke [facility])    Vitamin B12 deficiency 02/27/2021    ALLERGIES:  has No Known Allergies.  MEDICATIONS:  Current Outpatient Medications  Medication Sig Dispense Refill   acetaminophen (TYLENOL) 325 MG tablet Take 650 mg by mouth every 6 (six) hours as needed for moderate pain.     aspirin EC 81 MG tablet Take 81 mg by mouth daily. Swallow whole.     atorvastatin (LIPITOR) 80 MG  tablet TAKE 1 TABLET BY MOUTH EVERYDAY AT BEDTIME 90 tablet 2   ferrous gluconate (FERGON) 324 MG tablet TAKE 1 TABLET BY MOUTH DAILY WITH BREAKFAST 30 tablet 11   No current facility-administered medications for this visit.    SURGICAL HISTORY:  Past Surgical History:  Procedure Laterality Date   BREAST BIOPSY     BREAST EXCISIONAL BIOPSY Left 12/03/2021   benign   BREAST LUMPECTOMY WITH RADIOACTIVE SEED LOCALIZATION Left 12/03/2021   Procedure: LEFT BREAST LUMPECTOMY WITH RADIOACTIVE SEED LOCALIZATION;  Surgeon: Erroll Luna, MD;  Location: [facility];  Service: General;  Laterality: Left;   CHOLECYSTECTOMY N/A 09/21/2019   Procedure: LAPAROSCOPIC CHOLECYSTECTOMY WITH INTRAOPERATIVE CHOLANGIOGRAM;  Surgeon: Erroll Luna, MD;  Location: [facility];  Service: General;  Laterality: N/A;    REVIEW OF SYSTEMS:   Review of Systems  Constitutional: Negative for appetite change, chills, fatigue, fever and unexpected weight change.  HENT:   Negative for mouth sores, nosebleeds, sore throat and trouble swallowing.   Eyes: Negative for eye problems and icterus.  Respiratory: Negative for cough, hemoptysis, shortness of breath and wheezing.   Cardiovascular: Negative for chest pain and leg swelling.  Gastrointestinal: Negative for abdominal pain, constipation, diarrhea, nausea and vomiting.  Genitourinary: Negative for bladder incontinence, difficulty urinating, dysuria, frequency and hematuria.   Musculoskeletal: Negative for back pain, gait problem, neck pain and neck stiffness.  Skin: Negative for itching and rash.  Neurological: Negative for dizziness, extremity weakness, gait problem,  headaches, light-headedness and seizures.  Hematological: Negative for adenopathy. Does not bruise/bleed easily.  Psychiatric/Behavioral: Negative for confusion, depression and sleep disturbance. The patient is not nervous/anxious.     PHYSICAL EXAMINATION:  There were no vitals taken for this visit.  ECOG  PERFORMANCE STATUS: {CHL ONC ECOG [id number]  Physical Exam  Constitutional: Oriented to person, place, and time and well-developed, well-nourished, and in no distress. No distress.  HENT:  Head: Normocephalic and atraumatic.  Mouth/Throat: Oropharynx is clear and moist. No oropharyngeal exudate.  Eyes: Conjunctivae are normal. Right eye exhibits no discharge. Left eye exhibits no discharge. No scleral icterus.  Neck: Normal range of motion. Neck supple.  Cardiovascular: Normal rate, regular rhythm, normal heart sounds and intact distal pulses.   Pulmonary/Chest: Effort normal and breath sounds normal. No respiratory distress. No wheezes. No rales.  Abdominal: Soft. Bowel sounds are normal. Exhibits no distension and no mass. There is no tenderness.  Musculoskeletal: Normal range of motion. Exhibits no edema.  Lymphadenopathy:    No cervical adenopathy.  Neurological: Alert and oriented to person, place, and time. Exhibits normal muscle tone. Gait normal. Coordination normal.  Skin: Skin is warm and dry. No rash noted. Not diaphoretic. No erythema. No pallor.  Psychiatric: Mood, memory and judgment normal.  Vitals reviewed.  LABORATORY DATA: Lab Results  Component Value Date   WBC 7.9 09/13/2022   HGB 11.5 (L) 09/13/2022   HCT 35.7 (L) 09/13/2022   MCV 76.0 (L) 09/13/2022   PLT 259.0 09/13/2022      Chemistry      Component Value Date/Time   NA 138 06/14/2022 0908   K 4.2 06/14/2022 0908   CL 103 06/14/2022 0908   CO2 27 06/14/2022 0908   BUN 11 06/14/2022 0908   CREATININE 0.68 06/14/2022 0908   CREATININE 0.82 12/18/2019 1431      Component Value Date/Time   CALCIUM 8.9 06/14/2022 0908   ALKPHOS 103 06/14/2022 0908   AST 34 06/14/2022 0908   AST 66 (H) 12/18/2019 1431   ALT 18 06/14/2022 0908   ALT 15 12/18/2019 1431   BILITOT 0.3 06/14/2022 0908   BILITOT 0.6 12/18/2019 1431       RADIOGRAPHIC STUDIES:  MM 3D SCREEN BREAST BILATERAL  Result Date:  09/09/2022 CLINICAL DATA:  Screening. EXAM: DIGITAL SCREENING BILATERAL MAMMOGRAM WITH TOMOSYNTHESIS AND CAD TECHNIQUE: Bilateral screening digital craniocaudal and mediolateral oblique mammograms were obtained. Bilateral screening digital breast tomosynthesis was performed. The images were evaluated with computer-aided detection. COMPARISON:  Previous exam(s). ACR Breast Density Category c: The breasts are heterogeneously dense, which may obscure small masses. FINDINGS: There are no findings suspicious for malignancy. IMPRESSION: No mammographic evidence of malignancy. A result letter of this screening mammogram will be mailed directly to the patient. RECOMMENDATION: Screening mammogram in one year. (Code:SM-B-01Y) BI-RADS CATEGORY  1: Negative. Electronically Signed   By: Kristopher Oppenheim M.D.   On: 09/09/2022 14:24     ASSESSMENT/PLAN:  Is a very pleasant 52 year old African-American female with a history of iron deficiency anemia secondary to menorrhagia.   Patient has been lost to follow-up since 2021.  The patient has a history of receiving as needed IV iron with Venofer her most recent on 01/18/2020.  She is compliant with her oral iron supplement.  The patient had repeat CBC, iron studies, ferritin, drawn today which shows ***.   Will arrange for IV iron with Venofer 300 mg weekly x 3 at the [facility] infusion center.  She was  also instructed to continue taking her oral iron supplement with vitamin C.  She was also given a handout of iron rich food and she was strongly encouraged to increase her dietary intake of iron rich food.  Will see her back for follow-up visit in 3 months for evaluation and to repeat blood work.  She will continue with Dr. Collene Mares again for a colonoscopy at the hospital.  She will continue to follow with Dr. Landry Mellow from GYN regarding hysteroscopy and ablation which is scheduled for 11/17/2022  The patient was advised to call immediately if she has any concerning  symptoms in the interval. The patient voices understanding of current disease status and treatment options and is in agreement with the current care plan. All questions were answered. The patient knows to call the clinic with any problems, questions or concerns. We can certainly see the patient much sooner if necessary       No orders of the defined types were placed in this encounter.    I spent {CHL ONC TIME VISIT - [id number] counseling the patient face to face. The total time spent in the appointment was {CHL ONC TIME VISIT - [id number].  Renee Townshend L Yessica Putnam, PA-C 10/05/22

## 2022-10-06 ENCOUNTER — Encounter: Payer: Self-pay | Admitting: *Deleted

## 2022-10-06 ENCOUNTER — Other Ambulatory Visit: Payer: Self-pay | Admitting: *Deleted

## 2022-10-06 ENCOUNTER — Telehealth: Payer: Self-pay | Admitting: *Deleted

## 2022-10-06 ENCOUNTER — Telehealth: Payer: Self-pay | Admitting: Physician Assistant

## 2022-10-06 DIAGNOSIS — Z6841 Body Mass Index (BMI) 40.0 and over, adult: Secondary | ICD-10-CM

## 2022-10-06 DIAGNOSIS — Z1211 Encounter for screening for malignant neoplasm of colon: Secondary | ICD-10-CM

## 2022-10-06 MED ORDER — NA SULFATE-K SULFATE-MG SULF 17.5-3.13-1.6 GM/177ML PO SOLN: ORAL | 0 refills | Status: DC

## 2022-10-06 NOTE — Telephone Encounter (Signed)
Patient called to cancel 4/4 appointment. Patient has multiple surgeries coming up and will call back to reschedule.

## 2022-10-06 NOTE — Telephone Encounter (Signed)
Patient scheduled for colonoscopy at [facility] on 01/27/2023 Thursday at 7:30 am , patient aware and new instructions sent in My Chart Patient was already seen in the office on 09/03/2022. No PreVisit Needed

## 2022-10-07 ENCOUNTER — Inpatient Hospital Stay: Payer: No Typology Code available for payment source | Admitting: Physician Assistant

## 2022-10-19 NOTE — Progress Notes (Signed)
Reviewed and agree with documentation and assessment and plan. K. Veena Coleta Grosshans , MD   

## 2022-11-15 ENCOUNTER — Other Ambulatory Visit: Payer: Self-pay

## 2022-11-15 ENCOUNTER — Ambulatory Visit: Payer: No Typology Code available for payment source | Admitting: Nurse Practitioner

## 2022-11-15 ENCOUNTER — Other Ambulatory Visit: Payer: Self-pay | Admitting: Nurse Practitioner

## 2022-11-15 ENCOUNTER — Encounter (HOSPITAL_COMMUNITY): Payer: Self-pay | Admitting: Obstetrics and Gynecology

## 2022-11-15 NOTE — Progress Notes (Signed)
SDW call  Patient was given pre-op instructions over the phone. Patient verbalized understanding of instructions provided.   PCP - Alysia Penna, NP Cardiologist -  Pulmonary:    PPM/ICD - Denies   Chest x-ray - n/a EKG -  12/03/2021 Stress Test - ECHO -  Cardiac Cath -   Sleep Study/sleep apnea/CPAP: Denies  Non-diabetic  Blood Thinner Instructions: Denies Aspirin Instructions: Continue per surgeon   ERAS Protcol - No, NPO PRE-SURGERY Ensure or G2-    COVID TEST- n/a    Anesthesia review: No   Patient denies shortness of breath, fever, cough and chest pain over the phone call  Your procedure is scheduled on Wednesday  Report to [facility] Main Entrance "A" at 0600  A.M., then check in with the Admitting office.  Call this number if you have problems the morning of surgery:  [phone]   If you have any questions prior to your surgery date call [phone]: Open Monday-Friday 8am-4pm If you experience any cold or flu symptoms such as cough, fever, chills, shortness of breath, etc. between now and your scheduled surgery, please notify us at the above number     Remember:  Do not eat or drink after midnight the night before your surgery  Take these medicines the morning of surgery with A SIP OF WATER:  Aspirin As of today, STOP taking any Aleve, Naproxen, Ibuprofen, Motrin, Advil, Goody's, BC's, all herbal medications, fish oil, and all vitamins.

## 2022-11-16 ENCOUNTER — Other Ambulatory Visit: Payer: Self-pay | Admitting: Obstetrics and Gynecology

## 2022-11-16 DIAGNOSIS — N920 Excessive and frequent menstruation with regular cycle: Secondary | ICD-10-CM

## 2022-11-16 NOTE — H&P (Signed)
Current Medications Taking Ferrous Gluconate 324 (38 Fe) MG Tablet 1 tablet Orally twice a day Tylenol(Acetaminophen) 325 MG Tablet 1 tablet as needed Orally every 4 hrs Aspirin 81(Aspirin) 81 MG Tablet Delayed Release 1 tablet Orally Once a day Atorvastatin Calcium 80 MG Tablet 1 tablet Orally Once a day Medication List reviewed and reconciled with the patient Past Medical History Obesity. Anemia. Hypercholesterolemia. Stroke 12/13/20. Surgical History cholecystectomy 09/2019 ?carotid embolization 02/2021 lumpectomy, left breast 12/2021 Family History Father: deceased, cancer prostate, restless leg syndrome Mother: deceased, breast cancer at age 71, diagnosed with Breast cancer Brother 1: alive 55 yrs, A + W Spouse: deceased, 12/14/2019 - major stroke 1 brother(s) - healthy. Social History    General:  Tobacco use  cigarettes:  Never smoked, Tobacco history last updated  11/05/2022, Vaping  No. EXPOSURE TO PASSIVE SMOKE: no. Alcohol: yes, occasionally. Caffeine: no. Recreational drug use: no. Exercise: yes, walking, yoga, barre. Marital Status: widowed. Children: 1 step son. 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 Tobacco Exposure: Husband smokes. Gyn History Sexual activity not currently sexually active.  Periods : regular, last long/heavy.  LMP 10/14/2022.  Denies H/O Birth control.  Last pap smear date 01/2015 - Negative.  Last mammogram date 08/2021 - left breast mass, benign - left breast lumpectomy 12/2021.  Denies H/O Abnormal pap smear.  Denies H/O STD.  Menarche 14.  OB History Never been pregnant  per patient.  Allergies N.K.D.A. Hospitalization/Major Diagnostic Procedure stroke 02/2021 above surgeries Review of Systems    CONSTITUTIONAL:  Chills No.  Fatigue No.  Fever No.  Night sweats No.  Recent travel outside [name] No.  Sweats No.  Weight change No.     OPHTHALMOLOGY:  Blurring of vision no.  Change in vision no.  Double vision no.     ENT:  Dizziness no.   Nose bleeds no.  Sore throat no.  Teeth pain no.     ALLERGY:  Hives no.     CARDIOLOGY:  Chest pain no.  High blood pressure no.  Irregular heart beat no.  Leg edema no.  Palpitations no.     RESPIRATORY:  Shortness of breath no.  Cough no.  Wheezing no.     UROLOGY:  Pain with urination no.  Urinary urgency no.  Urinary frequency no.  Urinary incontinence no.  Difficulty urinating No.  Blood in urine No.     GASTROENTEROLOGY:  Abdominal pain no.  Appetite change no.  Bloating/belching no.  Blood in stool or on toilet paper no.  Change in bowel movements no.  Constipation no.  Diarrhea no.  Difficulty swallowing no.  Nausea no.     FEMALE REPRODUCTIVE:  Vulvar pain no.  Vulvar rash no.  Abnormal vaginal bleeding , heavy menses.  Breast pain no.  Nipple discharge no.  Pain with intercourse no.  Pelvic pain , worse with menstruation.  Unusual vaginal discharge no.  Vaginal itching no.     MUSCULOSKELETAL:  Muscle aches no.     NEUROLOGY:  Headache no.  Tingling/numbness no.  Weakness no.     PSYCHOLOGY:  Depression no.  Anxiety no.  Nervousness no.  Sleep disturbances no.  Suicidal ideation constant thoughts.     ENDOCRINOLOGY:  Excessive thirst no.  Excessive urination no.  Hair loss no.  Heat or cold intolerance no.     HEMATOLOGY/LYMPH:  Abnormal bleeding no.  Easy bruising no.  Swollen glands no.     DERMATOLOGY:  New/changing skin lesion no.  Rash  no.  Sores no.

## 2022-11-16 NOTE — Progress Notes (Unsigned)
eason for Appointment   1.  Preop History of Present Illness    General:  52 y/o presents for preop visit. Pt is schedule for a hysteroscopy D&C with hydrothermal ablation on 11/17/2022 for the management fo rmenorrhagia and fibroids.  --- IN REVEW: She has history of stroke in August of 2022.  She changes a overnight maxi pad and changes every 2 hours on her heaviest day.  History of heavy painful menses. Reports bleeding up to 10-14 days with changing pads 3-5 x a day.  Prior hx of thickened endometrium, heavy menses, large fibroids and negative EMB evaluated in 12-22-2014 by Dr. Richardson Dopp. Pt did not proceed with surgical option due to expense. HX of CVA December 21, 2020 --- GYN ULTRASOUND FINDINGS on 07/28/2022:  Uterus 12.63x7.36x8.39cm/ volume 408.36ml Uterine fibroids 1) anterior fundal 3.1x3.0x2.9cm, 2) Fundal 2.3x2.1x2.0cm. Fiboids increased in size from previous US 01/30/2015 Endometrium thickened. Endo 1.05cm Rt OV wnl Lt OV wnl; follice 1.3cm no adnexal masses seen. Small amt of ff seen in cds.  Current Medications Taking Ferrous Gluconate 324 (38 Fe) MG Tablet 1 tablet Orally twice a day Tylenol(Acetaminophen) 325 MG Tablet 1 tablet as needed Orally every 4 hrs Aspirin 81(Aspirin) 81 MG Tablet Delayed Release 1 tablet Orally Once a day Atorvastatin Calcium 80 MG Tablet 1 tablet Orally Once a day Medication List reviewed and reconciled with the patient Past Medical History Obesity. Anemia. Hypercholesterolemia. Stroke December 21, 2020. Surgical History cholecystectomy 09/2019 ?carotid embolization 02/2021 lumpectomy, left breast 12/2021 Family History Father: deceased, cancer prostate, restless leg syndrome Mother: deceased, breast cancer at age 83, diagnosed with Breast cancer Brother 1: alive 3 yrs, A + W Spouse: deceased, 12-22-19 - major stroke 1 brother(s) - healthy. Social History    General:  Tobacco use  cigarettes:  Never smoked, Tobacco history last updated  11/05/2022, Vaping  No. EXPOSURE TO  PASSIVE SMOKE: no. Alcohol: yes, occasionally. Caffeine: no. Recreational drug use: no. Exercise: yes, walking, yoga, barre. Marital Status: widowed. Children: 1 step son. 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 Tobacco Exposure: Husband smokes. Gyn History Sexual activity not currently sexually active.  Periods : regular, last long/heavy.  LMP 10/14/2022.  Denies H/O Birth control.  Last pap smear date 01/2015 - Negative.  Last mammogram date 08/2021 - left breast mass, benign - left breast lumpectomy 12/2021.  Denies H/O Abnormal pap smear.  Denies H/O STD.  Menarche 14.  OB History Never been pregnant  per patient.  Allergies N.K.D.A. Hospitalization/Major Diagnostic Procedure stroke 02/2021 above surgeries Review of Systems    CONSTITUTIONAL:  Chills No.  Fatigue No.  Fever No.  Night sweats No.  Recent travel outside [name] No.  Sweats No.  Weight change No.     OPHTHALMOLOGY:  Blurring of vision no.  Change in vision no.  Double vision no.     ENT:  Dizziness no.  Nose bleeds no.  Sore throat no.  Teeth pain no.     ALLERGY:  Hives no.     CARDIOLOGY:  Chest pain no.  High blood pressure no.  Irregular heart beat no.  Leg edema no.  Palpitations no.     RESPIRATORY:  Shortness of breath no.  Cough no.  Wheezing no.     UROLOGY:  Pain with urination no.  Urinary urgency no.  Urinary frequency no.  Urinary incontinence no.  Difficulty urinating No.  Blood in urine No.     GASTROENTEROLOGY:  Abdominal pain no.  Appetite change no.  Bloating/belching no.  Blood in stool or on toilet paper no.  Change in bowel movements no.  Constipation no.  Diarrhea no.  Difficulty swallowing no.  Nausea no.     FEMALE REPRODUCTIVE:  Vulvar pain no.  Vulvar rash no.  Abnormal vaginal bleeding , heavy menses.  Breast pain no.  Nipple discharge no.  Pain with intercourse no.  Pelvic pain , worse with menstruation.  Unusual vaginal discharge no.  Vaginal itching no.     MUSCULOSKELETAL:   Muscle aches no.     NEUROLOGY:  Headache no.  Tingling/numbness no.  Weakness no.     PSYCHOLOGY:  Depression no.  Anxiety no.  Nervousness no.  Sleep disturbances no.  Suicidal ideation constant thoughts.     ENDOCRINOLOGY:  Excessive thirst no.  Excessive urination no.  Hair loss no.  Heat or cold intolerance no.     HEMATOLOGY/LYMPH:  Abnormal bleeding no.  Easy bruising no.  Swollen glands no.     DERMATOLOGY:  New/changing skin lesion no.  Rash no.  Sores no.  Vital Signs Wt: 267.0, Wt change: 0.6 lbs, Ht: 60, BMI: 52.14, Pulse sitting: 76, BP sitting: 142/90. Examination    General Examination: CONSTITUTIONAL:  alert, oriented, NAD.  SKIN:  moist, warm.  EYES:  Conjunctiva clear.  LUNGS:  good I:E efffort noted, clear to auscultation bilaterally.  HEART:  regular rate and rhythm.  ABDOMEN:  soft, non-tender/non-distended, bowel sounds present.  FEMALE GENITOURINARY:  normal external genitalia, labia - unremarkable, vagina - pink moist mucosa, no lesions or abnormal discharge, cervix - no discharge or lesions or CMT, adnexa - no masses or tenderness, uterus - nontender and 12-14 week size on palpation.  EXTREMITIES:  no edema present.  PSYCH:  affect normal, good eye contact.  Physical Examination    Chaperone present:  Chaperone present  Carolin Coy 11/05/2022 04:27:42 PM > ,for pelvic exam, .  Assessments 1. Menorrhagia with irregular cycle - N92.1 (Primary) 2. Fibroids - D25.9 3. Thickened endometrium - R93.89 Treatment 1. Menorrhagia with irregular cycle        Notes: Pt is scheduled for hysteroscopy d/c with hydrothermal ablation for management of menorrhagia with an irregular cycle. She is advised HTA is 93% effective at reducing menses for up to 5 years. It fails in 7 % of patients. Pt is advised she will be able to return home the same day. Discussed risks of hysteroscopy including but not limited to infection, bleeding, possible perforation of the uterus, with  the need for further surgery. Pt advised to avoid NSAIDs ( Aleve, Advil, Ibuprofen, Motrin) from now until surgery given risk of bleeding during surgery. She may take Tylenol for pain management. She is advised to avoid eating or drinking starting midnight prior to surgery. Pt is advised that she may have watery discharge or cramping after surgery. Discussed post-surgery avoidance of driving for 24 hours or intercourse for 2 weeks after procedure.     2. Fibroids        Notes: Pt is scheduled for hysteroscopy d/c with hydrothermal ablation for management of menorrhagia with an irregular cycle. She is advised HTA is 93% effective at reducing menses for up to 5 years. It fails in 7 % of patients. Pt is advised she will be able to return home the same day. Discussed risks of hysteroscopy including but not limited to infection, bleeding, possible perforation of the uterus, with the need for further surgery. Pt advised to avoid NSAIDs ( Aleve, Advil, Ibuprofen, Motrin) from  now until surgery given risk of bleeding during surgery. She may take Tylenol for pain management. She is advised to avoid eating or drinking starting midnight prior to surgery. Pt is advised that she may have watery discharge or cramping after surgery. Discussed post-surgery avoidance of driving for 24 hours or intercourse for 2 weeks after procedure.   3. Thickened endometrium        Clinical Notes: Pt is scheduled for hysteroscopy d/c with hydrothermal ablation for management of menorrhagia with an irregular cycle. She is advised HTA is 93% effective at reducing menses for up to 5 years. It fails in 7 % of patients. Pt is advised she will be able to return home the same day. Discussed risks of hysteroscopy including but not limited to infection, bleeding, possible perforation of the uterus, with the need for further surgery. Pt advised to avoid NSAIDs ( Aleve, Advil, Ibuprofen, Motrin) from now until surgery given risk of bleeding during  surgery. She may take Tylenol for pain management. She is advised to avoid eating or drinking starting midnight prior to surgery. Pt is advised that she may have watery discharge or cramping after surgery. Discussed post-surgery avoidance of driving for 24 hours or intercourse for 2 weeks after procedure.

## 2022-11-16 NOTE — Anesthesia Preprocedure Evaluation (Signed)
Anesthesia Evaluation    Reviewed: Allergy & Precautions, Patient's Chart, lab work & pertinent test results  Airway Mallampati: I  TM Distance: >3 FB Neck ROM: Full    Dental  (+) Dental Advisory Given, Chipped,    Pulmonary neg pulmonary ROS   Pulmonary exam normal breath sounds clear to auscultation       Cardiovascular negative cardio ROS Normal cardiovascular exam Rhythm:Regular Rate:Normal     Neuro/Psych  Headaches CVA (memory problems), Residual Symptoms  negative psych ROS   GI/Hepatic negative GI ROS, Neg liver ROS,,,  Endo/Other    Morbid obesity (BMI 56)  Renal/GU negative Renal ROS  negative genitourinary   Musculoskeletal negative musculoskeletal ROS (+)    Abdominal   Peds  Hematology  (+) Blood dyscrasia, anemia   Anesthesia Other Findings History of left MCA stroke treated with thrombectomy August 2022.  Echo showed EF 55 to 60%, no thrombus, no intra-atrial shunt, normal valves.  Cardiac event monitor 06/2021 showed sinus rhythm with average heart rate 78. No SVT, no A. fib, no VT, no long pauses 3 seconds or more noted.  Reproductive/Obstetrics                             Anesthesia Physical Anesthesia Plan  ASA: 3  Anesthesia Plan: General   Post-op Pain Management: Tylenol PO (pre-op)*   Induction: Intravenous  PONV Risk Score and Plan: 3 and Ondansetron, Dexamethasone and Midazolam  Airway Management Planned: LMA  Additional Equipment: None  Intra-op Plan:   Post-operative Plan: Extubation in OR  Informed Consent: I have reviewed the patients History and Physical, chart, labs and discussed the procedure including the risks, benefits and alternatives for the proposed anesthesia with the patient or authorized representative who has indicated his/her understanding and acceptance.     Dental advisory given  Plan Discussed with: CRNA and  Anesthesiologist  Anesthesia Plan Comments: ( )        Anesthesia Quick Evaluation

## 2022-11-16 NOTE — H&P (Deleted)
  The note originally documented on this encounter has been moved the the encounter in which it belongs.  

## 2022-11-16 NOTE — H&P (Signed)
Reason for Appointment   1.  Preop History of Present Illness    General:  52 y/o presents for preop visit. Pt is schedule for a hysteroscopy D&C with hydrothermal ablation on 11/17/2022 for the management fo rmenorrhagia and fibroids.  --- IN REVEW: She has history of stroke in August of 2022.  She changes a overnight maxi pad and changes every 2 hours on her heaviest day.  History of heavy painful menses. Reports bleeding up to 10-14 days with changing pads 3-5 x a day.  Prior hx of thickened endometrium, heavy menses, large fibroids and negative EMB evaluated in 2016 by Dr. Meshell Abdulaziz. Pt did not proceed with surgical option due to expense. HX of CVA 2022 --- GYN ULTRASOUND FINDINGS on 07/28/2022:  Uterus 12.63x7.36x8.39cm/ volume 408.36ml Uterine fibroids 1) anterior fundal 3.1x3.0x2.9cm, 2) Fundal 2.3x2.1x2.0cm. Fiboids increased in size from previous us 01/30/2015 Endometrium thickened. Endo 1.05cm Rt OV wnl Lt OV wnl; follice 1.3cm no adnexal masses seen. Small amt of ff seen in cds.  Current Medications Taking Ferrous Gluconate 324 (38 Fe) MG Tablet 1 tablet Orally twice a day Tylenol(Acetaminophen) 325 MG Tablet 1 tablet as needed Orally every 4 hrs Aspirin 81(Aspirin) 81 MG Tablet Delayed Release 1 tablet Orally Once a day Atorvastatin Calcium 80 MG Tablet 1 tablet Orally Once a day Medication List reviewed and reconciled with the patient Past Medical History Obesity. Anemia. Hypercholesterolemia. Stroke 2022. Surgical History cholecystectomy 09/2019 ?carotid embolization 02/2021 lumpectomy, left breast 12/2021 Family History Father: deceased, cancer prostate, restless leg syndrome Mother: deceased, breast cancer at age 48, diagnosed with Breast cancer Brother 1: alive 34 yrs, A + W Spouse: deceased, 2021 - major stroke 1 brother(s) - healthy. Social History    General:  Tobacco use  cigarettes:  Never smoked, Tobacco history last updated  11/05/2022, Vaping  No. EXPOSURE TO  PASSIVE SMOKE: no. Alcohol: yes, occasionally. Caffeine: no. Recreational drug use: no. Exercise: yes, walking, yoga, barre. Marital Status: widowed. Children: 1 step son. 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 Tobacco Exposure: Husband smokes. Gyn History Sexual activity not currently sexually active.  Periods : regular, last long/heavy.  LMP 10/14/2022.  Denies H/O Birth control.  Last pap smear date 01/2015 - Negative.  Last mammogram date 08/2021 - left breast mass, benign - left breast lumpectomy 12/2021.  Denies H/O Abnormal pap smear.  Denies H/O STD.  Menarche 14.  OB History Never been pregnant  per patient.  Allergies N.K.D.A. Hospitalization/Major Diagnostic Procedure stroke 02/2021 above surgeries Review of Systems    CONSTITUTIONAL:  Chills No.  Fatigue No.  Fever No.  Night sweats No.  Recent travel outside [place] No.  Sweats No.  Weight change No.     OPHTHALMOLOGY:  Blurring of vision no.  Change in vision no.  Double vision no.     ENT:  Dizziness no.  Nose bleeds no.  Sore throat no.  Teeth pain no.     ALLERGY:  Hives no.     CARDIOLOGY:  Chest pain no.  High blood pressure no.  Irregular heart beat no.  Leg edema no.  Palpitations no.     RESPIRATORY:  Shortness of breath no.  Cough no.  Wheezing no.     UROLOGY:  Pain with urination no.  Urinary urgency no.  Urinary frequency no.  Urinary incontinence no.  Difficulty urinating No.  Blood in urine No.     GASTROENTEROLOGY:  Abdominal pain no.  Appetite change no.  Bloating/belching no.    Blood in stool or on toilet paper no.  Change in bowel movements no.  Constipation no.  Diarrhea no.  Difficulty swallowing no.  Nausea no.     FEMALE REPRODUCTIVE:  Vulvar pain no.  Vulvar rash no.  Abnormal vaginal bleeding , heavy menses.  Breast pain no.  Nipple discharge no.  Pain with intercourse no.  Pelvic pain , worse with menstruation.  Unusual vaginal discharge no.  Vaginal itching no.     MUSCULOSKELETAL:   Muscle aches no.     NEUROLOGY:  Headache no.  Tingling/numbness no.  Weakness no.     PSYCHOLOGY:  Depression no.  Anxiety no.  Nervousness no.  Sleep disturbances no.  Suicidal ideation constant thoughts.     ENDOCRINOLOGY:  Excessive thirst no.  Excessive urination no.  Hair loss no.  Heat or cold intolerance no.     HEMATOLOGY/LYMPH:  Abnormal bleeding no.  Easy bruising no.  Swollen glands no.     DERMATOLOGY:  New/changing skin lesion no.  Rash no.  Sores no.  Vital Signs Wt: 267.0, Wt change: 0.6 lbs, Ht: 60, BMI: 52.14, Pulse sitting: 76, BP sitting: 142/90. Examination    General Examination: CONSTITUTIONAL:  alert, oriented, NAD.  SKIN:  moist, warm.  EYES:  Conjunctiva clear.  LUNGS:  good I:E efffort noted, clear to auscultation bilaterally.  HEART:  regular rate and rhythm.  ABDOMEN:  soft, non-tender/non-distended, bowel sounds present.  FEMALE GENITOURINARY:  normal external genitalia, labia - unremarkable, vagina - pink moist mucosa, no lesions or abnormal discharge, cervix - no discharge or lesions or CMT, adnexa - no masses or tenderness, uterus - nontender and 12-14 week size on palpation.  EXTREMITIES:  no edema present.  PSYCH:  affect normal, good eye contact.  Physical Examination    Chaperone present:  Chaperone present  Lazides, Amanda 11/05/2022 04:27:42 PM > ,for pelvic exam, .  Assessments 1. Menorrhagia with irregular cycle - N92.1 (Primary) 2. Fibroids - D25.9 3. Thickened endometrium - R93.89 Treatment 1. Menorrhagia with irregular cycle        Notes: Pt is scheduled for hysteroscopy d/c with hydrothermal ablation for management of menorrhagia with an irregular cycle. She is advised HTA is 93% effective at reducing menses for up to 5 years. It fails in 7 % of patients. Pt is advised she will be able to return home the same day. Discussed risks of hysteroscopy including but not limited to infection, bleeding, possible perforation of the uterus, with  the need for further surgery. Pt advised to avoid NSAIDs ( Aleve, Advil, Ibuprofen, Motrin) from now until surgery given risk of bleeding during surgery. She may take Tylenol for pain management. She is advised to avoid eating or drinking starting midnight prior to surgery. Pt is advised that she may have watery discharge or cramping after surgery. Discussed post-surgery avoidance of driving for 24 hours or intercourse for 2 weeks after procedure.     2. Fibroids        Notes: Pt is scheduled for hysteroscopy d/c with hydrothermal ablation for management of menorrhagia with an irregular cycle. She is advised HTA is 93% effective at reducing menses for up to 5 years. It fails in 7 % of patients. Pt is advised she will be able to return home the same day. Discussed risks of hysteroscopy including but not limited to infection, bleeding, possible perforation of the uterus, with the need for further surgery. Pt advised to avoid NSAIDs ( Aleve, Advil, Ibuprofen, Motrin) from   now until surgery given risk of bleeding during surgery. She may take Tylenol for pain management. She is advised to avoid eating or drinking starting midnight prior to surgery. Pt is advised that she may have watery discharge or cramping after surgery. Discussed post-surgery avoidance of driving for 24 hours or intercourse for 2 weeks after procedure.   3. Thickened endometrium        Clinical Notes: Pt is scheduled for hysteroscopy d/c with hydrothermal ablation for management of menorrhagia with an irregular cycle. She is advised HTA is 93% effective at reducing menses for up to 5 years. It fails in 7 % of patients. Pt is advised she will be able to return home the same day. Discussed risks of hysteroscopy including but not limited to infection, bleeding, possible perforation of the uterus, with the need for further surgery. Pt advised to avoid NSAIDs ( Aleve, Advil, Ibuprofen, Motrin) from now until surgery given risk of bleeding during  surgery. She may take Tylenol for pain management. She is advised to avoid eating or drinking starting midnight prior to surgery. Pt is advised that she may have watery discharge or cramping after surgery. Discussed post-surgery avoidance of driving for 24 hours or intercourse for 2 weeks after procedure.    

## 2022-11-17 ENCOUNTER — Ambulatory Visit (HOSPITAL_COMMUNITY)
Admission: RE | Admit: 2022-11-17 | Discharge: 2022-11-17 | Disposition: A | Discharge status: Home / Self Care | Payer: No Typology Code available for payment source | Attending: Obstetrics and Gynecology | Admitting: Obstetrics and Gynecology

## 2022-11-17 ENCOUNTER — Other Ambulatory Visit: Payer: Self-pay

## 2022-11-17 ENCOUNTER — Ambulatory Visit (HOSPITAL_COMMUNITY): Payer: No Typology Code available for payment source

## 2022-11-17 ENCOUNTER — Ambulatory Visit (HOSPITAL_BASED_OUTPATIENT_CLINIC_OR_DEPARTMENT_OTHER): Payer: No Typology Code available for payment source

## 2022-11-17 ENCOUNTER — Encounter (HOSPITAL_COMMUNITY): Payer: Self-pay | Admitting: Obstetrics and Gynecology

## 2022-11-17 ENCOUNTER — Encounter (HOSPITAL_COMMUNITY)
Admission: RE | Disposition: A | Discharge status: Home / Self Care | Payer: Self-pay | Source: Home / Self Care | Attending: Obstetrics and Gynecology

## 2022-11-17 DIAGNOSIS — D259 Leiomyoma of uterus, unspecified: Secondary | ICD-10-CM | POA: Insufficient documentation

## 2022-11-17 DIAGNOSIS — D630 Anemia in neoplastic disease: Secondary | ICD-10-CM

## 2022-11-17 DIAGNOSIS — N920 Excessive and frequent menstruation with regular cycle: Secondary | ICD-10-CM | POA: Diagnosis present

## 2022-11-17 DIAGNOSIS — N921 Excessive and frequent menstruation with irregular cycle: Secondary | ICD-10-CM | POA: Insufficient documentation

## 2022-11-17 DIAGNOSIS — Z8673 Personal history of transient ischemic attack (TIA), and cerebral infarction without residual deficits: Secondary | ICD-10-CM | POA: Insufficient documentation

## 2022-11-17 DIAGNOSIS — Z6841 Body Mass Index (BMI) 40.0 and over, adult: Secondary | ICD-10-CM

## 2022-11-17 DIAGNOSIS — R9389 Abnormal findings on diagnostic imaging of other specified body structures: Secondary | ICD-10-CM | POA: Diagnosis not present

## 2022-11-17 HISTORY — PX: DILITATION & CURRETTAGE/HYSTROSCOPY WITH HYDROTHERMAL ABLATION: SHX5570

## 2022-11-17 LAB — CBC
HCT: 40.6 % (ref 36.0–46.0)
Hemoglobin: 12.3 g/dL (ref 12.0–15.0)
MCH: 23.1 pg — ABNORMAL LOW (ref 26.0–34.0)
MCHC: 30.3 g/dL (ref 30.0–36.0)
MCV: 76.2 fL — ABNORMAL LOW (ref 80.0–100.0)
Platelets: 246 10*3/uL (ref 150–400)
RBC: 5.33 MIL/uL — ABNORMAL HIGH (ref 3.87–5.11)
RDW: 16.7 % — ABNORMAL HIGH (ref 11.5–15.5)
WBC: 10.7 10*3/uL — ABNORMAL HIGH (ref 4.0–10.5)
nRBC: 0 % (ref 0.0–0.2)

## 2022-11-17 LAB — POCT PREGNANCY, URINE: Preg Test, Ur: NEGATIVE

## 2022-11-17 SURGERY — DILATATION & CURETTAGE/HYSTEROSCOPY WITH HYDROTHERMAL ABLATION
Anesthesia: General

## 2022-11-17 MED ORDER — OXYCODONE HCL 5 MG PO TABS: 5.0000 mg | ORAL_TABLET | Freq: Once | ORAL | Status: DC | PRN

## 2022-11-17 MED ORDER — BUPIVACAINE HCL (PF) 0.25 % IJ SOLN
INTRAMUSCULAR | Status: DC | PRN
  Administered 2022-11-17: 20 mL

## 2022-11-17 MED ORDER — PHENYLEPHRINE 80 MCG/ML (10ML) SYRINGE FOR IV PUSH (FOR BLOOD PRESSURE SUPPORT)
PREFILLED_SYRINGE | INTRAVENOUS | Status: AC
  Filled 2022-11-17: qty 10

## 2022-11-17 MED ORDER — CHLORHEXIDINE GLUCONATE 0.12 % MT SOLN
OROMUCOSAL | Status: AC
  Filled 2022-11-17: qty 15

## 2022-11-17 MED ORDER — KETOROLAC TROMETHAMINE 15 MG/ML IJ SOLN
INTRAMUSCULAR | Status: DC | PRN
  Administered 2022-11-17: 15 mg via INTRAVENOUS

## 2022-11-17 MED ORDER — ONDANSETRON HCL 4 MG/2ML IJ SOLN
INTRAMUSCULAR | Status: DC | PRN
  Administered 2022-11-17: 4 mg via INTRAVENOUS

## 2022-11-17 MED ORDER — MEPERIDINE HCL 25 MG/ML IJ SOLN: 6.2500 mg | INTRAMUSCULAR | Status: DC | PRN

## 2022-11-17 MED ORDER — SILVER NITRATE-POT NITRATE 75-25 % EX MISC
CUTANEOUS | Status: AC
  Filled 2022-11-17: qty 10

## 2022-11-17 MED ORDER — PROPOFOL 10 MG/ML IV BOLUS
INTRAVENOUS | Status: AC
  Filled 2022-11-17: qty 20

## 2022-11-17 MED ORDER — ACETAMINOPHEN 325 MG PO TABS: 325.0000 mg | ORAL_TABLET | ORAL | Status: DC | PRN

## 2022-11-17 MED ORDER — CHLORHEXIDINE GLUCONATE 0.12 % MT SOLN: 15.0000 mL | Freq: Once | OROMUCOSAL | Status: DC

## 2022-11-17 MED ORDER — POVIDONE-IODINE 10 % EX SWAB
2.0000 | Freq: Once | CUTANEOUS | Status: AC
  Administered 2022-11-17: 2 via TOPICAL

## 2022-11-17 MED ORDER — LACTATED RINGERS IV SOLN: INTRAVENOUS | Status: DC

## 2022-11-17 MED ORDER — PHENYLEPHRINE 80 MCG/ML (10ML) SYRINGE FOR IV PUSH (FOR BLOOD PRESSURE SUPPORT)
PREFILLED_SYRINGE | INTRAVENOUS | Status: DC | PRN
  Administered 2022-11-17: 160 ug via INTRAVENOUS
  Administered 2022-11-17: 80 ug via INTRAVENOUS

## 2022-11-17 MED ORDER — ACETAMINOPHEN 500 MG PO TABS
1000.0000 mg | ORAL_TABLET | Freq: Once | ORAL | Status: AC
  Administered 2022-11-17: 1000 mg via ORAL
  Filled 2022-11-17: qty 2

## 2022-11-17 MED ORDER — EPHEDRINE 5 MG/ML INJ
INTRAVENOUS | Status: AC
  Filled 2022-11-17: qty 5

## 2022-11-17 MED ORDER — KETOROLAC TROMETHAMINE 30 MG/ML IJ SOLN
INTRAMUSCULAR | Status: AC
  Filled 2022-11-17: qty 1

## 2022-11-17 MED ORDER — MIDAZOLAM HCL 2 MG/2ML IJ SOLN
INTRAMUSCULAR | Status: AC
  Filled 2022-11-17: qty 2

## 2022-11-17 MED ORDER — ORAL CARE MOUTH RINSE: 15.0000 mL | Freq: Once | OROMUCOSAL | Status: DC

## 2022-11-17 MED ORDER — OXYCODONE HCL 5 MG/5ML PO SOLN: 5.0000 mg | Freq: Once | ORAL | Status: DC | PRN

## 2022-11-17 MED ORDER — ONDANSETRON HCL 4 MG/2ML IJ SOLN: 4.0000 mg | Freq: Once | INTRAMUSCULAR | Status: DC | PRN

## 2022-11-17 MED ORDER — ACETAMINOPHEN 160 MG/5ML PO SOLN: 325.0000 mg | ORAL | Status: DC | PRN

## 2022-11-17 MED ORDER — STERILE WATER FOR IRRIGATION IR SOLN
Status: DC | PRN
  Administered 2022-11-17: 1000 mL

## 2022-11-17 MED ORDER — MIDAZOLAM HCL 2 MG/2ML IJ SOLN
INTRAMUSCULAR | Status: DC | PRN
  Administered 2022-11-17: 2 mg via INTRAVENOUS

## 2022-11-17 MED ORDER — FENTANYL CITRATE (PF) 250 MCG/5ML IJ SOLN
INTRAMUSCULAR | Status: DC | PRN
  Administered 2022-11-17 (×2): 50 ug via INTRAVENOUS

## 2022-11-17 MED ORDER — 0.9 % SODIUM CHLORIDE (POUR BTL) OPTIME
TOPICAL | Status: DC | PRN
  Administered 2022-11-17: 1000 mL

## 2022-11-17 MED ORDER — LIDOCAINE 2% (20 MG/ML) 5 ML SYRINGE
INTRAMUSCULAR | Status: DC | PRN
  Administered 2022-11-17: 100 mg via INTRAVENOUS

## 2022-11-17 MED ORDER — CELECOXIB 200 MG PO CAPS
200.0000 mg | ORAL_CAPSULE | Freq: Once | ORAL | Status: AC
  Administered 2022-11-17: 200 mg via ORAL
  Filled 2022-11-17: qty 1

## 2022-11-17 MED ORDER — SODIUM CHLORIDE 0.9 % IR SOLN
Status: DC | PRN
  Administered 2022-11-17: 3000 mL

## 2022-11-17 MED ORDER — OXYCODONE HCL 5 MG PO TABS
5.0000 mg | ORAL_TABLET | Freq: Four times a day (QID) | ORAL | 0 refills | Status: DC | PRN
Start: 2022-11-17 — End: 2022-12-27

## 2022-11-17 MED ORDER — DEXAMETHASONE SODIUM PHOSPHATE 10 MG/ML IJ SOLN
INTRAMUSCULAR | Status: DC | PRN
  Administered 2022-11-17: 10 mg via INTRAVENOUS

## 2022-11-17 MED ORDER — DEXAMETHASONE SODIUM PHOSPHATE 10 MG/ML IJ SOLN
INTRAMUSCULAR | Status: AC
  Filled 2022-11-17: qty 1

## 2022-11-17 MED ORDER — ACETAMINOPHEN 500 MG PO TABS
1000.0000 mg | ORAL_TABLET | ORAL | Status: DC
Start: 2022-11-17 — End: 2022-11-17

## 2022-11-17 MED ORDER — BUPIVACAINE HCL (PF) 0.25 % IJ SOLN
INTRAMUSCULAR | Status: AC
  Filled 2022-11-17: qty 30

## 2022-11-17 MED ORDER — SUCCINYLCHOLINE CHLORIDE 200 MG/10ML IV SOSY
PREFILLED_SYRINGE | INTRAVENOUS | Status: AC
  Filled 2022-11-17: qty 10

## 2022-11-17 MED ORDER — IBUPROFEN 800 MG PO TABS: 800.0000 mg | ORAL_TABLET | Freq: Three times a day (TID) | ORAL | 0 refills | Status: DC | PRN

## 2022-11-17 MED ORDER — FENTANYL CITRATE (PF) 100 MCG/2ML IJ SOLN: 25.0000 ug | INTRAMUSCULAR | Status: DC | PRN

## 2022-11-17 MED ORDER — PROPOFOL 10 MG/ML IV BOLUS
INTRAVENOUS | Status: DC | PRN
  Administered 2022-11-17: 150 mg via INTRAVENOUS

## 2022-11-17 MED ORDER — GLYCOPYRROLATE PF 0.2 MG/ML IJ SOSY
PREFILLED_SYRINGE | INTRAMUSCULAR | Status: AC
  Filled 2022-11-17: qty 1

## 2022-11-17 MED ORDER — SODIUM CHLORIDE (PF) 0.9 % IJ SOLN
INTRAMUSCULAR | Status: AC
  Filled 2022-11-17: qty 10

## 2022-11-17 MED ORDER — FENTANYL CITRATE (PF) 250 MCG/5ML IJ SOLN
INTRAMUSCULAR | Status: AC
  Filled 2022-11-17: qty 5

## 2022-11-17 MED ORDER — ONDANSETRON HCL 4 MG/2ML IJ SOLN
INTRAMUSCULAR | Status: AC
  Filled 2022-11-17: qty 2

## 2022-11-17 MED ORDER — LIDOCAINE 2% (20 MG/ML) 5 ML SYRINGE
INTRAMUSCULAR | Status: AC
  Filled 2022-11-17: qty 5

## 2022-11-17 SURGICAL SUPPLY — 14 items
CANISTER SUCT 3000ML PPV (MISCELLANEOUS) ×2 IMPLANT
CATH ROBINSON RED A/P 16FR (CATHETERS) ×2 IMPLANT
ELECT REM PT RETURN 9FT ADLT (ELECTROSURGICAL)
ELECTRODE REM PT RTRN 9FT ADLT (ELECTROSURGICAL) IMPLANT
GLOVE BIOGEL PI IND STRL 6.5 (GLOVE) ×2 IMPLANT
GLOVE SURG ENC TEXT LTX SZ6.5 (GLOVE) ×4 IMPLANT
GLOVE SURG UNDER POLY LF SZ7 (GLOVE) ×2 IMPLANT
GOWN STRL REUS W/ TWL LRG LVL3 (GOWN DISPOSABLE) ×4 IMPLANT
GOWN STRL REUS W/TWL LRG LVL3 (GOWN DISPOSABLE) ×2
PACK VAGINAL MINOR WOMEN LF (CUSTOM PROCEDURE TRAY) ×2 IMPLANT
PAD OB MATERNITY 4.3X12.25 (PERSONAL CARE ITEMS) ×2 IMPLANT
SET GENESYS HTA PROCERVA (MISCELLANEOUS) IMPLANT
TOWEL GREEN STERILE FF (TOWEL DISPOSABLE) ×4 IMPLANT
UNDERPAD 30X36 HEAVY ABSORB (UNDERPADS AND DIAPERS) ×2 IMPLANT

## 2022-11-17 NOTE — Anesthesia Postprocedure Evaluation (Signed)
Anesthesia Post Note  Patient: Renee Hahn  Procedure(s) Performed: DILATATION & CURETTAGE/HYSTEROSCOPY WITH HYDROTHERMAL ABLATION     Patient location during evaluation: PACU Anesthesia Type: General Level of consciousness: awake and alert Pain management: pain level controlled Vital Signs Assessment: post-procedure vital signs reviewed and stable Respiratory status: spontaneous breathing, nonlabored ventilation, respiratory function stable and patient connected to nasal cannula oxygen Cardiovascular status: blood pressure returned to baseline and stable Postop Assessment: no apparent nausea or vomiting Anesthetic complications: no   No notable events documented.  Last Vitals:  Vitals:   11/17/22 0948 11/17/22 1003  BP: 121/79 108/76  Pulse: 81 (!) 54  Resp: 17 16  Temp:  36.7 C  SpO2: 100% 100%    Last Pain:  Vitals:   11/17/22 1003  TempSrc:   PainSc: 0-No pain                 [employer]

## 2022-11-17 NOTE — Op Note (Signed)
11/17/2022  9:29 AM  PATIENT:  Renee Hahn  52 y.o. female  PRE-OPERATIVE DIAGNOSIS:  Fibroids Menorrhagia with Irregular Cycles Thickend Endometrium  POST-OPERATIVE DIAGNOSIS:  FibroidsMenorrhagia with Irregular CyclesThickend Endometrium  PROCEDURE:  Procedure(s): DILATATION & CURETTAGE/HYSTEROSCOPY WITH HYDROTHERMAL ABLATION (N/A)  SURGEON:  Surgeon(s) and Role:    Gerald Leitz, MD - Primary  PHYSICIAN ASSISTANT: None  ASSISTANTS: none   ANESTHESIA:   general  EBL:  5 cc    BLOOD ADMINISTERED:none  DRAINS: none   LOCAL MEDICATIONS USED:  MARCAINE     SPECIMEN:  Source of Specimen:  endometrial currettings  DISPOSITION OF SPECIMEN:  PATHOLOGY  COUNTS:  YES  TOURNIQUET:  * No tourniquets in log *  DICTATION: .Note written in EPIC  PLAN OF CARE: Discharge to home after PACU  PATIENT DISPOSITION:  PACU - hemodynamically stable.   Delay start of Pharmacological VTE agent (>24hrs) due to surgical blood loss or risk of bleeding: not applicable  Findings: normal appearing external genitalia. Normal appearing cervix. Proliferative endometrium.   Procedure. The patient was taken to the operative room were a time out was performed. She was placed in the dorsal lithotomy position and prepped and draped in the normal sterile fashion. A speculum was placed in the vaginal vault. The anterior lip of the cervix was grasped with a single tooth tenaculum. The uterus was sounded to 8 cm. The cervix was dilated to 8 mm. The hydrothermal hysteroscope was inserted.  The ostia were visualized bilaterally. Endometrial curettings were obtained with a sharp curette. The hysteroscope was reinserted. There was no evidence of perforation. The hydrothermal ablation was performed for 10 minutes.  The hysteroscope was removed. 10 cc of 25% marcaine was injected at the 4 and 8 oclock position.  The single tooth tenaculum was removed.  Excellent hemostasis was noted.   Sponge lap and needle  counts were correct x 2.  The patient was awakened from anesthesia and taken to the recovery room in stable condition.

## 2022-11-17 NOTE — Anesthesia Procedure Notes (Signed)
Procedure Name: LMA Insertion Date/Time: 11/17/2022 8:35 AM  Performed by: Kayleen Memos, CRNAPre-anesthesia Checklist: Patient identified, Emergency Drugs available, Suction available, Timeout performed and Patient being monitored Patient Re-evaluated:Patient Re-evaluated prior to induction Oxygen Delivery Method: Circle system utilized Preoxygenation: Pre-oxygenation with 100% oxygen Induction Type: IV induction Ventilation: Mask ventilation without difficulty LMA: LMA inserted LMA Size: 4.0 Number of attempts: 2 Tube secured with: Tape Dental Injury: Teeth and Oropharynx as per pre-operative assessment

## 2022-11-17 NOTE — H&P (Signed)
Date of Initial H&P: 11/16/2022  History reviewed, patient examined, no change in status, stable for surgery.

## 2022-11-17 NOTE — Transfer of Care (Signed)
Immediate Anesthesia Transfer of Care Note  Patient: Renee Hahn  Procedure(s) Performed: DILATATION & CURETTAGE/HYSTEROSCOPY WITH HYDROTHERMAL ABLATION  Patient Location: PACU  Anesthesia Type:General  Level of Consciousness: awake, oriented, and drowsy  Airway & Oxygen Therapy: Patient Spontanous Breathing and Patient connected to face mask oxygen  Post-op Assessment: Report given to RN, Post -op Vital signs reviewed and stable, and Patient moving all extremities  Post vital signs: stable  Last Vitals:  Vitals Value Taken Time  BP 133/79 11/17/22 0933  Temp    Pulse 87 11/17/22 0935  Resp 19 11/17/22 0936  SpO2 99 % 11/17/22 0935  Vitals shown include unvalidated device data.  Last Pain:  Vitals:   11/17/22 0756  TempSrc:   PainSc: 3          Complications: No notable events documented.

## 2022-11-18 ENCOUNTER — Encounter (HOSPITAL_COMMUNITY): Payer: Self-pay | Admitting: Obstetrics and Gynecology

## 2022-11-18 LAB — SURGICAL PATHOLOGY

## 2022-11-19 ENCOUNTER — Other Ambulatory Visit: Payer: Self-pay | Admitting: Nurse Practitioner

## 2022-11-19 ENCOUNTER — Telehealth: Payer: Self-pay | Admitting: Nurse Practitioner

## 2022-11-19 NOTE — Telephone Encounter (Signed)
Prescription Request  11/19/2022  LOV: 08/16/2022  What is the name of the medication or equipment? aspirin EC 81 MG tablet [[id number]]     Have you contacted your pharmacy to request a refill? Yes, she was told to call our office Which pharmacy would you like this sent to?  [id number] - [place], [place] - [address] AT [facility] [address] [place] [address] Phone: [phone] Fax: [phone]    Patient notified that their request is being sent to the clinical staff for review and that they should receive a response within 2 business days.   Please advise at [facility] [phone]

## 2022-11-22 ENCOUNTER — Other Ambulatory Visit: Payer: Self-pay

## 2022-11-22 MED ORDER — ASPIRIN 81 MG PO TBEC: 81.0000 mg | DELAYED_RELEASE_TABLET | Freq: Every day | ORAL | 1 refills | Status: DC

## 2022-11-22 NOTE — Telephone Encounter (Signed)
Sent!

## 2022-11-30 NOTE — Telephone Encounter (Signed)
Pt is requesting that this medaspirin EC 81 MG tablet [[id number]]   be for 90 days for 1 year. Same pharmacy as above.

## 2022-12-02 ENCOUNTER — Other Ambulatory Visit: Payer: Self-pay

## 2022-12-02 MED ORDER — ASPIRIN 81 MG PO TBEC: 81.0000 mg | DELAYED_RELEASE_TABLET | Freq: Every day | ORAL | 3 refills | Status: DC

## 2022-12-02 NOTE — Telephone Encounter (Signed)
Sent!

## 2022-12-06 ENCOUNTER — Telehealth: Payer: Self-pay | Admitting: *Deleted

## 2022-12-06 NOTE — Telephone Encounter (Signed)
Spoke with the patient to schedule a new patient appt. Patient requested an afternoon appt. Explained that the office would call hr back once the providers were asked. Explained that all new patient appts are in he morning.

## 2022-12-08 ENCOUNTER — Encounter: Payer: Self-pay | Admitting: Physician Assistant

## 2022-12-08 NOTE — Telephone Encounter (Signed)
Patient called back. Spoke with the patient regarding the referral to GYN oncology. Patient scheduled as new patient with Dr Alvester Morin on 6/24 at 2:30 pm. Patient given an arrival time of 2 pm.  Explained to the patient the the doctor will perform a pelvic exam at this visit. Patient given the policy that only one visitor allowed and that visitor must be over 16 yrs are allowed in the Cancer Center. Patient given the address/phone number for the clinic and that the center offers free valet service. Patient aware that masks are option.

## 2022-12-24 ENCOUNTER — Telehealth: Payer: Self-pay | Admitting: *Deleted

## 2022-12-24 NOTE — Telephone Encounter (Signed)
Opened in error

## 2022-12-27 ENCOUNTER — Other Ambulatory Visit: Payer: Self-pay

## 2022-12-27 ENCOUNTER — Inpatient Hospital Stay (HOSPITAL_BASED_OUTPATIENT_CLINIC_OR_DEPARTMENT_OTHER): Payer: No Typology Code available for payment source | Admitting: Gynecologic Oncology

## 2022-12-27 ENCOUNTER — Encounter: Payer: Self-pay | Admitting: Psychiatry

## 2022-12-27 ENCOUNTER — Inpatient Hospital Stay: Payer: No Typology Code available for payment source | Attending: Psychiatry | Admitting: Psychiatry

## 2022-12-27 VITALS — BP 131/75 | HR 79 | Temp 98.9°F | Resp 18 | Ht 58.47 in | Wt 271.8 lb

## 2022-12-27 DIAGNOSIS — Z803 Family history of malignant neoplasm of breast: Secondary | ICD-10-CM | POA: Insufficient documentation

## 2022-12-27 DIAGNOSIS — Z7982 Long term (current) use of aspirin: Secondary | ICD-10-CM | POA: Insufficient documentation

## 2022-12-27 DIAGNOSIS — Z6841 Body Mass Index (BMI) 40.0 and over, adult: Secondary | ICD-10-CM

## 2022-12-27 DIAGNOSIS — Z8673 Personal history of transient ischemic attack (TIA), and cerebral infarction without residual deficits: Secondary | ICD-10-CM

## 2022-12-27 DIAGNOSIS — R7303 Prediabetes: Secondary | ICD-10-CM | POA: Insufficient documentation

## 2022-12-27 DIAGNOSIS — Z79899 Other long term (current) drug therapy: Secondary | ICD-10-CM | POA: Insufficient documentation

## 2022-12-27 DIAGNOSIS — J301 Allergic rhinitis due to pollen: Secondary | ICD-10-CM | POA: Insufficient documentation

## 2022-12-27 DIAGNOSIS — N8502 Endometrial intraepithelial neoplasia [EIN]: Secondary | ICD-10-CM | POA: Diagnosis present

## 2022-12-27 DIAGNOSIS — Z8042 Family history of malignant neoplasm of prostate: Secondary | ICD-10-CM | POA: Diagnosis not present

## 2022-12-27 DIAGNOSIS — I6930 Unspecified sequelae of cerebral infarction: Secondary | ICD-10-CM

## 2022-12-27 NOTE — Progress Notes (Signed)
Patient here for a pre-operative appointment prior to her scheduled surgery on January 04, 2023. She is scheduled for a robotic assisted total laparoscopic hysterectomy, bilateral salpingo-oophorectomy, sentinel lymph node biopsy, possible dilation and curettage (D&C), possible levonorgestrel IUD placement, possible mini-laparotomy. The surgery was discussed in detail.  See after visit summary for additional details. Visual aids used to discuss items related to surgery including the incentive spirometer, sequential compression stockings, foley catheter, IV pump, multi-modal pain regimen including tylenol, photo of the surgical robot, female reproductive system to discuss surgery in detail.      Discussed post-op pain management in detail including the aspects of the enhanced recovery pathway.  Advised her that a new prescription would be sent in and it is only to be used for after her upcoming surgery.  We discussed the use of tylenol post-op and to monitor for a maximum of 4,000 mg in a 24 hour period.  Also prescribed sennakot to be used after surgery and to hold if having loose stools.  Discussed bowel regimen in detail.     Discussed the use of SCDs and measures to take at home to prevent DVT including frequent mobility.  Reportable signs and symptoms of DVT discussed. Post-operative instructions discussed and expectations for after surgery. Incisional care discussed as well including reportable signs and symptoms including erythema, drainage, wound separation.     30 minutes spent with the patient.  Verbalizing understanding of material discussed. No needs or concerns voiced at the end of the visit.   Advised patient to call for any needs.  Advised that her post-operative medications had been prescribed and could be picked up at any time.    This appointment is included in the global surgical bundle as pre-operative teaching and has no charge.

## 2022-12-27 NOTE — Patient Instructions (Signed)
Preparing for your Surgery  Plan for surgery on January 04, 2023 with Dr. Clide Cliff at [facility] OR. You will be scheduled for a robotic assisted total laparoscopic hysterectomy (removal of uterus and cervix), bilateral salpingo-oophorectomy (removal of both fallopian tubes and ovaries), sentinel lymph node biopsy, possible dilation and curettage (D&C), possible levonorgestrel IUD placement, possible mini-laparotomy.   Pre-operative Testing -You will receive a phone call from presurgical testing at [facility] to arrange for a pre-operative appointment and lab work.  -Bring your insurance card, copy of an advanced directive if applicable, medication list  -At that visit, you will be asked to sign a consent for a possible blood transfusion in case a transfusion becomes necessary during surgery.  The need for a blood transfusion is rare but having consent is a necessary part of your care.     -You should not be taking blood thinners or aspirin at least ten days prior to surgery unless instructed by your surgeon.  -Do not take supplements such as fish oil (omega 3), red yeast rice, turmeric before your surgery. You want to avoid medications with aspirin in them including headache powders such as BC or Goody's), Excedrin migraine.  Day Before Surgery at Home -You will be asked to take in a light diet the day before surgery. You will be advised you can have clear liquids up until 3 hours before your surgery.    Eat a light diet the day before surgery.  Examples including soups, broths, toast, yogurt, mashed potatoes.  AVOID GAS PRODUCING FOODS AND BEVERAGES. Things to avoid include carbonated beverages (fizzy beverages, sodas), raw fruits and raw vegetables (uncooked), or beans.   If your bowels are filled with gas, your surgeon will have difficulty visualizing your pelvic organs which increases your surgical risks.  Your role in recovery Your role is to become active as  soon as directed by your doctor, while still giving yourself time to heal.  Rest when you feel tired. You will be asked to do the following in order to speed your recovery:  - Cough and breathe deeply. This helps to clear and expand your lungs and can prevent pneumonia after surgery.  - STAY ACTIVE WHEN YOU GET HOME. Do mild physical activity. Walking or moving your legs help your circulation and body functions return to normal. Do not try to get up or walk alone the first time after surgery.   -If you develop swelling on one leg or the other, pain in the back of your leg, redness/warmth in one of your legs, please call the office or go to the Emergency Room to have a doppler to rule out a blood clot. For shortness of breath, chest pain-seek care in the Emergency Room as soon as possible. - Actively manage your pain. Managing your pain lets you move in comfort. We will ask you to rate your pain on a scale of zero to 10. It is your responsibility to tell your doctor or nurse where and how much you hurt so your pain can be treated.  Special Considerations -If you are diabetic, you may be placed on insulin after surgery to have closer control over your blood sugars to promote healing and recovery.  This does not mean that you will be discharged on insulin.  If applicable, your oral antidiabetics will be resumed when you are tolerating a solid diet.  -Your final pathology results from surgery should be available around one week after surgery and the  results will be relayed to you when available.  -Dr. Antionette Char is the surgeon that assists your GYN Oncologist with surgery.  If you end up staying the night, the next day after your surgery you will either see Dr. Pricilla Holm, Dr. Alvester Morin, or Dr. Antionette Char.  -FMLA forms can be faxed to [phone] and please allow 5-7 business days for completion.  Pain Management After Surgery -You will be prescribed your pain medication and bowel regimen  medications before surgery so that you can have these available when you are discharged from the hospital. The pain medication is for use ONLY AFTER surgery and a new prescription will not be given.   -Make sure that you have Tylenol and Ibuprofen IF YOU ARE ABLE TO TAKE THESE MEDICATIONS at home to use on a regular basis after surgery for pain control. We recommend alternating the medications every hour to six hours since they work differently and are processed in the body differently for pain relief.  -Review the attached handout on narcotic use and their risks and side effects.   Bowel Regimen -You have been prescribed Sennakot-S to take nightly to prevent constipation especially if you are taking the narcotic pain medication intermittently.  It is important to prevent constipation and drink adequate amounts of liquids. You can stop taking this medication when you are not taking pain medication and you are back on your normal bowel routine.  Risks of Surgery Risks of surgery are low but include bleeding, infection, damage to surrounding structures, re-operation, blood clots, and very rarely death.   Blood Transfusion Information (For the consent to be signed before surgery)  We will be checking your blood type before surgery so in case of emergencies, we will know what type of blood you would need.                                            WHAT IS A BLOOD TRANSFUSION?  A transfusion is the replacement of blood or some of its parts. Blood is made up of multiple cells which provide different functions. Red blood cells carry oxygen and are used for blood loss replacement. White blood cells fight against infection. Platelets control bleeding. Plasma helps clot blood. Other blood products are available for specialized needs, such as hemophilia or other clotting disorders. BEFORE THE TRANSFUSION  Who gives blood for transfusions?  You may be able to donate blood to be used at a later date on  yourself (autologous donation). Relatives can be asked to donate blood. This is generally not any safer than if you have received blood from a stranger. The same precautions are taken to ensure safety when a relative's blood is donated. Healthy volunteers who are fully evaluated to make sure their blood is safe. This is blood bank blood. Transfusion therapy is the safest it has ever been in the practice of medicine. Before blood is taken from a donor, a complete history is taken to make sure that person has no history of diseases nor engages in risky social behavior (examples are intravenous drug use or sexual activity with multiple partners). The donor's travel history is screened to minimize risk of transmitting infections, such as malaria. The donated blood is tested for signs of infectious diseases, such as HIV and hepatitis. The blood is then tested to be sure it is compatible with you in order to minimize  the chance of a transfusion reaction. If you or a relative donates blood, this is often done in anticipation of surgery and is not appropriate for emergency situations. It takes many days to process the donated blood. RISKS AND COMPLICATIONS Although transfusion therapy is very safe and saves many lives, the main dangers of transfusion include:  Getting an infectious disease. Developing a transfusion reaction. This is an allergic reaction to something in the blood you were given. Every precaution is taken to prevent this. The decision to have a blood transfusion has been considered carefully by your caregiver before blood is given. Blood is not given unless the benefits outweigh the risks.  AFTER SURGERY INSTRUCTIONS  Return to work: 6 weeks if applicable  Activity: 1. Be up and out of the bed during the day.  Take a nap if needed.  You may walk up steps but be careful and use the hand rail.  Stair climbing will tire you more than you think, you may need to stop part way and rest.   2. No  lifting or straining for 6 weeks over 10 pounds. No pushing, pulling, straining for 6 weeks.  3. No driving for 1 week(s).  Do not drive if you are taking narcotic pain medicine and make sure that your reaction time has returned.   4. You can shower as soon as the next day after surgery. Shower daily.  Use your regular soap and water (not directly on the incision) and pat your incision(s) dry afterwards; don't rub.  No tub baths or submerging your body in water until cleared by your surgeon. If you have the soap that was given to you by pre-surgical testing that was used before surgery, you do not need to use it afterwards because this can irritate your incisions.   5. No sexual activity and nothing in the vagina for 8-10 weeks.  6. You may experience a small amount of clear drainage from your incisions, which is normal.  If the drainage persists, increases, or changes color please call the office.  7. Do not use creams, lotions, or ointments such as neosporin on your incisions after surgery until advised by your surgeon because they can cause removal of the dermabond glue on your incisions.    8. You may experience vaginal spotting after surgery or around the 6-8 week mark from surgery when the stitches at the top of the vagina begin to dissolve.  The spotting is normal but if you experience heavy bleeding, call our office.  9. Take Tylenol or ibuprofen first for pain if you are able to take these medications and only use narcotic pain medication for severe pain not relieved by the Tylenol or Ibuprofen.  Monitor your Tylenol intake to a max of 4,000 mg in a 24 hour period. You can alternate these medications after surgery.  Diet: 1. Low sodium Heart Healthy Diet is recommended but you are cleared to resume your normal (before surgery) diet after your procedure.  2. It is safe to use a laxative, such as Miralax or Colace, if you have difficulty moving your bowels. You have been prescribed  Sennakot-S to take at bedtime every evening after surgery to keep bowel movements regular and to prevent constipation.    Wound Care: 1. Keep clean and dry.  Shower daily.  Reasons to call the Doctor: Fever - Oral temperature greater than 100.4 degrees Fahrenheit Foul-smelling vaginal discharge Difficulty urinating Nausea and vomiting Increased pain at the site of the incision that  is unrelieved with pain medicine. Difficulty breathing with or without chest pain New calf pain especially if only on one side Sudden, continuing increased vaginal bleeding with or without clots.   Contacts: For questions or concerns you should contact:  Dr. Clide Cliff at [phone]  Warner Mccreedy, NP at [phone]  After Hours: call [phone] and have the GYN Oncologist paged/contacted (after 5 pm or on the weekends). You will speak with an after hours RN and let he or she know you have had surgery.  Messages sent via mychart are for non-urgent matters and are not responded to after hours so for urgent needs, please call the after hours number.

## 2022-12-27 NOTE — Progress Notes (Signed)
GYNECOLOGIC ONCOLOGY NEW PATIENT CONSULTATION  Date of Service: 12/27/2022 Referring Provider: Gerald Leitz, MD   ASSESSMENT AND PLAN: Renee Hahn is a 52 y.o. woman with EIN.  We reviewed the diagnosis of endometrial intraepithelial neoplasia (EIN) and the treatment options, including medical management (Mirena IUD or progesterone PO) or hysterectomy.    We discussed that surgical management is the recommended treatment unless the patient is not a suitable surgical candidate or otherwise desiring to maintain fertility. Patient is an appropriate candidate for surgery and desires to proceed with surgical management.  We did however discuss that in some cases with central adiposity, patients may not tolerate the Trendelenburg position.  Some of her weight is in her lower extremities, so she may do okay.  We also discussed that given her fibroids and no vaginal deliveries, she may require a mini laparotomy for specimen retrieval.  But we will attempt removal through the vagina first.  The patient is a suitable candidate for hysterectomy via a minimally invasive approach to surgery.  Given that she is perimenopausal, a bilateral salpingo-oophorectomy is also recommended.  We reviewed that she may have an increase in menopausal symptoms. we reviewed that robotic assistance would be used to complete the surgery.  We discussed that endometrial cancer is detected in about 40% of final uterine pathology specimens from patients with EIN. Given this, we would recommend evaluation of lymph nodes at time of surgery with a sentinel lymph node evaluation.  We reviewed the sentinel lymph node technique. Risks and benefits of sentinel lymph node biopsy was reviewed. We reviewed the technique and ICG dye. The patient DOES NOT have an iodine allergy or known liver dysfunction. We reviewed the false negative rate (0.4%), and that 3% of patients with metastatic disease will not have it detected by SLN biopsy in  endometrial cancer. A low risk of allergic reaction to the dye, <0.2% for ICG, has been reported. We also discussed that in the case of failed mapping, which occurs 40% of the time, we would likely not perform a lymphadenectomy given only EIN on preoperative testing.  Potential benefits of sentinel nodes including a higher detection rate for metastasis due to ultrastaging and potential reduction in operative morbidity. However, there remains uncertainty as to the role for treatment of micrometastatic disease. Further, the benefit of operative morbidity associated with the SLN technique in endometrial cancer is not yet completely known. In other patient populations (e.g. the cervical cancer population) there has been observed reductions in morbidity with SLN biopsy compared to pelvic lymphadenectomy. Lymphedema, nerve dysfunction and lymphocysts are all potential risks with the SLN technique as with complete lymphadenectomy. Additional risks to the patient include the risk of damage to an internal organ while operating in an altered view (e.g. the black and white image of the robotic fluorescence imaging mode).   She has elected to proceed with hysterectomy.  We reviewed that if she were to not tolerate Trendelenburg position, I would recommend proceeding with a D&C and levonorgestrel IUD insertion for treatment of EIN. I reviewed that per the [facility] [name] Medical Eligibility Criteria, Selected Practice Recommendations, LND IUD in the setting of prior stroke is given a 2 - "advantages generally outweigh theoretical or proven risks."  Given that the advantages would be to treat a precancer and possible cancer, I discussed with the patient that the benefits of a localized hormone to prevent a cancer would outweigh the theoretical risk.  The goal would still be to use this temporarily as  patient continue to work towards optimization for safe hysterectomy.  Patient was consented for: Total robotic hysterectomy,  bilateral salpingo-oophorectomy, bilateral sentinel lymph node evaluation and biopsy, possible dilation and curettage, levonorgestrel IUD insertion, possible mini laparotomy for specimen retrieval on 01/04/23.  The risks of surgery were discussed in detail and she understands these to including but not limited to bleeding requiring a blood transfusion, infection, injury to adjacent organs (including but not limited to the bowels, bladder, ureters, nerves, blood vessels), thromboembolic events, wound separation, hernia, vaginal cuff separation, possible risk of lymphedema and lymphocyst if lymphadenectomy performed, unforseen complication, possible need for re-exploration, and medical complications such as heart attack, stroke, pneumonia.  If the patient experiences any of these events, she understands that her hospitalization or recovery may be prolonged and that she may need to take additional medications for a prolonged period. The patient will receive DVT and antibiotic prophylaxis as indicated. She voiced a clear understanding. She had the opportunity to ask questions and informed consent was obtained today. She wishes to proceed.  Her METs are >4. But given her history of stroke, will request clearance from PCP. Pt is physically active, generally asymptomatic, so I suspect she will be safe candidate for surgery. All preoperative instructions were reviewed. Postoperative expectations were also reviewed. Written handouts were provided to the patient.  A copy of this note was sent to the patient's referring provider.  Clide Cliff, MD Gynecologic Oncology   Medical Decision Making I personally spent  TOTAL 60 minutes face-to-face and non-face-to-face in the care of this patient, which includes all pre, intra, and post visit time on the date of service.  ------------  CC: EIN  HISTORY OF PRESENT ILLNESS:  Renee Hahn is a 52 y.o. woman who is seen in consultation at the request of  Gerald Leitz, MD for evaluation of EIN.  Patient presented to her OB/GYN on 09/02/2022 for consideration of surgery, possibly EMB in the setting of heavy painful menses.  She reported that time bleeding up to 10 to 14 days, changing pads 3-5 times a day.  Per records, patient has a history of thickened endometrium, heavy menses, large fibroids with a negative EMB in the past in 2016.  At that time patient declined surgical option due to expense.  An ultrasound on 07/28/2022 noted a uterus measuring 12.6 cm with few 2 to 3 cm fibroids.  Her endometrium was thickened at that time to 1 cm.  At her visit in February, she was recommended to undergo a D&C, ablation given the patient has a history of stroke, desiring to avoid hormonal therapy in this setting.  Preoperative endometrial biopsy returned with simple hyperplasia without atypia.  She then underwent a dilation and curettage, hysteroscopy with hydrothermal ablation on 11/17/2022.  D&C specimen returned with EIN.  Today, pt reports that she is not currently having vaginal bleeding. She reports some occasional pelvic cramping symptoms.  She otherwise denies abdominal bloating, early satiety, significant weight loss, change in bowel or bladder habits.  She is working on improving her overall health and is doing more walking which is helping her to lose weight.  Of note, patient has a history of stroke in 2022, treated at [facility].  She underwent a thrombectomy and has been treated with low-dose aspirin and statin therapy since.  She was graduated from neurology follow-up to continue follow-up with her PCP.  She reports that her presenting symptom was aphasia, "talking gibberish."  She has had some residual memory loss, slow  word recall since.  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    PAST MEDICAL HISTORY: Past Medical History:  Diagnosis Date   Cholelithiasis    Hay fever    Headache    Iron deficiency anemia    Prediabetes    Stroke [facility])     Vitamin B12 deficiency 02/27/2021    PAST SURGICAL HISTORY: Past Surgical History:  Procedure Laterality Date   BREAST BIOPSY     BREAST EXCISIONAL BIOPSY Left 12/03/2021   benign   BREAST LUMPECTOMY WITH RADIOACTIVE SEED LOCALIZATION Left 12/03/2021   Procedure: LEFT BREAST LUMPECTOMY WITH RADIOACTIVE SEED LOCALIZATION;  Surgeon: Harriette Bouillon, MD;  Location: [facility] OR;  Service: General;  Laterality: Left;   CHOLECYSTECTOMY N/A 09/21/2019   Procedure: LAPAROSCOPIC CHOLECYSTECTOMY WITH INTRAOPERATIVE CHOLANGIOGRAM;  Surgeon: Harriette Bouillon, MD;  Location: [facility] ORS;  Service: General;  Laterality: N/A;   DILITATION & CURRETTAGE/HYSTROSCOPY WITH HYDROTHERMAL ABLATION N/A 11/17/2022   Procedure: DILATATION & CURETTAGE/HYSTEROSCOPY WITH HYDROTHERMAL ABLATION;  Surgeon: Gerald Leitz, MD;  Location: [facility] OR;  Service: Gynecology;  Laterality: N/A;    OB/GYN HISTORY: OB History  Gravida Para Term Preterm AB Living  0 0 0 0 0 0  SAB IAB Ectopic Multiple Live Births  0 0 0 0 0      Age at menarche: 16 Age at menopause: n/a Hx of HRT: na/ Hx of STI: gonorhea Last pap: 07/2022, nml History of abnormal pap smears: denies  SCREENING STUDIES:  Last mammogram: 09/2022 Last colonoscopy: Scheduled for 7/25  MEDICATIONS:  Current Outpatient Medications:    acetaminophen (TYLENOL) 650 MG CR tablet, Take 650-1,300 mg by mouth every 8 (eight) hours as needed for pain., Disp: , Rfl:    aspirin EC 81 MG tablet, Take 1 tablet (81 mg total) by mouth daily. Swallow whole., Disp: 90 tablet, Rfl: 3   atorvastatin (LIPITOR) 80 MG tablet, TAKE 1 TABLET BY MOUTH EVERYDAY AT BEDTIME, Disp: 90 tablet, Rfl: 2   ferrous gluconate (FERGON) 324 MG tablet, Take 324 mg by mouth 2 (two) times daily with a meal., Disp: , Rfl:    ibuprofen (ADVIL) 800 MG tablet, Take 1 tablet (800 mg total) by mouth every 8 (eight) hours as needed., Disp: 30 tablet, Rfl: 0   Na Sulfate-K Sulfate-Mg Sulf 17.5-3.13-1.6 GM/177ML SOLN, As  directed by GI office for colon prep, Disp: 354 mL, Rfl: 0   oxyCODONE (OXY IR/ROXICODONE) 5 MG immediate release tablet, Take 1 tablet (5 mg total) by mouth every 6 (six) hours as needed for severe pain., Disp: 8 tablet, Rfl: 0  ALLERGIES: No Known Allergies  FAMILY HISTORY: Family History  Problem Relation Age of Onset   Breast cancer Mother    Prostate cancer Father    Hypertension Maternal Grandmother    Hypertension Paternal Grandfather    Endometrial cancer Neg Hx    Ovarian cancer Neg Hx    Colon cancer Neg Hx     SOCIAL HISTORY: Social History   Socioeconomic History   Marital status: Widowed    Spouse name: Not on file   Number of children: Not on file   Years of education: Not on file   Highest education level: Not on file  Occupational History   Not on file  Tobacco Use   Smoking status: Never   Smokeless tobacco: Never  Vaping Use   Vaping Use: Never used  Substance and Sexual Activity   Alcohol use: Yes    Comment: socially   Drug  use: No   Sexual activity: Not Currently    Partners: Male  Other Topics Concern   Not on file  Social History Narrative   Not on file   Social Determinants of Health   Financial Resource Strain: Not on file  Food Insecurity: Not on file  Transportation Needs: Not on file  Physical Activity: Not on file  Stress: Not on file  Social Connections: Not on file  Intimate Partner Violence: Not on file    REVIEW OF SYSTEMS: New patient intake form was reviewed.  Complete 10-system review is negative except for the following: none  PHYSICAL EXAM: BP 131/75 (BP Location: Left Arm, Patient Position: Sitting)   Pulse 79   Temp 98.9 F (37.2 C)   Resp 18   Ht 4' 10.47" (1.485 m)   Wt 271 lb 12.8 oz (123.3 kg)   SpO2 100%   BMI 55.91 kg/m  Constitutional: No acute distress. Neuro/Psych: Alert, oriented.  Head and Neck: Normocephalic, atraumatic. Neck symmetric without masses. Sclera anicteric.  Respiratory: Normal  work of breathing. Clear to auscultation bilaterally. Cardiovascular: Regular rate and rhythm, no murmurs, rubs, or gallops. Abdomen: Normoactive bowel sounds. Soft, non-distended, non-tender to palpation. No masses appreciated. No evidence of hernia. Well healed RUQ laparoscopic incisions. Extremities: Grossly normal range of motion. Warm, well perfused. No edema bilaterally. Skin: No rashes or lesions. Lymphatic: No cervical, supraclavicular, or inguinal adenopathy. Genitourinary: External genitalia without lesions. Urethral meatus without lesions or prolapse. On speculum exam, vagina and cervix without lesions. Bimanual exam reveals normal cervix and mildly enlarged, mobile uterus. Narrow introitus. Exam chaperoned by Andrey Cota, RN   LABORATORY AND RADIOLOGIC DATA: Outside medical records were reviewed to synthesize the above history, along with the history and physical obtained during the visit.  Outside laboratory, pathology, and imaging reports were reviewed, with pertinent results below.   WBC  Date Value Ref Range Status  11/17/2022 10.7 (H) 4.0 - 10.5 K/uL Final   Hemoglobin  Date Value Ref Range Status  11/17/2022 12.3 12.0 - 15.0 g/dL Final  09/81/1914 78.2 12.0 - 15.0 g/dL Final   HCT  Date Value Ref Range Status  11/17/2022 40.6 36.0 - 46.0 % Final   Platelets  Date Value Ref Range Status  11/17/2022 246 150 - 400 K/uL Final   Platelet Count  Date Value Ref Range Status  04/30/2020 255 150 - 400 K/uL Final   Creatinine  Date Value Ref Range Status  12/18/2019 0.82 0.44 - 1.00 mg/dL Final   Creatinine, Ser  Date Value Ref Range Status  06/14/2022 0.68 0.40 - 1.20 mg/dL Final   AST  Date Value Ref Range Status  06/14/2022 34 0 - 37 U/L Final  12/18/2019 66 (H) 15 - 41 U/L Final   ALT  Date Value Ref Range Status  06/14/2022 18 0 - 35 U/L Final  12/18/2019 15 0 - 44 U/L Final   A1c (09/13/22): 5.3  Surgical pathology (09/02/2022): Endometrial biopsy:  Simple hyperplasia without atypia.  Stromal breakdown with fibrin thrombi consistent with bleeding.  Negative for polyp, atypia and carcinoma  Surgical pathology (11/17/22): A. ENDOMETRIUM, CURETTAGE:  Complex atypical hyperplasia (EIN) with morular metaplasia  Stromal breakdown and fibrin thrombi consistent with bleeding  Negative for polyp   Pelvic ultrasound (07/28/22): Anteverted uterus.  Uterine fibroids-anterior fundal 3.1 x 3 x 2.9 cm, anterior 3.3 x 3.1 x 3.1 cm, fundal 2.3 x 2.1 x 2 cm.  Fibroids increased in size from previous ultrasound 01/30/2015.  Endometrium thickened.  Right ovary within normal limits.  Left ovary within normal limits, follicle 1.3 cm.  No adnexal masses seen.  Small amount of free fluid seen in cul-de-sac

## 2022-12-27 NOTE — H&P (View-Only) (Signed)
GYNECOLOGIC ONCOLOGY NEW PATIENT CONSULTATION  Date of Service: 12/27/2022 Referring Provider: Tara Cole, MD   ASSESSMENT AND PLAN: Renee Hahn is a 52 y.o. woman with EIN.  We reviewed the diagnosis of endometrial intraepithelial neoplasia (EIN) and the treatment options, including medical management (Mirena IUD or progesterone PO) or hysterectomy.    We discussed that surgical management is the recommended treatment unless the patient is not a suitable surgical candidate or otherwise desiring to maintain fertility. Patient is an appropriate candidate for surgery and desires to proceed with surgical management.  We did however discuss that in some cases with central adiposity, patients may not tolerate the Trendelenburg position.  Some of her weight is in her lower extremities, so she may do okay.  We also discussed that given her fibroids and no vaginal deliveries, she may require a mini laparotomy for specimen retrieval.  But we will attempt removal through the vagina first.  The patient is a suitable candidate for hysterectomy via a minimally invasive approach to surgery.  Given that she is perimenopausal, a bilateral salpingo-oophorectomy is also recommended.  We reviewed that she may have an increase in menopausal symptoms. we reviewed that robotic assistance would be used to complete the surgery.  We discussed that endometrial cancer is detected in about 40% of final uterine pathology specimens from patients with EIN. Given this, we would recommend evaluation of lymph nodes at time of surgery with a sentinel lymph node evaluation.  We reviewed the sentinel lymph node technique. Risks and benefits of sentinel lymph node biopsy was reviewed. We reviewed the technique and ICG dye. The patient DOES NOT have an iodine allergy or known liver dysfunction. We reviewed the false negative rate (0.4%), and that 3% of patients with metastatic disease will not have it detected by SLN biopsy in  endometrial cancer. A low risk of allergic reaction to the dye, <0.2% for ICG, has been reported. We also discussed that in the case of failed mapping, which occurs 40% of the time, we would likely not perform a lymphadenectomy given only EIN on preoperative testing.  Potential benefits of sentinel nodes including a higher detection rate for metastasis due to ultrastaging and potential reduction in operative morbidity. However, there remains uncertainty as to the role for treatment of micrometastatic disease. Further, the benefit of operative morbidity associated with the SLN technique in endometrial cancer is not yet completely known. In other patient populations (e.g. the cervical cancer population) there has been observed reductions in morbidity with SLN biopsy compared to pelvic lymphadenectomy. Lymphedema, nerve dysfunction and lymphocysts are all potential risks with the SLN technique as with complete lymphadenectomy. Additional risks to the patient include the risk of damage to an internal organ while operating in an altered view (e.g. the black and white image of the robotic fluorescence imaging mode).   She has elected to proceed with hysterectomy.  We reviewed that if she were to not tolerate Trendelenburg position, I would recommend proceeding with a D&C and levonorgestrel IUD insertion for treatment of EIN. I reviewed that per the CDC US Medical Eligibility Criteria, Selected Practice Recommendations, LND IUD in the setting of prior stroke is given a 2 - "advantages generally outweigh theoretical or proven risks."  Given that the advantages would be to treat a precancer and possible cancer, I discussed with the patient that the benefits of a localized hormone to prevent a cancer would outweigh the theoretical risk.  The goal would still be to use this temporarily as   patient continue to work towards optimization for safe hysterectomy.  Patient was consented for: Total robotic hysterectomy,  bilateral salpingo-oophorectomy, bilateral sentinel lymph node evaluation and biopsy, possible dilation and curettage, levonorgestrel IUD insertion, possible mini laparotomy for specimen retrieval on 01/04/23.  The risks of surgery were discussed in detail and she understands these to including but not limited to bleeding requiring a blood transfusion, infection, injury to adjacent organs (including but not limited to the bowels, bladder, ureters, nerves, blood vessels), thromboembolic events, wound separation, hernia, vaginal cuff separation, possible risk of lymphedema and lymphocyst if lymphadenectomy performed, unforseen complication, possible need for re-exploration, and medical complications such as heart attack, stroke, pneumonia.  If the patient experiences any of these events, she understands that her hospitalization or recovery may be prolonged and that she may need to take additional medications for a prolonged period. The patient will receive DVT and antibiotic prophylaxis as indicated. She voiced a clear understanding. She had the opportunity to ask questions and informed consent was obtained today. She wishes to proceed.  Her METs are >4. But given her history of stroke, will request clearance from PCP. Pt is physically active, generally asymptomatic, so I suspect she will be safe candidate for surgery. All preoperative instructions were reviewed. Postoperative expectations were also reviewed. Written handouts were provided to the patient.  A copy of this note was sent to the patient's referring provider.  [place], MD Gynecologic Oncology   Medical Decision Making I personally spent  TOTAL 60 minutes face-to-face and non-face-to-face in the care of this patient, which includes all pre, intra, and post visit time on the date of service.  ------------  CC: EIN  HISTORY OF PRESENT ILLNESS:  Renee Hahn is a 52 y.o. woman who is seen in consultation at the request of  Tara Cole, MD for evaluation of EIN.  Patient presented to her OB/GYN on 09/02/2022 for consideration of surgery, possibly EMB in the setting of heavy painful menses.  She reported that time bleeding up to 10 to 14 days, changing pads 3-5 times a day.  Per records, patient has a history of thickened endometrium, heavy menses, large fibroids with a negative EMB in the past in 2016.  At that time patient declined surgical option due to expense.  An ultrasound on 07/28/2022 noted a uterus measuring 12.6 cm with few 2 to 3 cm fibroids.  Her endometrium was thickened at that time to 1 cm.  At her visit in February, she was recommended to undergo a D&C, ablation given the patient has a history of stroke, desiring to avoid hormonal therapy in this setting.  Preoperative endometrial biopsy returned with simple hyperplasia without atypia.  She then underwent a dilation and curettage, hysteroscopy with hydrothermal ablation on 11/17/2022.  D&C specimen returned with EIN.  Today, pt reports that she is not currently having vaginal bleeding. She reports some occasional pelvic cramping symptoms.  She otherwise denies abdominal bloating, early satiety, significant weight loss, change in bowel or bladder habits.  She is working on improving her overall health and is doing more walking which is helping her to lose weight.  Of note, patient has a history of stroke in 2022, treated at [facility].  She underwent a thrombectomy and has been treated with low-dose aspirin and statin therapy since.  She was graduated from neurology follow-up to continue follow-up with her PCP.  She reports that her presenting symptom was aphasia, "talking gibberish."  She has had some residual memory loss, slow   word recall since.  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    PAST MEDICAL HISTORY: Past Medical History:  Diagnosis Date   Cholelithiasis    Hay fever    Headache    Iron deficiency anemia    Prediabetes    Stroke (HCC)     Vitamin B12 deficiency 02/27/2021    PAST SURGICAL HISTORY: Past Surgical History:  Procedure Laterality Date   BREAST BIOPSY     BREAST EXCISIONAL BIOPSY Left 12/03/2021   benign   BREAST LUMPECTOMY WITH RADIOACTIVE SEED LOCALIZATION Left 12/03/2021   Procedure: LEFT BREAST LUMPECTOMY WITH RADIOACTIVE SEED LOCALIZATION;  Surgeon: Cornett, Thomas, MD;  Location: [facility];  Service: General;  Laterality: Left;   CHOLECYSTECTOMY N/A 09/21/2019   Procedure: LAPAROSCOPIC CHOLECYSTECTOMY WITH INTRAOPERATIVE CHOLANGIOGRAM;  Surgeon: Cornett, Thomas, MD;  Location: [facility];  Service: General;  Laterality: N/A;   DILITATION & CURRETTAGE/HYSTROSCOPY WITH HYDROTHERMAL ABLATION N/A 11/17/2022   Procedure: DILATATION & CURETTAGE/HYSTEROSCOPY WITH HYDROTHERMAL ABLATION;  Surgeon: Cole, Tara, MD;  Location: [facility] OR;  Service: Gynecology;  Laterality: N/A;    OB/GYN HISTORY: OB History  Gravida Para Term Preterm AB Living  0 0 0 0 0 0  SAB IAB Ectopic Multiple Live Births  0 0 0 0 0      Age at menarche: 15 Age at menopause: n/a Hx of HRT: na/ Hx of STI: gonorhea Last pap: 07/2022, nml History of abnormal pap smears: denies  SCREENING STUDIES:  Last mammogram: 09/2022 Last colonoscopy: Scheduled for 7/25  MEDICATIONS:  Current Outpatient Medications:    acetaminophen (TYLENOL) 650 MG CR tablet, Take 650-1,300 mg by mouth every 8 (eight) hours as needed for pain., Disp: , Rfl:    aspirin EC 81 MG tablet, Take 1 tablet (81 mg total) by mouth daily. Swallow whole., Disp: 90 tablet, Rfl: 3   atorvastatin (LIPITOR) 80 MG tablet, TAKE 1 TABLET BY MOUTH EVERYDAY AT BEDTIME, Disp: 90 tablet, Rfl: 2   ferrous gluconate (FERGON) 324 MG tablet, Take 324 mg by mouth 2 (two) times daily with a meal., Disp: , Rfl:    ibuprofen (ADVIL) 800 MG tablet, Take 1 tablet (800 mg total) by mouth every 8 (eight) hours as needed., Disp: 30 tablet, Rfl: 0   Na Sulfate-K Sulfate-Mg Sulf 17.5-3.13-1.6 GM/177ML SOLN, As  directed by GI office for colon prep, Disp: 354 mL, Rfl: 0   oxyCODONE (OXY IR/ROXICODONE) 5 MG immediate release tablet, Take 1 tablet (5 mg total) by mouth every 6 (six) hours as needed for severe pain., Disp: 8 tablet, Rfl: 0  ALLERGIES: No Known Allergies  FAMILY HISTORY: Family History  Problem Relation Age of Onset   Breast cancer Mother    Prostate cancer Father    Hypertension Maternal Grandmother    Hypertension Paternal Grandfather    Endometrial cancer Neg Hx    Ovarian cancer Neg Hx    Colon cancer Neg Hx     SOCIAL HISTORY: Social History   Socioeconomic History   Marital status: Widowed    Spouse name: Not on file   Number of children: Not on file   Years of education: Not on file   Highest education level: Not on file  Occupational History   Not on file  Tobacco Use   Smoking status: Never   Smokeless tobacco: Never  Vaping Use   Vaping Use: Never used  Substance and Sexual Activity   Alcohol use: Yes    Comment: socially   Drug   use: No   Sexual activity: Not Currently    Partners: Male  Other Topics Concern   Not on file  Social History Narrative   Not on file   Social Determinants of Health   Financial Resource Strain: Not on file  Food Insecurity: Not on file  Transportation Needs: Not on file  Physical Activity: Not on file  Stress: Not on file  Social Connections: Not on file  Intimate Partner Violence: Not on file    REVIEW OF SYSTEMS: New patient intake form was reviewed.  Complete 10-system review is negative except for the following: none  PHYSICAL EXAM: BP 131/75 (BP Location: Left Arm, Patient Position: Sitting)   Pulse 79   Temp 98.9 F (37.2 C)   Resp 18   Ht 4' 10.47" (1.485 m)   Wt 271 lb 12.8 oz (123.3 kg)   SpO2 100%   BMI 55.91 kg/m  Constitutional: No acute distress. Neuro/Psych: Alert, oriented.  Head and Neck: Normocephalic, atraumatic. Neck symmetric without masses. Sclera anicteric.  Respiratory: Normal  work of breathing. Clear to auscultation bilaterally. Cardiovascular: Regular rate and rhythm, no murmurs, rubs, or gallops. Abdomen: Normoactive bowel sounds. Soft, non-distended, non-tender to palpation. No masses appreciated. No evidence of hernia. Well healed RUQ laparoscopic incisions. Extremities: Grossly normal range of motion. Warm, well perfused. No edema bilaterally. Skin: No rashes or lesions. Lymphatic: No cervical, supraclavicular, or inguinal adenopathy. Genitourinary: External genitalia without lesions. Urethral meatus without lesions or prolapse. On speculum exam, vagina and cervix without lesions. Bimanual exam reveals normal cervix and mildly enlarged, mobile uterus. Narrow introitus. Exam chaperoned by Jamie Nadeau, RN   LABORATORY AND RADIOLOGIC DATA: Outside medical records were reviewed to synthesize the above history, along with the history and physical obtained during the visit.  Outside laboratory, pathology, and imaging reports were reviewed, with pertinent results below.   WBC  Date Value Ref Range Status  11/17/2022 10.7 (H) 4.0 - 10.5 K/uL Final   Hemoglobin  Date Value Ref Range Status  11/17/2022 12.3 12.0 - 15.0 g/dL Final  04/30/2020 12.1 12.0 - 15.0 g/dL Final   HCT  Date Value Ref Range Status  11/17/2022 40.6 36.0 - 46.0 % Final   Platelets  Date Value Ref Range Status  11/17/2022 246 150 - 400 K/uL Final   Platelet Count  Date Value Ref Range Status  04/30/2020 255 150 - 400 K/uL Final   Creatinine  Date Value Ref Range Status  12/18/2019 0.82 0.44 - 1.00 mg/dL Final   Creatinine, Ser  Date Value Ref Range Status  06/14/2022 0.68 0.40 - 1.20 mg/dL Final   AST  Date Value Ref Range Status  06/14/2022 34 0 - 37 U/L Final  12/18/2019 66 (H) 15 - 41 U/L Final   ALT  Date Value Ref Range Status  06/14/2022 18 0 - 35 U/L Final  12/18/2019 15 0 - 44 U/L Final   A1c (09/13/22): 5.3  Surgical pathology (09/02/2022): Endometrial biopsy:  Simple hyperplasia without atypia.  Stromal breakdown with fibrin thrombi consistent with bleeding.  Negative for polyp, atypia and carcinoma  Surgical pathology (11/17/22): A. ENDOMETRIUM, CURETTAGE:  Complex atypical hyperplasia (EIN) with morular metaplasia  Stromal breakdown and fibrin thrombi consistent with bleeding  Negative for polyp   Pelvic ultrasound (07/28/22): Anteverted uterus.  Uterine fibroids-anterior fundal 3.1 x 3 x 2.9 cm, anterior 3.3 x 3.1 x 3.1 cm, fundal 2.3 x 2.1 x 2 cm.  Fibroids increased in size from previous ultrasound 01/30/2015.    Endometrium thickened.  Right ovary within normal limits.  Left ovary within normal limits, follicle 1.3 cm.  No adnexal masses seen.  Small amount of free fluid seen in cul-de-sac  

## 2022-12-28 ENCOUNTER — Telehealth: Payer: Self-pay | Admitting: Oncology

## 2022-12-28 ENCOUNTER — Other Ambulatory Visit: Payer: Self-pay | Admitting: Nurse Practitioner

## 2022-12-28 DIAGNOSIS — Z01810 Encounter for preprocedural cardiovascular examination: Secondary | ICD-10-CM

## 2022-12-28 NOTE — Telephone Encounter (Signed)
Renee Hahn left a message that her family is unable to bring her to surgery on 01/04/23 and is wondering if she can move it to 01/11/23.

## 2022-12-28 NOTE — Telephone Encounter (Signed)
Called Renee Hahn back and advised that surgery has been rescheduled to 01/11/23 at 10:00.  Discussed that preop will let her know what time to arrive.  She verbalized understanding and agreement.  She also asked if we can let her know what her blood type is after she has her preop lab work.

## 2022-12-29 ENCOUNTER — Telehealth: Payer: Self-pay | Admitting: *Deleted

## 2022-12-29 NOTE — Telephone Encounter (Signed)
Late entry----12/27/22 per Dr Alvester Morin fax records and surgical optimization form to PCP   12/29/22--Received clearance from PCP recommended cardiology clearance. Clearance sent

## 2022-12-30 NOTE — Progress Notes (Signed)
Sent message, via epic in basket, requesting orders in epic from surgeon.  

## 2023-01-02 NOTE — Progress Notes (Unsigned)
Cardiology Office Note:    Date:  01/04/2023   ID:  Renee Hahn, DOB Aug 18, 1970, MRN [id number]  PCP:  Anne Ng, NP   [facility] Providers Cardiologist:  None     Referring MD: Anne Ng, NP   Chief Complaint  Patient presents with   Pre-op Exam    History of Present Illness:    Renee Hahn is a 52 y.o. female is seen at the request of Clide Cliff MD for pre op cardiac clearance for total hysterectomy. She has a history of iron def anemia, obesity and prior left MCA stroke in 2022 treated with thrombectomy. Echo, TEE and 30 day event monitor at that time were normal except for rare PACs and PVCs. No Afib. No prior history of HTN or DM. Non smoker. She is active and can walk 2-2.5 miles without any dyspnea or chest pain.  Past Medical History:  Diagnosis Date   Cholelithiasis    Hay fever    Headache    Hyperlipidemia    Iron deficiency anemia    Prediabetes    Stroke [facility])    Vitamin B12 deficiency 02/27/2021    Past Surgical History:  Procedure Laterality Date   BREAST BIOPSY     BREAST EXCISIONAL BIOPSY Left 12/03/2021   benign   BREAST LUMPECTOMY WITH RADIOACTIVE SEED LOCALIZATION Left 12/03/2021   Procedure: LEFT BREAST LUMPECTOMY WITH RADIOACTIVE SEED LOCALIZATION;  Surgeon: Harriette Bouillon, MD;  Location: [facility] OR;  Service: General;  Laterality: Left;   CHOLECYSTECTOMY N/A 09/21/2019   Procedure: LAPAROSCOPIC CHOLECYSTECTOMY WITH INTRAOPERATIVE CHOLANGIOGRAM;  Surgeon: Harriette Bouillon, MD;  Location: [facility];  Service: General;  Laterality: N/A;   DILITATION & CURRETTAGE/HYSTROSCOPY WITH HYDROTHERMAL ABLATION N/A 11/17/2022   Procedure: DILATATION & CURETTAGE/HYSTEROSCOPY WITH HYDROTHERMAL ABLATION;  Surgeon: Gerald Leitz, MD;  Location: [facility] OR;  Service: Gynecology;  Laterality: N/A;    Current Medications: Current Meds  Medication Sig   acetaminophen (TYLENOL) 650 MG CR tablet Take 650-1,300 mg by mouth every 8 (eight)  hours as needed for pain.   aspirin EC 81 MG tablet Take 1 tablet (81 mg total) by mouth daily. Swallow whole.   atorvastatin (LIPITOR) 80 MG tablet TAKE 1 TABLET BY MOUTH EVERYDAY AT BEDTIME   ferrous gluconate (FERGON) 324 MG tablet Take 324 mg by mouth 2 (two) times daily with a meal.   trolamine salicylate (ASPERCREME) 10 % cream Apply 1 Application topically as needed for muscle pain.     Allergies:   Patient has no known allergies.   Social History   Socioeconomic History   Marital status: Widowed    Spouse name: Not on file   Number of children: 0   Years of education: Not on file   Highest education level: Not on file  Occupational History   Not on file  Tobacco Use   Smoking status: Never   Smokeless tobacco: Never  Vaping Use   Vaping Use: Never used  Substance and Sexual Activity   Alcohol use: Yes    Comment: socially   Drug use: No   Sexual activity: Not Currently    Partners: Male  Other Topics Concern   Not on file  Social History Narrative   Works for claims at [employer]   Financial Resource Strain: Not on file  Food Insecurity: Not on file  Transportation Needs: Not on file  Physical Activity: Not on file  Stress: Not on file  Social  Connections: Not on file     Family History: The patient's family history includes Breast cancer in her mother; Hypertension in her maternal grandmother and paternal grandfather; Prostate cancer in her father and mother. There is no history of Endometrial cancer, Ovarian cancer, or Colon cancer.  ROS:   Please see the history of present illness.     All other systems reviewed and are negative.  EKGs/Labs/Other Studies Reviewed:    The following studies were reviewed today: See HPI  EKG Interpretation Date/Time:  Tuesday January 04 2023 16:22:10 EDT Ventricular Rate:  62 PR Interval:  148 QRS Duration:  78 QT Interval:  420 QTC Calculation: 426 R Axis:   49  Text  Interpretation: Normal sinus rhythm Normal ECG When compared with ECG of 03-Dec-2021 09:53, No significant change since last tracing Confirmed by [place], Suraya Vidrine [id number]) on 01/04/2023 4:24:13 PM   EKG Interpretation Date/Time:  Tuesday January 04 2023 16:22:10 EDT Ventricular Rate:  62 PR Interval:  148 QRS Duration:  78 QT Interval:  420 QTC Calculation: 426 R Axis:   49  Text Interpretation: Normal sinus rhythm Normal ECG When compared with ECG of 03-Dec-2021 09:53, No significant change since last tracing Confirmed by [place], Johncarlo Maalouf [phone]) on 01/04/2023 4:24:13 PM    Recent Labs: 06/14/2022: ALT 18; BUN 11; Creatinine, Ser 0.68; Potassium 4.2; Sodium 138 09/13/2022: TSH 1.95 11/17/2022: Hemoglobin 12.3; Platelets 246  Recent Lipid Panel    Component Value Date/Time   CHOL 97 06/14/2022 0908   TRIG 61.0 06/14/2022 0908   HDL 34.00 (L) 06/14/2022 0908   CHOLHDL 3 06/14/2022 0908   VLDL 12.2 06/14/2022 0908   LDLCALC 51 06/14/2022 0908     Risk Assessment/Calculations:                Physical Exam:    VS:  BP 138/88 (BP Location: Left Arm, Patient Position: Sitting, Cuff Size: Large)   Pulse 62   Ht 4\' 11"  (1.499 m)   Wt 268 lb 6.4 oz (121.7 kg)   SpO2 100%   BMI 54.21 kg/m     Wt Readings from Last 3 Encounters:  01/04/23 268 lb 6.4 oz (121.7 kg)  12/27/22 271 lb 12.8 oz (123.3 kg)  11/17/22 267 lb (121.1 kg)     GEN:  Well nourished, obese, in no acute distress HEENT: Normal NECK: No JVD; No carotid bruits LYMPHATICS: No lymphadenopathy CARDIAC: RRR, no murmurs, rubs, gallops RESPIRATORY:  Clear to auscultation without rales, wheezing or rhonchi  ABDOMEN: Soft, non-tender, non-distended MUSCULOSKELETAL:  No edema; No deformity  SKIN: Warm and dry NEUROLOGIC:  Alert and oriented x 3 PSYCHIATRIC:  Normal affect   ASSESSMENT:    1. Pre-operative cardiovascular examination   2. Elevated BP without diagnosis of hypertension   3. Uterine bleeding   4. History of  stroke    PLAN:    In order of problems listed above:  Patient has a low CV risk. No history of CAD or CHF. No arrhythmia. BP has been controlled. Prior Echo, TEE, Event monitor normal. No cardiac symptoms at good exercise level. She is cleared to proceed with surgery.            Medication Adjustments/Labs and Tests Ordered: Current medicines are reviewed at length with the patient today.  Concerns regarding medicines are outlined above.  Orders Placed This Encounter  Procedures   EKG 12-Lead   No orders of the defined types were placed in this encounter.   There are  no Patient Instructions on file for this visit.   Signed, Takia Runyon [place], MD  01/04/2023 4:35 PM    [facility]

## 2023-01-03 ENCOUNTER — Other Ambulatory Visit: Payer: Self-pay | Admitting: Gynecologic Oncology

## 2023-01-03 DIAGNOSIS — N8502 Endometrial intraepithelial neoplasia [EIN]: Secondary | ICD-10-CM

## 2023-01-04 ENCOUNTER — Encounter: Payer: Self-pay | Admitting: Cardiology

## 2023-01-04 ENCOUNTER — Ambulatory Visit: Payer: No Typology Code available for payment source | Attending: Cardiology | Admitting: Cardiology

## 2023-01-04 VITALS — BP 138/88 | HR 62 | Ht 59.0 in | Wt 268.4 lb

## 2023-01-04 DIAGNOSIS — Z01810 Encounter for preprocedural cardiovascular examination: Secondary | ICD-10-CM | POA: Diagnosis not present

## 2023-01-04 DIAGNOSIS — N939 Abnormal uterine and vaginal bleeding, unspecified: Secondary | ICD-10-CM

## 2023-01-04 DIAGNOSIS — R030 Elevated blood-pressure reading, without diagnosis of hypertension: Secondary | ICD-10-CM

## 2023-01-04 DIAGNOSIS — Z8673 Personal history of transient ischemic attack (TIA), and cerebral infarction without residual deficits: Secondary | ICD-10-CM | POA: Diagnosis not present

## 2023-01-04 NOTE — Patient Instructions (Signed)
Medication Instructions:  No changes   Lab Work: None  ordered   Testing/Procedures: None ordered   Follow-Up: At [facility], you and your health needs are our priority.  As part of our continuing mission to provide you with exceptional heart care, we have created designated Provider Care Teams.  These Care Teams include your primary Cardiologist (physician) and Advanced Practice Providers (APPs -  Physician Assistants and Nurse Practitioners) who all work together to provide you with the care you need, when you need it.  We recommend signing up for the patient portal called "MyChart".  Sign up information is provided on this After Visit Summary.  MyChart is used to connect with patients for Virtual Visits (Telemedicine).  Patients are able to view lab/test results, encounter notes, upcoming appointments, etc.  Non-urgent messages can be sent to your provider as well.   To learn more about what you can do with MyChart, go to [email or web address].    Your next appointment:  As Needed    Provider:  Dr.Jordan    Cleared for upcoming surgery

## 2023-01-05 ENCOUNTER — Encounter: Payer: Self-pay | Admitting: Physician Assistant

## 2023-01-05 NOTE — Progress Notes (Addendum)
COVID Vaccine Completed: yes  Date of COVID positive in last 90 days:  PCP - Alysia Penna, NP Cardiologist - Peter [place], MD LOV 01/04/23 for pre op clearance  Cardiac clearance by Peter [place] 01/04/23 in Epic   Chest x-ray - n/a EKG - 01/04/23 Epic Stress Test - n/a ECHO - 02/21/21 Epic Cardiac Cath - n/a Pacemaker/ICD device last checked: n/a Spinal Cord Stimulator: n/a  Bowel Prep - light diet the day before  Sleep Study - n/a CPAP -   Fasting Blood Sugar - preDM Checks Blood Sugar _____ times a day  Last dose of GLP1 agonist-  N/A GLP1 instructions:  N/A   Last dose of SGLT-2 inhibitors-  N/A SGLT-2 instructions: N/A   Blood Thinner Instructions:  Time Aspirin Instructions: ASA 81, not holding per Dr. Alden Blas office Last Dose:  Activity level: Can go up a flight of stairs and perform activities of daily living without stopping and without symptoms of chest pain or shortness of breath.  Anesthesia review: Stroke,   Patient denies shortness of breath, fever, cough and chest pain at PAT appointment  Patient verbalized understanding of instructions that were given to them at the PAT appointment. Patient was also instructed that they will need to review over the PAT instructions again at home before surgery.

## 2023-01-05 NOTE — Patient Instructions (Addendum)
SURGICAL WAITING ROOM VISITATION  Patients having surgery or a procedure may have no more than 2 support people in the waiting area - these visitors may rotate.    Children under the age of 35 must have an adult with them who is not the patient.  Due to an increase in RSV and influenza rates and associated hospitalizations, children ages 37 and under may not visit patients in [facility] hospitals.  If the patient needs to stay at the hospital during part of their recovery, the visitor guidelines for inpatient rooms apply. Pre-op nurse will coordinate an appropriate time for 1 support person to accompany patient in pre-op.  This support person may not rotate.    Please refer to the [facility] website for the visitor guidelines for Inpatients (after your surgery is over and you are in a regular room).    Your procedure is scheduled on: 01/11/23   Report to [facility] Main Entrance    Report to admitting at 7:45 AM   Call this number if you have problems the morning of surgery [phone]   Do not eat food :After Midnight.   After Midnight you may have the following liquids until 7:00 AM DAY OF SURGERY  Water Non-Citrus Juices (without pulp, NO RED-Apple, White grape, White cranberry) Black Coffee (NO MILK/CREAM OR CREAMERS, sugar ok)  Clear Tea (NO MILK/CREAM OR CREAMERS, sugar ok) regular and decaf                             Plain Jell-O (NO RED)                                           Fruit ices (not with fruit pulp, NO RED)                                     Popsicles (NO RED)                                                               Sports drinks like Gatorade (NO RED)                     If you have questions, please contact your surgeon's office.   FOLLOW BOWEL PREP AND ANY ADDITIONAL PRE OP INSTRUCTIONS YOU RECEIVED FROM YOUR SURGEON'S OFFICE!!!     Oral Hygiene is also important to reduce your risk of infection.                                     Remember - BRUSH YOUR TEETH THE MORNING OF SURGERY WITH YOUR REGULAR TOOTHPASTE  DENTURES WILL BE REMOVED PRIOR TO SURGERY PLEASE DO NOT APPLY "Poly grip" OR ADHESIVES!!!   Take these medicines the morning of surgery with A SIP OF WATER: Tylenol  You may not have any metal on your body including hair pins, jewelry, and body piercing             Do not wear make-up, lotions, powders, perfumes, or deodorant  Do not wear nail polish including gel and S&S, artificial/acrylic nails, or any other type of covering on natural nails including finger and toenails. If you have artificial nails, gel coating, etc. that needs to be removed by a nail salon please have this removed prior to surgery or surgery may need to be canceled/ delayed if the surgeon/ anesthesia feels like they are unable to be safely monitored.   Do not shave  48 hours prior to surgery.    Do not bring valuables to the hospital. [facility] IS NOT             RESPONSIBLE   FOR VALUABLES.   Contacts, glasses, dentures or bridgework may not be worn into surgery.  DO NOT BRING YOUR HOME MEDICATIONS TO THE HOSPITAL. PHARMACY WILL DISPENSE MEDICATIONS LISTED ON YOUR MEDICATION LIST TO YOU DURING YOUR ADMISSION IN THE HOSPITAL!    Patients discharged on the day of surgery will not be allowed to drive home.  Someone NEEDS to stay with you for the first 24 hours after anesthesia.   Special Instructions: Bring a copy of your healthcare power of attorney and living will documents the day of surgery if you haven't scanned them before.              Please read over the following fact sheets you were given: IF YOU HAVE QUESTIONS ABOUT YOUR PRE-OP INSTRUCTIONS PLEASE CALL [phone] [place]    If you received a COVID test during your pre-op visit  it is requested that you wear a mask when out in public, stay away from anyone that may not be feeling well and notify your surgeon if you develop symptoms. If you  test positive for Covid or have been in contact with anyone that has tested positive in the last 10 days please notify you surgeon.    [facility] - Preparing for Surgery Before surgery, you can play an important role.  Because skin is not sterile, your skin needs to be as free of germs as possible.  You can reduce the number of germs on your skin by washing with CHG (chlorahexidine gluconate) soap before surgery.  CHG is an antiseptic cleaner which kills germs and bonds with the skin to continue killing germs even after washing. Please DO NOT use if you have an allergy to CHG or antibacterial soaps.  If your skin becomes reddened/irritated stop using the CHG and inform your nurse when you arrive at Short Stay. Do not shave (including legs and underarms) for at least 48 hours prior to the first CHG shower.  You may shave your face/neck.  Please follow these instructions carefully:  1.  Shower with CHG Soap the night before surgery and the  morning of surgery.  2.  If you choose to wash your hair, wash your hair first as usual with your normal  shampoo.  3.  After you shampoo, rinse your hair and body thoroughly to remove the shampoo.                             4.  Use CHG as you would any other liquid soap.  You can apply chg directly to the skin and wash.  Gently with a  scrungie or clean washcloth.  5.  Apply the CHG Soap to your body ONLY FROM THE NECK DOWN.   Do   not use on face/ open                           Wound or open sores. Avoid contact with eyes, ears mouth and   genitals (private parts).                       Wash face,  Genitals (private parts) with your normal soap.             6.  Wash thoroughly, paying special attention to the area where your    surgery  will be performed.  7.  Thoroughly rinse your body with warm water from the neck down.  8.  DO NOT shower/wash with your normal soap after using and rinsing off the CHG Soap.                9.  Pat yourself dry with a clean  towel.            10.  Wear clean pajamas.            11.  Place clean sheets on your bed the night of your first shower and do not  sleep with pets. Day of Surgery : Do not apply any lotions/deodorants the morning of surgery.  Please wear clean clothes to the hospital/surgery center.  FAILURE TO FOLLOW THESE INSTRUCTIONS MAY RESULT IN THE CANCELLATION OF YOUR SURGERY  PATIENT SIGNATURE_________________________________  NURSE SIGNATURE__________________________________  ________________________________________________________________________ WHAT IS A BLOOD TRANSFUSION? Blood Transfusion Information  A transfusion is the replacement of blood or some of its parts. Blood is made up of multiple cells which provide different functions. Red blood cells carry oxygen and are used for blood loss replacement. White blood cells fight against infection. Platelets control bleeding. Plasma helps clot blood. Other blood products are available for specialized needs, such as hemophilia or other clotting disorders. BEFORE THE TRANSFUSION  Who gives blood for transfusions?  Healthy volunteers who are fully evaluated to make sure their blood is safe. This is blood bank blood. Transfusion therapy is the safest it has ever been in the practice of medicine. Before blood is taken from a donor, a complete history is taken to make sure that person has no history of diseases nor engages in risky social behavior (examples are intravenous drug use or sexual activity with multiple partners). The donor's travel history is screened to minimize risk of transmitting infections, such as malaria. The donated blood is tested for signs of infectious diseases, such as HIV and hepatitis. The blood is then tested to be sure it is compatible with you in order to minimize the chance of a transfusion reaction. If you or a relative donates blood, this is often done in anticipation of surgery and is not appropriate for emergency  situations. It takes many days to process the donated blood. RISKS AND COMPLICATIONS Although transfusion therapy is very safe and saves many lives, the main dangers of transfusion include:  Getting an infectious disease. Developing a transfusion reaction. This is an allergic reaction to something in the blood you were given. Every precaution is taken to prevent this. The decision to have a blood transfusion has been considered carefully by your caregiver before blood is given. Blood is not given unless the benefits outweigh the risks. AFTER THE  TRANSFUSION Right after receiving a blood transfusion, you will usually feel much better and more energetic. This is especially true if your red blood cells have gotten low (anemic). The transfusion raises the level of the red blood cells which carry oxygen, and this usually causes an energy increase. The nurse administering the transfusion will monitor you carefully for complications. HOME CARE INSTRUCTIONS  No special instructions are needed after a transfusion. You may find your energy is better. Speak with your caregiver about any limitations on activity for underlying diseases you may have. SEEK MEDICAL CARE IF:  Your condition is not improving after your transfusion. You develop redness or irritation at the intravenous (IV) site. SEEK IMMEDIATE MEDICAL CARE IF:  Any of the following symptoms occur over the next 12 hours: Shaking chills. You have a temperature by mouth above 102 F (38.9 C), not controlled by medicine. Chest, back, or muscle pain. People around you feel you are not acting correctly or are confused. Shortness of breath or difficulty breathing. Dizziness and fainting. You get a rash or develop hives. You have a decrease in urine output. Your urine turns a dark color or changes to pink, red, or brown. Any of the following symptoms occur over the next 10 days: You have a temperature by mouth above 102 F (38.9 C), not controlled  by medicine. Shortness of breath. Weakness after normal activity. The white part of the eye turns yellow (jaundice). You have a decrease in the amount of urine or are urinating less often. Your urine turns a dark color or changes to pink, red, or brown. Document Released: 06/18/2000 Document Revised: 09/13/2011 Document Reviewed: 02/05/2008 [facility] Patient Information 2014 [place], [place].  _______________________________________________________________________

## 2023-01-07 ENCOUNTER — Other Ambulatory Visit: Payer: Self-pay

## 2023-01-07 ENCOUNTER — Encounter (HOSPITAL_COMMUNITY): Payer: Self-pay

## 2023-01-07 ENCOUNTER — Encounter (HOSPITAL_COMMUNITY)
Admission: RE | Admit: 2023-01-07 | Discharge: 2023-01-07 | Disposition: A | Discharge status: Home / Self Care | Payer: No Typology Code available for payment source | Source: Ambulatory Visit | Attending: Psychiatry | Admitting: Psychiatry

## 2023-01-07 DIAGNOSIS — Z8673 Personal history of transient ischemic attack (TIA), and cerebral infarction without residual deficits: Secondary | ICD-10-CM | POA: Diagnosis not present

## 2023-01-07 DIAGNOSIS — N8502 Endometrial intraepithelial neoplasia [EIN]: Secondary | ICD-10-CM | POA: Diagnosis not present

## 2023-01-07 DIAGNOSIS — Z1211 Encounter for screening for malignant neoplasm of colon: Secondary | ICD-10-CM | POA: Diagnosis not present

## 2023-01-07 DIAGNOSIS — Z01812 Encounter for preprocedural laboratory examination: Secondary | ICD-10-CM | POA: Diagnosis present

## 2023-01-07 LAB — COMPREHENSIVE METABOLIC PANEL
ALT: 18 U/L (ref 0–44)
AST: 28 U/L (ref 15–41)
Albumin: 3.3 g/dL — ABNORMAL LOW (ref 3.5–5.0)
Alkaline Phosphatase: 82 U/L (ref 38–126)
Anion gap: 8 (ref 5–15)
BUN: 13 mg/dL (ref 6–20)
CO2: 25 mmol/L (ref 22–32)
Calcium: 8.7 mg/dL — ABNORMAL LOW (ref 8.9–10.3)
Chloride: 105 mmol/L (ref 98–111)
Creatinine, Ser: 0.8 mg/dL (ref 0.44–1.00)
GFR, Estimated: >60 mL/min (ref >60)
Glucose, Bld: 112 mg/dL — ABNORMAL HIGH (ref 70–99)
Potassium: 3.6 mmol/L (ref 3.5–5.1)
Sodium: 138 mmol/L (ref 135–145)
Total Bilirubin: 0.2 mg/dL — ABNORMAL LOW (ref 0.3–1.2)
Total Protein: 7 g/dL (ref 6.5–8.1)

## 2023-01-07 LAB — CBC
HCT: 40.5 % (ref 36.0–46.0)
Hemoglobin: 12.2 g/dL (ref 12.0–15.0)
MCH: 23.5 pg — ABNORMAL LOW (ref 26.0–34.0)
MCHC: 30.1 g/dL (ref 30.0–36.0)
MCV: 77.9 fL — ABNORMAL LOW (ref 80.0–100.0)
Platelets: 254 10*3/uL (ref 150–400)
RBC: 5.2 MIL/uL — ABNORMAL HIGH (ref 3.87–5.11)
RDW: 16.9 % — ABNORMAL HIGH (ref 11.5–15.5)
WBC: 10.2 10*3/uL (ref 4.0–10.5)
nRBC: 0 % (ref 0.0–0.2)

## 2023-01-07 LAB — TYPE AND SCREEN

## 2023-01-10 ENCOUNTER — Encounter (HOSPITAL_COMMUNITY): Payer: Self-pay | Admitting: Physician Assistant

## 2023-01-10 ENCOUNTER — Telehealth: Payer: Self-pay | Admitting: Surgery

## 2023-01-10 NOTE — Anesthesia Preprocedure Evaluation (Signed)
Anesthesia Evaluation  Patient identified by MRN, date of birth, ID band Patient awake    Reviewed: Allergy & Precautions, NPO status , Patient's Chart, lab work & pertinent test results  History of Anesthesia Complications Negative for: history of anesthetic complications  Airway Mallampati: II  TM Distance: >3 FB Neck ROM: Full   Comment: Previous grade I view with Miller 2, easy mask Dental  (+) Dental Advisory Given,    Pulmonary neg pulmonary ROS   Pulmonary exam normal breath sounds clear to auscultation       Cardiovascular (-) hypertension(-) angina (-) Past MI, (-) Cardiac Stents and (-) CABG (-) dysrhythmias  Rhythm:Regular Rate:Normal  HLD   Neuro/Psych  Headaches, neg Seizures CVA (02/2021 treated with thrombectomy. Echo, TEE and 30 day event monitor at that time were normal except for rare PACs and PVCs.; memory problems), Residual Symptoms    GI/Hepatic negative GI ROS, Neg liver ROS,,,  Endo/Other    Morbid obesityPre-diabetes  Renal/GU negative Renal ROS     Musculoskeletal   Abdominal  (+) + obese  Peds  Hematology  (+) Blood dyscrasia (iron deficiency), anemia   Anesthesia Other Findings   Reproductive/Obstetrics Complex atypical endometrial hyperplasia                             Anesthesia Physical Anesthesia Plan  ASA: 3  Anesthesia Plan: General   Post-op Pain Management: Tylenol PO (pre-op)*   Induction: Intravenous  PONV Risk Score and Plan: 3 and Ondansetron, Dexamethasone, Midazolam, Scopolamine patch - Pre-op and Treatment may vary due to age or medical condition  Airway Management Planned: Oral ETT  Additional Equipment:   Intra-op Plan:   Post-operative Plan: Extubation in OR  Informed Consent: I have reviewed the patients History and Physical, chart, labs and discussed the procedure including the risks, benefits and alternatives for the proposed  anesthesia with the patient or authorized representative who has indicated his/her understanding and acceptance.     Dental advisory given  Plan Discussed with: CRNA and Anesthesiologist  Anesthesia Plan Comments: (Risks of general anesthesia discussed including, but not limited to, sore throat, hoarse voice, chipped/damaged teeth, injury to vocal cords, nausea and vomiting, allergic reactions, lung infection, heart attack, stroke, and death. All questions answered. )        Anesthesia Quick Evaluation

## 2023-01-10 NOTE — Progress Notes (Signed)
Anesthesia Chart Review   Case: [id number] Date/Time: 01/27/23 0730   Procedure: COLONOSCOPY WITH PROPOFOL   Anesthesia type: Monitor Anesthesia Care   Pre-op diagnosis: colon cancer screening   [facility] ROOM 4 / [facility]   Surgeons: Napoleon Form, MD       DISCUSSION:52 y.o. never smoker with h/o left MCA stroke in 2022 treated with thrombectomy , scheduled for above procedure 01/27/2023 with Dr. Marsa Aris.   Pt last seen by cardiology 01/04/23. Per OV note, "Patient has a low CV risk. No history of CAD or CHF. No arrhythmia. BP has been controlled. Prior Echo, TEE, Event monitor normal. No cardiac symptoms at good exercise level. She is cleared to proceed with surgery."  Anticipate pt can proceed with planned procedure barring acute status change.   VS: BP (!) 139/93   Pulse 70   Temp 37.1 C (Oral)   Resp 16   Ht 4\' 11"  (1.499 m)   Wt 123.4 kg   SpO2 98%   BMI 54.94 kg/m   PROVIDERS: Nche, Bonna Gains, NP is PCP   [place], Peter, MD is Cardiologist  LABS: Labs reviewed: Acceptable for surgery. (all labs ordered are listed, but only abnormal results are displayed)  Labs Reviewed  CBC - Abnormal; Notable for the following components:      Result Value   RBC 5.20 (*)    MCV 77.9 (*)    MCH 23.5 (*)    RDW 16.9 (*)    All other components within normal limits  COMPREHENSIVE METABOLIC PANEL - Abnormal; Notable for the following components:   Glucose, Bld 112 (*)    Calcium 8.7 (*)    Albumin 3.3 (*)    Total Bilirubin 0.2 (*)    All other components within normal limits  TYPE AND SCREEN     IMAGES:   EKG:   CV: Echo 02/24/2021 Left Ventricle: EF: 55-60%.    Left Atrium: no thrombus in left atrial appendage.    Left Atrium: Injection of agitated saline documents no interatrial  shunt.  Past Medical History:  Diagnosis Date   Cholelithiasis    Hay fever    Headache    past   Hyperlipidemia    Iron deficiency anemia    Prediabetes     Stroke (HCC)    Vitamin B12 deficiency 02/27/2021    Past Surgical History:  Procedure Laterality Date   BREAST BIOPSY     BREAST EXCISIONAL BIOPSY Left 12/03/2021   benign   BREAST LUMPECTOMY WITH RADIOACTIVE SEED LOCALIZATION Left 12/03/2021   Procedure: LEFT BREAST LUMPECTOMY WITH RADIOACTIVE SEED LOCALIZATION;  Surgeon: Harriette Bouillon, MD;  Location: [facility] OR;  Service: General;  Laterality: Left;   CHOLECYSTECTOMY N/A 09/21/2019   Procedure: LAPAROSCOPIC CHOLECYSTECTOMY WITH INTRAOPERATIVE CHOLANGIOGRAM;  Surgeon: Harriette Bouillon, MD;  Location: [facility] ORS;  Service: General;  Laterality: N/A;   DILITATION & CURRETTAGE/HYSTROSCOPY WITH HYDROTHERMAL ABLATION N/A 11/17/2022   Procedure: DILATATION & CURETTAGE/HYSTEROSCOPY WITH HYDROTHERMAL ABLATION;  Surgeon: Gerald Leitz, MD;  Location[facility] OR;  Service: Gynecology;  Laterality: N/A;   THROMBECTOMY  2022    MEDICATIONS:  acetaminophen (TYLENOL) 650 MG CR tablet   aspirin EC 81 MG tablet   atorvastatin (LIPITOR) 80 MG tablet   ferrous gluconate (FERGON) 324 MG tablet   trolamine salicylate (ASPERCREME) 10 % cream   No current facility-administered medications for this encounter.    Jodell Cipro Ward, PA-C [facility] Pre-Surgical Testing [phone]

## 2023-01-10 NOTE — Anesthesia Preprocedure Evaluation (Deleted)
Anesthesia Evaluation    Airway        Dental   Pulmonary           Cardiovascular      Neuro/Psych    GI/Hepatic   Endo/Other    Renal/GU      Musculoskeletal   Abdominal   Peds  Hematology   Anesthesia Other Findings   Reproductive/Obstetrics                             Anesthesia Physical Anesthesia Plan  ASA:   Anesthesia Plan:    Post-op Pain Management:    Induction:   PONV Risk Score and Plan:   Airway Management Planned:   Additional Equipment:   Intra-op Plan:   Post-operative Plan:   Informed Consent:   Plan Discussed with:   Anesthesia Plan Comments: (See PAT note 01/07/2023)       Anesthesia Quick Evaluation

## 2023-01-10 NOTE — Telephone Encounter (Signed)
Telephone call to check on pre-operative status.  Patient compliant with pre-operative instructions.  Reinforced nothing to eat after midnight. Clear liquids until 6:15am. Patient to arrive at 7:15am. Verified that post-op medications have been sent to patient's preferred pharmacy.  No questions or concerns voiced.  Instructed to call for any needs.

## 2023-01-11 ENCOUNTER — Encounter (HOSPITAL_COMMUNITY)
Admission: RE | Disposition: A | Discharge status: Home / Self Care | Payer: Self-pay | Source: Home / Self Care | Attending: Psychiatry

## 2023-01-11 ENCOUNTER — Ambulatory Visit (HOSPITAL_BASED_OUTPATIENT_CLINIC_OR_DEPARTMENT_OTHER): Payer: No Typology Code available for payment source | Admitting: Anesthesiology

## 2023-01-11 ENCOUNTER — Other Ambulatory Visit: Payer: Self-pay

## 2023-01-11 ENCOUNTER — Encounter (HOSPITAL_COMMUNITY): Payer: Self-pay | Admitting: Psychiatry

## 2023-01-11 ENCOUNTER — Ambulatory Visit (HOSPITAL_COMMUNITY)
Admission: RE | Admit: 2023-01-11 | Discharge: 2023-01-11 | Disposition: A | Discharge status: Home / Self Care | Payer: No Typology Code available for payment source | Attending: Psychiatry | Admitting: Psychiatry

## 2023-01-11 ENCOUNTER — Ambulatory Visit (HOSPITAL_COMMUNITY): Payer: Self-pay | Admitting: Anesthesiology

## 2023-01-11 DIAGNOSIS — N83202 Unspecified ovarian cyst, left side: Secondary | ICD-10-CM | POA: Diagnosis not present

## 2023-01-11 DIAGNOSIS — N83201 Unspecified ovarian cyst, right side: Secondary | ICD-10-CM | POA: Insufficient documentation

## 2023-01-11 DIAGNOSIS — Z6841 Body Mass Index (BMI) 40.0 and over, adult: Secondary | ICD-10-CM | POA: Diagnosis not present

## 2023-01-11 DIAGNOSIS — E785 Hyperlipidemia, unspecified: Secondary | ICD-10-CM | POA: Insufficient documentation

## 2023-01-11 DIAGNOSIS — I10 Essential (primary) hypertension: Secondary | ICD-10-CM | POA: Diagnosis not present

## 2023-01-11 DIAGNOSIS — N8502 Endometrial intraepithelial neoplasia [EIN]: Secondary | ICD-10-CM

## 2023-01-11 DIAGNOSIS — D259 Leiomyoma of uterus, unspecified: Secondary | ICD-10-CM | POA: Diagnosis not present

## 2023-01-11 DIAGNOSIS — R7303 Prediabetes: Secondary | ICD-10-CM | POA: Insufficient documentation

## 2023-01-11 HISTORY — PX: ROBOTIC ASSISTED TOTAL HYSTERECTOMY WITH BILATERAL SALPINGO OOPHERECTOMY: SHX6086

## 2023-01-11 HISTORY — PX: SENTINEL NODE BIOPSY: SHX6608

## 2023-01-11 LAB — TYPE AND SCREEN
ABO/RH(D): B POS
Antibody Screen: NEGATIVE

## 2023-01-11 LAB — POCT PREGNANCY, URINE: Preg Test, Ur: NEGATIVE

## 2023-01-11 LAB — ABO/RH: ABO/RH(D): B POS

## 2023-01-11 SURGERY — HYSTERECTOMY, TOTAL, ROBOT-ASSISTED, LAPAROSCOPIC, WITH BILATERAL SALPINGO-OOPHORECTOMY
Anesthesia: General | Site: Abdomen

## 2023-01-11 MED ORDER — MIDAZOLAM HCL 2 MG/2ML IJ SOLN
INTRAMUSCULAR | Status: AC
  Filled 2023-01-11: qty 2

## 2023-01-11 MED ORDER — PROPOFOL 10 MG/ML IV BOLUS
INTRAVENOUS | Status: AC
  Filled 2023-01-11: qty 20

## 2023-01-11 MED ORDER — LACTATED RINGERS IR SOLN
Status: DC | PRN
  Administered 2023-01-11: 1000 mL

## 2023-01-11 MED ORDER — ONDANSETRON HCL 4 MG/2ML IJ SOLN
INTRAMUSCULAR | Status: AC
  Filled 2023-01-11: qty 2

## 2023-01-11 MED ORDER — ROCURONIUM BROMIDE 10 MG/ML (PF) SYRINGE
PREFILLED_SYRINGE | INTRAVENOUS | Status: AC
  Filled 2023-01-11: qty 10

## 2023-01-11 MED ORDER — SODIUM CHLORIDE (PF) 0.9 % IJ SOLN
INTRAMUSCULAR | Status: AC
  Filled 2023-01-11: qty 20

## 2023-01-11 MED ORDER — OXYCODONE HCL 5 MG PO TABS
ORAL_TABLET | ORAL | Status: AC
  Filled 2023-01-11: qty 1

## 2023-01-11 MED ORDER — LIDOCAINE HCL (CARDIAC) PF 100 MG/5ML IV SOSY
PREFILLED_SYRINGE | INTRAVENOUS | Status: DC | PRN
  Administered 2023-01-11: 100 mg via INTRAVENOUS

## 2023-01-11 MED ORDER — DEXAMETHASONE SODIUM PHOSPHATE 4 MG/ML IJ SOLN
4.0000 mg | INTRAMUSCULAR | Status: AC
  Administered 2023-01-11: 8 mg via INTRAVENOUS

## 2023-01-11 MED ORDER — CEFAZOLIN IN SODIUM CHLORIDE 3-0.9 GM/100ML-% IV SOLN
3.0000 g | INTRAVENOUS | Status: AC
  Administered 2023-01-11: 3 g via INTRAVENOUS
  Filled 2023-01-11: qty 100

## 2023-01-11 MED ORDER — FENTANYL CITRATE (PF) 100 MCG/2ML IJ SOLN
INTRAMUSCULAR | Status: DC | PRN
  Administered 2023-01-11: 100 ug via INTRAVENOUS
  Administered 2023-01-11 (×2): 50 ug via INTRAVENOUS

## 2023-01-11 MED ORDER — GABAPENTIN 300 MG PO CAPS
300.0000 mg | ORAL_CAPSULE | ORAL | Status: AC
  Administered 2023-01-11: 300 mg via ORAL
  Filled 2023-01-11: qty 1

## 2023-01-11 MED ORDER — BUPIVACAINE HCL 0.25 % IJ SOLN
INTRAMUSCULAR | Status: DC | PRN
  Administered 2023-01-11: 30 mL
  Administered 2023-01-11: 20 mL

## 2023-01-11 MED ORDER — AMISULPRIDE (ANTIEMETIC) 5 MG/2ML IV SOLN
INTRAVENOUS | Status: AC
  Filled 2023-01-11: qty 4

## 2023-01-11 MED ORDER — PHENYLEPHRINE 80 MCG/ML (10ML) SYRINGE FOR IV PUSH (FOR BLOOD PRESSURE SUPPORT)
PREFILLED_SYRINGE | INTRAVENOUS | Status: DC | PRN
  Administered 2023-01-11: 160 ug via INTRAVENOUS
  Administered 2023-01-11: 80 ug via INTRAVENOUS

## 2023-01-11 MED ORDER — OXYCODONE HCL 5 MG PO TABS: 5.0000 mg | ORAL_TABLET | ORAL | 0 refills | Status: DC | PRN

## 2023-01-11 MED ORDER — ORAL CARE MOUTH RINSE: 15.0000 mL | Freq: Once | OROMUCOSAL | Status: AC

## 2023-01-11 MED ORDER — ROCURONIUM BROMIDE 100 MG/10ML IV SOLN
INTRAVENOUS | Status: DC | PRN
  Administered 2023-01-11 (×2): 10 mg via INTRAVENOUS
  Administered 2023-01-11: 60 mg via INTRAVENOUS
  Administered 2023-01-11: 20 mg via INTRAVENOUS

## 2023-01-11 MED ORDER — FENTANYL CITRATE (PF) 100 MCG/2ML IJ SOLN
INTRAMUSCULAR | Status: AC
  Filled 2023-01-11: qty 2

## 2023-01-11 MED ORDER — DEXAMETHASONE SODIUM PHOSPHATE 10 MG/ML IJ SOLN
INTRAMUSCULAR | Status: AC
  Filled 2023-01-11: qty 1

## 2023-01-11 MED ORDER — LIDOCAINE HCL (PF) 2 % IJ SOLN
INTRAMUSCULAR | Status: AC
  Filled 2023-01-11: qty 5

## 2023-01-11 MED ORDER — HYDROMORPHONE HCL 2 MG/ML IJ SOLN
INTRAMUSCULAR | Status: AC
  Filled 2023-01-11: qty 1

## 2023-01-11 MED ORDER — SUCCINYLCHOLINE CHLORIDE 200 MG/10ML IV SOSY
PREFILLED_SYRINGE | INTRAVENOUS | Status: AC
  Filled 2023-01-11: qty 10

## 2023-01-11 MED ORDER — LACTATED RINGERS IV SOLN: INTRAVENOUS | Status: DC

## 2023-01-11 MED ORDER — SCOPOLAMINE 1 MG/3DAYS TD PT72
1.0000 | MEDICATED_PATCH | TRANSDERMAL | Status: DC
  Administered 2023-01-11: 1.5 mg via TRANSDERMAL
  Filled 2023-01-11: qty 1

## 2023-01-11 MED ORDER — AMISULPRIDE (ANTIEMETIC) 5 MG/2ML IV SOLN
10.0000 mg | Freq: Once | INTRAVENOUS | Status: AC | PRN
  Administered 2023-01-11: 10 mg via INTRAVENOUS

## 2023-01-11 MED ORDER — FENTANYL CITRATE PF 50 MCG/ML IJ SOSY: 25.0000 ug | PREFILLED_SYRINGE | INTRAMUSCULAR | Status: DC | PRN

## 2023-01-11 MED ORDER — CHLORHEXIDINE GLUCONATE 0.12 % MT SOLN
15.0000 mL | Freq: Once | OROMUCOSAL | Status: AC
  Administered 2023-01-11: 15 mL via OROMUCOSAL

## 2023-01-11 MED ORDER — BUPIVACAINE HCL 0.25 % IJ SOLN
INTRAMUSCULAR | Status: AC
  Filled 2023-01-11: qty 1

## 2023-01-11 MED ORDER — IBUPROFEN 800 MG PO TABS: 800.0000 mg | ORAL_TABLET | Freq: Three times a day (TID) | ORAL | 0 refills | Status: DC | PRN

## 2023-01-11 MED ORDER — OXYCODONE HCL 5 MG PO TABS
5.0000 mg | ORAL_TABLET | Freq: Once | ORAL | Status: AC | PRN
  Administered 2023-01-11: 5 mg via ORAL

## 2023-01-11 MED ORDER — BUPIVACAINE LIPOSOME 1.3 % IJ SUSP
INTRAMUSCULAR | Status: AC
  Filled 2023-01-11: qty 20

## 2023-01-11 MED ORDER — PROPOFOL 10 MG/ML IV BOLUS
INTRAVENOUS | Status: DC | PRN
  Administered 2023-01-11: 200 mg via INTRAVENOUS

## 2023-01-11 MED ORDER — STERILE WATER FOR IRRIGATION IR SOLN
Status: DC | PRN
  Administered 2023-01-11: 1000 mL

## 2023-01-11 MED ORDER — LEVONORGESTREL 20 MCG/DAY IU IUD: 1.0000 | INTRAUTERINE_SYSTEM | INTRAUTERINE | Status: DC

## 2023-01-11 MED ORDER — SODIUM CHLORIDE (PF) 0.9 % IJ SOLN
INTRAMUSCULAR | Status: DC | PRN
  Administered 2023-01-11: 20 mL

## 2023-01-11 MED ORDER — OXYCODONE HCL 5 MG/5ML PO SOLN: 5.0000 mg | Freq: Once | ORAL | Status: AC | PRN

## 2023-01-11 MED ORDER — SENNOSIDES-DOCUSATE SODIUM 8.6-50 MG PO TABS: 2.0000 | ORAL_TABLET | Freq: Every day | ORAL | 0 refills | Status: DC

## 2023-01-11 MED ORDER — HYDROMORPHONE HCL 1 MG/ML IJ SOLN
INTRAMUSCULAR | Status: DC | PRN
  Administered 2023-01-11 (×4): .5 mg via INTRAVENOUS

## 2023-01-11 MED ORDER — POVIDONE-IODINE 10 % EX SWAB: 2.0000 | Freq: Once | CUTANEOUS | Status: DC

## 2023-01-11 MED ORDER — MIDAZOLAM HCL 5 MG/5ML IJ SOLN
INTRAMUSCULAR | Status: DC | PRN
  Administered 2023-01-11: 2 mg via INTRAVENOUS

## 2023-01-11 MED ORDER — STERILE WATER FOR INJECTION IJ SOLN
INTRAMUSCULAR | Status: DC | PRN
  Administered 2023-01-11: 10 mL via SURGICAL_CAVITY

## 2023-01-11 MED ORDER — HEPARIN SODIUM (PORCINE) 5000 UNIT/ML IJ SOLN
5000.0000 [IU] | INTRAMUSCULAR | Status: AC
  Administered 2023-01-11: 5000 [IU] via SUBCUTANEOUS
  Filled 2023-01-11: qty 1

## 2023-01-11 MED ORDER — STERILE WATER FOR INJECTION IJ SOLN
INTRAMUSCULAR | Status: DC | PRN
  Administered 2023-01-11: 10 mL

## 2023-01-11 MED ORDER — BUPIVACAINE LIPOSOME 1.3 % IJ SUSP
INTRAMUSCULAR | Status: DC | PRN
  Administered 2023-01-11: 20 mL

## 2023-01-11 MED ORDER — METRONIDAZOLE 500 MG/100ML IV SOLN
500.0000 mg | INTRAVENOUS | Status: AC
  Administered 2023-01-11: 500 mg via INTRAVENOUS
  Filled 2023-01-11 (×2): qty 100

## 2023-01-11 MED ORDER — SUGAMMADEX SODIUM 200 MG/2ML IV SOLN
INTRAVENOUS | Status: DC | PRN
  Administered 2023-01-11 (×2): 100 mg via INTRAVENOUS

## 2023-01-11 MED ORDER — ACETAMINOPHEN 500 MG PO TABS
1000.0000 mg | ORAL_TABLET | ORAL | Status: AC
  Administered 2023-01-11: 1000 mg via ORAL
  Filled 2023-01-11: qty 2

## 2023-01-11 MED ORDER — ONDANSETRON HCL 4 MG/2ML IJ SOLN
INTRAMUSCULAR | Status: DC | PRN
  Administered 2023-01-11 (×2): 4 mg via INTRAVENOUS

## 2023-01-11 SURGICAL SUPPLY — 81 items
ADH SKN CLS APL DERMABOND .7 (GAUZE/BANDAGES/DRESSINGS) ×2
AGENT HMST KT MTR STRL THRMB (HEMOSTASIS)
APL ESCP 34 STRL LF DISP (HEMOSTASIS)
APPLICATOR SURGIFLO ENDO (HEMOSTASIS) IMPLANT
BAG LAPAROSCOPIC 12 15 PORT 16 (BASKET) ×1 IMPLANT
BAG RETRIEVAL 12/15 (BASKET) ×2
BLADE EXTENDED COATED 6.5IN (ELECTRODE) ×1 IMPLANT
BLADE SURG SZ10 CARB STEEL (BLADE) ×1 IMPLANT
COVER BACK TABLE 60X90IN (DRAPES) ×3 IMPLANT
COVER TIP SHEARS 8 DVNC (MISCELLANEOUS) ×3 IMPLANT
DERMABOND ADVANCED .7 DNX12 (GAUZE/BANDAGES/DRESSINGS) ×3 IMPLANT
DRAPE ARM DVNC X/XI (DISPOSABLE) ×12 IMPLANT
DRAPE COLUMN DVNC XI (DISPOSABLE) ×3 IMPLANT
DRAPE SHEET LG 3/4 BI-LAMINATE (DRAPES) ×3 IMPLANT
DRAPE SURG IRRIG POUCH 19X23 (DRAPES) ×3 IMPLANT
DRIVER NDL MEGA SUTCUT DVNCXI (INSTRUMENTS) ×2 IMPLANT
DRIVER NDLE MEGA SUTCUT DVNCXI (INSTRUMENTS) ×2 IMPLANT
DRSG OPSITE POSTOP 4X6 (GAUZE/BANDAGES/DRESSINGS) IMPLANT
DRSG OPSITE POSTOP 4X8 (GAUZE/BANDAGES/DRESSINGS) IMPLANT
ELECT PENCIL ROCKER SW 15FT (MISCELLANEOUS) ×1 IMPLANT
ELECT REM PT RETURN 15FT ADLT (MISCELLANEOUS) ×3 IMPLANT
FORCEPS BPLR FENES DVNC XI (FORCEP) ×3 IMPLANT
FORCEPS PROGRASP DVNC XI (FORCEP) ×3 IMPLANT
GAUZE 4X4 16PLY ~~LOC~~+RFID DBL (SPONGE) ×3 IMPLANT
GLOVE BIO SURGEON STRL SZ 6 (GLOVE) ×12 IMPLANT
GLOVE BIO SURGEON STRL SZ 6.5 (GLOVE) ×3 IMPLANT
GLOVE BIOGEL PI IND STRL 6.5 (GLOVE) ×6 IMPLANT
GOWN STRL REUS W/ TWL LRG LVL3 (GOWN DISPOSABLE) ×12 IMPLANT
GOWN STRL REUS W/TWL LRG LVL3 (GOWN DISPOSABLE) ×8
GRASPER SUT TROCAR 14GX15 (MISCELLANEOUS) IMPLANT
HOLDER FOLEY CATH W/STRAP (MISCELLANEOUS) IMPLANT
IRRIG SUCT STRYKERFLOW 2 WTIP (MISCELLANEOUS) ×2
IRRIGATION SUCT STRKRFLW 2 WTP (MISCELLANEOUS) ×3 IMPLANT
KIT PROCEDURE DVNC SI (MISCELLANEOUS) ×1 IMPLANT
KIT TURNOVER KIT A (KITS) IMPLANT
LIGASURE IMPACT 36 18CM CVD LR (INSTRUMENTS) IMPLANT
MANIPULATOR ADVINCU DEL 3.0 PL (MISCELLANEOUS) IMPLANT
MANIPULATOR ADVINCU DEL 3.5 PL (MISCELLANEOUS) ×1 IMPLANT
MANIPULATOR UTERINE 4.5 ZUMI (MISCELLANEOUS) IMPLANT
NDL HYPO 21X1.5 SAFETY (NEEDLE) ×2 IMPLANT
NDL INSUFFLATION 14GA 120MM (NEEDLE) IMPLANT
NDL SPNL 20GX3.5 QUINCKE YW (NEEDLE) IMPLANT
NEEDLE HYPO 21X1.5 SAFETY (NEEDLE) ×2 IMPLANT
NEEDLE INSUFFLATION 14GA 120MM (NEEDLE) IMPLANT
NEEDLE SPNL 20GX3.5 QUINCKE YW (NEEDLE) ×2 IMPLANT
OBTURATOR OPTICAL STND 8 DVNC (TROCAR) ×2
OBTURATOR OPTICALSTD 8 DVNC (TROCAR) ×3 IMPLANT
PACK ROBOT GYN CUSTOM WL (TRAY / TRAY PROCEDURE) ×3 IMPLANT
PAD ARMBOARD 7.5X6 YLW CONV (MISCELLANEOUS) ×3 IMPLANT
PAD POSITIONING PINK XL (MISCELLANEOUS) ×3 IMPLANT
PORT ACCESS TROCAR AIRSEAL 12 (TROCAR) ×1 IMPLANT
SCISSORS MNPLR CVD DVNC XI (INSTRUMENTS) ×3 IMPLANT
SCRUB CHG 4% DYNA-HEX 4OZ (MISCELLANEOUS) ×6 IMPLANT
SEAL UNIV 5-12 XI (MISCELLANEOUS) ×12 IMPLANT
SET TRI-LUMEN FLTR TB AIRSEAL (TUBING) ×3 IMPLANT
SPIKE FLUID TRANSFER (MISCELLANEOUS) ×3 IMPLANT
SPONGE T-LAP 18X18 ~~LOC~~+RFID (SPONGE) ×1 IMPLANT
SURGIFLO W/THROMBIN 8M KIT (HEMOSTASIS) IMPLANT
SUT MNCRL AB 4-0 PS2 18 (SUTURE) IMPLANT
SUT PDS AB 1 TP1 54 (SUTURE) ×2 IMPLANT
SUT VIC AB 0 CT1 27 (SUTURE) ×2
SUT VIC AB 0 CT1 27XBRD ANTBC (SUTURE) ×1 IMPLANT
SUT VIC AB 2-0 CT1 27 (SUTURE) ×4
SUT VIC AB 2-0 CT1 TAPERPNT 27 (SUTURE) ×2 IMPLANT
SUT VIC AB 3-0 SH 27 (SUTURE) ×2
SUT VIC AB 3-0 SH 27X BRD (SUTURE) ×1 IMPLANT
SUT VIC AB 4-0 PS2 18 (SUTURE) ×6 IMPLANT
SUT VICRYL 0 27 CT2 27 ABS (SUTURE) IMPLANT
SUT VLOC 180 0 9IN GS21 (SUTURE) IMPLANT
SYR 10ML LL (SYRINGE) IMPLANT
SYS BAG RETRIEVAL 10MM (BASKET)
SYS WOUND ALEXIS 18CM MED (MISCELLANEOUS)
SYSTEM BAG RETRIEVAL 10MM (BASKET) IMPLANT
SYSTEM WOUND ALEXIS 18CM MED (MISCELLANEOUS) IMPLANT
TOWEL OR NON WOVEN STRL DISP B (DISPOSABLE) IMPLANT
TRAP SPECIMEN MUCUS 40CC (MISCELLANEOUS) IMPLANT
TRAY FOLEY MTR SLVR 16FR STAT (SET/KITS/TRAYS/PACK) ×3 IMPLANT
TROCAR PORT AIRSEAL 5X120 (TROCAR) IMPLANT
UNDERPAD 30X36 HEAVY ABSORB (UNDERPADS AND DIAPERS) ×6 IMPLANT
WATER STERILE IRR 1000ML POUR (IV SOLUTION) ×3 IMPLANT
YANKAUER SUCT BULB TIP 10FT TU (MISCELLANEOUS) ×1 IMPLANT

## 2023-01-11 NOTE — Discharge Instructions (Addendum)
AFTER SURGERY INSTRUCTIONS   Return to work: 6 weeks if applicable  Dr. Alvester Morin made a slightly larger incision on your abdomen to remove the uterus/tubes/ovaries through since it would not fit through the vagina.   Your post-op appointment was adjusted to one week later to 7/22.   Activity: 1. Be up and out of the bed during the day.  Take a nap if needed.  You may walk up steps but be careful and use the hand rail.  Stair climbing will tire you more than you think, you may need to stop part way and rest.    2. No lifting or straining for 6 weeks over 10 pounds. No pushing, pulling, straining for 6 weeks.   3. No driving for 1 week(s).  Do not drive if you are taking narcotic pain medicine and make sure that your reaction time has returned.    4. You can shower as soon as the next day after surgery. Shower daily.  Use your regular soap and water (not directly on the incision) and pat your incision(s) dry afterwards; don't rub.  No tub baths or submerging your body in water until cleared by your surgeon. If you have the soap that was given to you by pre-surgical testing that was used before surgery, you do not need to use it afterwards because this can irritate your incisions.    5. No sexual activity and nothing in the vagina for 12 weeks.   6. You may experience a small amount of clear drainage from your incisions, which is normal.  If the drainage persists, increases, or changes color please call the office.   7. Do not use creams, lotions, or ointments such as neosporin on your incisions after surgery until advised by your surgeon because they can cause removal of the dermabond glue on your incisions.     8. You may experience vaginal spotting after surgery or around the 6-8 week mark from surgery when the stitches at the top of the vagina begin to dissolve.  The spotting is normal but if you experience heavy bleeding, call our office.   9. Take Tylenol or ibuprofen first for pain if you  are able to take these medications and only use narcotic pain medication for severe pain not relieved by the Tylenol or Ibuprofen.  Monitor your Tylenol intake to a max of 4,000 mg in a 24 hour period. You can alternate these medications after surgery.   Diet: 1. Low sodium Heart Healthy Diet is recommended but you are cleared to resume your normal (before surgery) diet after your procedure.   2. It is safe to use a laxative, such as Miralax or Colace, if you have difficulty moving your bowels. You have been prescribed Sennakot-S to take at bedtime every evening after surgery to keep bowel movements regular and to prevent constipation.     Wound Care: 1. Keep clean and dry.  Shower daily.   Reasons to call the Doctor: Fever - Oral temperature greater than 100.4 degrees Fahrenheit Foul-smelling vaginal discharge Difficulty urinating Nausea and vomiting Increased pain at the site of the incision that is unrelieved with pain medicine. Difficulty breathing with or without chest pain New calf pain especially if only on one side Sudden, continuing increased vaginal bleeding with or without clots.   Contacts: For questions or concerns you should contact:   Dr. Clide Cliff at [phone]   Warner Mccreedy, NP at [phone]   After Hours: call [phone] and have the GYN  Oncologist paged/contacted (after 5 pm or on the weekends). You will speak with an after hours RN and let he or she know you have had surgery.   Messages sent via mychart are for non-urgent matters and are not responded to after hours so for urgent needs, please call the after hours number.

## 2023-01-11 NOTE — Op Note (Addendum)
GYNECOLOGIC ONCOLOGY OPERATIVE NOTE  Date of Service: 01/11/2023  Preoperative Diagnosis: EIN, morbid obesity (BMI 54.94)  Postoperative Diagnosis: Same  Procedures: Robotic-assisted total laparoscopic hysterectomy (>250g) bilateral salpingo-oophorectomy, bilateral sentinel lymph node evaluation and biopsy, minilaparotomy for specimen removal  Surgeon: Clide Cliff, MD  Assistants: Warner Mccreedy, NP  Anesthesia: General  Estimated Blood Loss: 40 mL    Fluids: 1300 ml, crystalloid  Urine Output: 250 ml, clear yellow  Findings: Normal upper abdominal survey with normal liver surface and diaphragm. Normal appearing small and large bowel. Globally enlarged uterus. Normal tubes, and ovaries. Filmy adhesions in the posterior cul-de-sac between the rectum and the posterior uterus, right adnexa and posterior cul-de-sac peritoneum. Adhesions of the sigmoid colon to the left pelvic side wall. No evidence of peritoneal disease, ascites, or carcinomatosis. Sentinel mapping on right to the right obturator space; sentinel mapping on left to the left external iliac vein, prominent node.   Specimens:  ID Type Source Tests Collected by Time Destination  1 : Right obturator sentinel lymph node Tissue PATH Sentinel Lymph Node SURGICAL PATHOLOGY Clide Cliff, MD 01/11/2023 1219   2 : left external iliac sentinel lymph node Tissue PATH Sentinel Lymph Node SURGICAL PATHOLOGY Clide Cliff, MD 01/11/2023 1237   3 : uterus, cervix, bilateral tubes and ovaries Tissue PATH Gyn biopsy SURGICAL PATHOLOGY Clide Cliff, MD 01/11/2023 1309     Complications:  None  Indications for Procedure: Renee Hahn is a 52 y.o. woman win EIN.  Prior to the procedure, all risks, benefits, and alternatives were discussed and informed surgical consent was signed.  Procedure: Patient was taken to the operating room where general anesthesia was achieved.  She was positioned in dorsal lithotomy and prepped and  draped.  A foley catheter was inserted into the bladder. 1 ml of dilute Indo-Cyanine dye was was injected at 1cm and 1mm deep at 3 and 9 o'clock in the cervical stroma.  The cervix was dilated and an Advincula uterine manipulator with a colpotomy ring was inserted into the uterus.  A 10 mm incision was made in the left upper quadrant near Palmer's point.  The abdomen was entered with a 5 mm OptiView trocar under direct visualization.  The abdomen was insufflated, the patient placed in steep Trendelenburg, and additional trocars were placed as follows: an 8mm trocar superior to the umbilicus, two 8 mm robotic trocars in the right abdomen, and one 8 mm robotic trocar in the left abdomen.  The left upper quadrant trocar was removed and replaced with a 12 mm airseal trocar.  All trocars were placed under direct visualization.  The bowels were moved into the upper abdomen.  The DaVinci robotic surgical system was brought to the patient's bedside and docked.  Filmy adhesions in the posterior cul-de-sac to the posterior uterus and the right adnexa were lysed with scissors. The right round ligament was transected and the retroperitoneum entered.The right ureter was identified. The paravesical and pararectal spaces were opened, and the node was found to be located in the obturator space. A sentinel lymph node dissection was performed taking care to avoid injury to the ureter, superior vesicle artery or obturator nerve. A similar procedure was performed on left with the sentinel lymph node identified on the left external iliac vein. The sentinel lymph nodes mentioned above were identified and removed through the assistant trocar.  The right ureter was again identified, and the right infundibulopelvic ligament was isolated, cauterized, and transected. The posterior peritoneum was opened to the colpotomy  ring. Additional filmy adhesion in the posterior cul-de-sac were lysed sharply. The anterior peritoneum was opened and  the bladder flap was created. The right uterine artery was skeletonized, cauterized, and transected at the level of the colpotomy ring. Additional cautery was used in a C-shaped fashion to allow the remainder of the broad, cardinal, and uterosacral ligaments with the uterine vessels to be transected and fall away from the colpotomy ring.  A similar procedure was performed on the contralateral side. A colpotomy was made circumferentially following the contours of the colpotomy ring.  The uterine specimen was unable to be removed through the vagina. An endocatch bag was introduced through the vagina. The uterine specimen was placed in the endocatch bag and moved into the upper abdomen.  The vaginal cuff was closed with a running stitch of 0 Vicryl suture.  The pelvis was irrigated and all operative sites were found to be hemostatic.  All instruments were removed and the robot was taken from the patient's bedside. The string of the endocatch bag was brought up through the supraumbilical trocar. The fascia at the 12 mm incision was closed with 0 Vicryl using a PMI device.   The supraumbilical trocar was removed. A vertical midline incision was made with the scalpel extending cephalad from the laparoscopic supraumbilical incision, and the abdomen was entered sharply.  The endocatch bag with the uterine specimen was then removed from the abdomen. The abdomen was desufflated and all ports were removed.   The fascia was closed with a running stitch#1 PDS. Exparel was injected at the incision site in standard fashion. The subcutaneous tissues were irrigated and hemostasis achieved. The subcutaneous space was approximated with two layers of 2-0 vicryl to reapproximate the subcutaneous tissue and a running deep dermal stitch of 3-0 vicryl. The skin was closed with 4-0 monocryl in a subcuticular fashion followed by surgical glue.  The skin at all the laparoscopic incisions was closed with 4-0 Vicryl to reapproximate the  subcutaneous tissue and 4-0 monocryl in a subcuticular fashion followed by surgical glue.  Patient tolerated the procedure well. Sponge, lap, and instrument counts were correct.  Patient received 3 gm of Ancef and 500mg  of metronidazole prior to skin incision for routine perioperative antibiotic prophylaxis.  She was extubated and taken to the PACU in stable condition.  Clide Cliff, MD Gynecologic Oncology

## 2023-01-11 NOTE — Transfer of Care (Signed)
Immediate Anesthesia Transfer of Care Note  Patient: Renee Hahn  Procedure(s) Performed: XI ROBOTIC ASSISTED TOTAL HYSTERECTOMY >250g WITH BILATERAL SALPINGO OOPHORECTOMY, MINI LAPAROTOMY (Abdomen) SENTINEL NODE BIOPSY (Abdomen)  Patient Location: PACU  Anesthesia Type:General  Level of Consciousness: drowsy, patient cooperative, and responds to stimulation  Airway & Oxygen Therapy: Patient Spontanous Breathing and Patient connected to face mask oxygen  Post-op Assessment: Report given to RN and Post -op Vital signs reviewed and stable  Post vital signs: Reviewed and stable  Last Vitals:  Vitals Value Taken Time  BP 140/93 01/11/23 1508  Temp    Pulse 102 01/11/23 1510  Resp 21 01/11/23 1510  SpO2 100 % 01/11/23 1510  Vitals shown include unvalidated device data.  Last Pain:  Vitals:   01/11/23 0835  TempSrc: Oral  PainSc:          Complications: No notable events documented.

## 2023-01-11 NOTE — Anesthesia Procedure Notes (Addendum)
Procedure Name: Intubation Date/Time: 01/11/2023 10:56 AM  Performed by: Maurene Capes, CRNAPre-anesthesia Checklist: Patient identified, Emergency Drugs available, Suction available and Patient being monitored Patient Re-evaluated:Patient Re-evaluated prior to induction Oxygen Delivery Method: Circle System Utilized Preoxygenation: Pre-oxygenation with 100% oxygen Induction Type: IV induction Ventilation: Mask ventilation without difficulty Laryngoscope Size: Mac and 4 Grade View: Grade II Tube type: Oral Tube size: 7.5 mm Number of attempts: 1 Airway Equipment and Method: Stylet Placement Confirmation: ETT inserted through vocal cords under direct vision, positive ETCO2 and breath sounds checked- equal and bilateral Secured at: 20.5 cm Tube secured with: Tape Dental Injury: Teeth and Oropharynx as per pre-operative assessment

## 2023-01-11 NOTE — Interval H&P Note (Signed)
History and Physical Interval Note:  01/11/2023 9:50 AM  Renee Hahn  has presented today for surgery, with the diagnosis of COMPLEX ATYPICAL ENDOMETRIAL HYPERPLASIA.  The various methods of treatment have been discussed with the patient and family. After consideration of risks, benefits and other options for treatment, the patient has consented to  Procedure(s): XI ROBOTIC ASSISTED TOTAL HYSTERECTOMY WITH BILATERAL SALPINGO OOPHORECTOMY, POSSIBLE MINI LAPAROTOMY (N/A) SENTINEL NODE BIOPSY (N/A) DILATATION AND CURETTAGE (N/A) POSSIBLE INTRAUTERINE DEVICE (IUD) INSERTION (N/A) as a surgical intervention.  The patient's history has been reviewed, patient examined, no change in status, stable for surgery.  I have reviewed the patient's chart and labs.  Questions were answered to the patient's satisfaction.     Khalil Belote

## 2023-01-12 ENCOUNTER — Encounter (HOSPITAL_COMMUNITY): Payer: Self-pay | Admitting: Psychiatry

## 2023-01-12 ENCOUNTER — Telehealth: Payer: Self-pay

## 2023-01-12 ENCOUNTER — Encounter: Payer: Self-pay | Admitting: *Deleted

## 2023-01-12 ENCOUNTER — Telehealth: Payer: Self-pay | Admitting: *Deleted

## 2023-01-12 NOTE — Anesthesia Postprocedure Evaluation (Signed)
Anesthesia Post Note  Patient: ODESSA NISHI  Procedure(s) Performed: XI ROBOTIC ASSISTED TOTAL HYSTERECTOMY >250g WITH BILATERAL SALPINGO OOPHORECTOMY, MINI LAPAROTOMY (Abdomen) SENTINEL NODE BIOPSY (Abdomen)     Patient location during evaluation: PACU Anesthesia Type: General Level of consciousness: awake Pain management: pain level controlled Vital Signs Assessment: post-procedure vital signs reviewed and stable Respiratory status: spontaneous breathing, nonlabored ventilation and respiratory function stable Cardiovascular status: blood pressure returned to baseline and stable Postop Assessment: no apparent nausea or vomiting Anesthetic complications: no   No notable events documented.  Last Vitals:  Vitals:   01/11/23 1545 01/11/23 1637  BP: (!) 128/92 (!) 146/98  Pulse: 86 96  Resp: 17   Temp: 36.6 C 36.6 C  SpO2: 98% 93%    Last Pain:  Vitals:   01/11/23 1637  TempSrc:   PainSc: 0-No pain   Pain Goal:                   Linton Rump

## 2023-01-12 NOTE — Telephone Encounter (Signed)
Spoke with Renee Hahn this morning. She states she is eating, drinking and urinating well. She has not had a BM yet but is passing gas. She is taking senokot as prescribed and encouraged her to drink plenty of water. She denies fever or chills. Incisions are dry and intact. She rates her pain 6/10. Her pain is controlled with tylenol only.    Instructed to call office with any fever, chills, purulent drainage, uncontrolled pain or any other questions or concerns. Patient verbalizes understanding.   Pt aware of post op appointments as well as the office number [phone] and after hours number [phone] to call if she has any questions or concerns

## 2023-01-12 NOTE — Transitions of Care (Post Inpatient/ED Visit) (Signed)
   01/12/2023  Name: NYRA ANSPAUGH MRN: [id number] DOB: 07-Feb-1971  Today's TOC FU Call Status:    Transition Care Management Follow-up Telephone Call Discharge Facility: [facility]) Type of Discharge: Inpatient Admission Primary Inpatient Discharge Diagnosis:: total laparoscopic hysterectomy How have you been since you were released from the hospital?: Better Any questions or concerns?: No  Items Reviewed: Did you receive and understand the discharge instructions provided?: Yes Medications obtained,verified, and reconciled?: Yes (Medications Reviewed) Any new allergies since your discharge?: No Dietary orders reviewed?: NA Do you have support at home?: Yes People in Home: sibling(s), other relative(s)  Medications Reviewed Today: Medications Reviewed Today     Reviewed by Olga Coaster, CMA (Certified Medical Assistant) on 01/12/23 at 1323  Med List Status: <None>   Medication Order Taking? Sig Documenting Provider Last Dose Status Informant  acetaminophen (TYLENOL) 650 MG CR tablet [id number]  Take 650-1,300 mg by mouth every 8 (eight) hours as needed for pain. [provider]  Active Self  aspirin EC 81 MG tablet [id number]  Take 1 tablet (81 mg total) by mouth daily. Swallow whole. Anne Ng, NP  Active Self  atorvastatin (LIPITOR) 80 MG tablet [id number]  TAKE 1 TABLET BY MOUTH EVERYDAY AT BEDTIME Nche, Bonna Gains, NP  Active Self  ferrous gluconate (FERGON) 324 MG tablet [id number]  Take 324 mg by mouth 2 (two) times daily with a meal. [provider]  Active Self  ibuprofen (ADVIL) 800 MG tablet [id number] Yes Take 1 tablet (800 mg total) by mouth every 8 (eight) hours as needed. For AFTER surgery only Warner Mccreedy D, NP Taking Active   oxyCODONE (OXY IR/ROXICODONE) 5 MG immediate release tablet [id number] Yes Take 1 tablet (5 mg total) by mouth every 4 (four) hours as needed for severe pain. For AFTER surgery, do not take and drive Cross,  Melissa D, NP Taking Active   senna-docusate (SENOKOT-S) 8.6-50 MG tablet [id number]  Take 2 tablets by mouth at bedtime. For AFTER surgery. Do not take if having diarrhea. Warner Mccreedy D, NP  Active   trolamine salicylate (ASPERCREME) 10 % cream [id number]  Apply 1 Application topically as needed for muscle pain. [provider]  Active Self            Home Care and Equipment/Supplies:    Functional Questionnaire:    Follow up appointments reviewed: Specialist Hospital Follow-up appointment confirmed?: Yes Date of Specialist follow-up appointment?: 01/24/23 Follow-Up Specialty Provider:: Surgeon Do you need transportation to your follow-up appointment?: No   SIGNATURE: Cashis Rill CMA

## 2023-01-13 LAB — SURGICAL PATHOLOGY

## 2023-01-17 ENCOUNTER — Encounter: Payer: No Typology Code available for payment source | Admitting: Psychiatry

## 2023-01-18 NOTE — Progress Notes (Signed)
Anesthesia Review:  PCP: Alysia Penna LOV 08/16/22  Cardiologist : DR Peter [place] LOV 01/04/23  Chest x-ray : EKG : 01/04/23  Echo : Stress test: Cardiac Cath :  Activity level:  Sleep Study/ CPAP : Fasting Blood Sugar :      / Checks Blood Sugar -- times a day:   Blood Thinner/ Instructions /Last Dose: ASA / Instructions/ Last Dose :    Hx of stroke with no deficits  Hysterectomy- 01/11/23  PT has bowel prep instrucitons per pt .

## 2023-01-24 ENCOUNTER — Encounter: Payer: Self-pay | Admitting: Psychiatry

## 2023-01-24 ENCOUNTER — Inpatient Hospital Stay: Payer: No Typology Code available for payment source | Attending: Psychiatry | Admitting: Psychiatry

## 2023-01-24 VITALS — BP 140/82 | HR 68 | Temp 98.4°F | Resp 18 | Wt 276.0 lb

## 2023-01-24 DIAGNOSIS — Z90722 Acquired absence of ovaries, bilateral: Secondary | ICD-10-CM

## 2023-01-24 DIAGNOSIS — N8502 Endometrial intraepithelial neoplasia [EIN]: Secondary | ICD-10-CM

## 2023-01-24 DIAGNOSIS — Z90710 Acquired absence of both cervix and uterus: Secondary | ICD-10-CM

## 2023-01-24 NOTE — Progress Notes (Signed)
Gynecologic Oncology Return Clinic Visit  Date of Service: 01/24/2023 Referring Provider: Gerald Leitz, MD   Assessment & Plan: Renee Hahn is a 52 y.o. woman with EIN who is s/p RA-TLH (>250g), BSO, blt SLNBx, minilap for specimen retrieval on 01/11/23, only residual complex hyperplasia without atypia on final specimen.  Postop: - Pt recovering well from surgery and healing appropriately postoperatively - Intraoperative findings and pathology results reviewed. - Ongoing postoperative expectations and precautions reviewed. Continue with no lifting >10lbs through 6 weeks postoperatively - Okay to return to work at 6 weeks. - Per report, no high grade cervical dyspalsia; no further pap smears indicated. - Okay to return to routine ob/gyn care.   RTC prn.  Clide Cliff, MD Gynecologic Oncology    ----------------------- Reason for Visit: Postop    Interval History: Pt reports that she is recovering well from surgery. She is using tylenol and motrin as needed for pain. She is eating and drinking well. She is voiding without issue and having regular bowel movements. No bleeding. Had one possible hot flash but not having frequent symptoms.   Past Medical/Surgical History: Past Medical History:  Diagnosis Date   Cholelithiasis    Hay fever    Headache    past   Hyperlipidemia    Iron deficiency anemia    Prediabetes    Stroke (HCC)    Vitamin B12 deficiency 02/27/2021    Past Surgical History:  Procedure Laterality Date   BREAST BIOPSY     BREAST EXCISIONAL BIOPSY Left 12/03/2021   benign   BREAST LUMPECTOMY WITH RADIOACTIVE SEED LOCALIZATION Left 12/03/2021   Procedure: LEFT BREAST LUMPECTOMY WITH RADIOACTIVE SEED LOCALIZATION;  Surgeon: Harriette Bouillon, MD;  Location: [facility] OR;  Service: General;  Laterality: Left;   CHOLECYSTECTOMY N/A 09/21/2019   Procedure: LAPAROSCOPIC CHOLECYSTECTOMY WITH INTRAOPERATIVE CHOLANGIOGRAM;  Surgeon: Harriette Bouillon, MD;  Location:  [facility] ORS;  Service: General;  Laterality: N/A;   DILITATION & CURRETTAGE/HYSTROSCOPY WITH HYDROTHERMAL ABLATION N/A 11/17/2022   Procedure: DILATATION & CURETTAGE/HYSTEROSCOPY WITH HYDROTHERMAL ABLATION;  Surgeon: Gerald Leitz, MD;  Location: [facility] OR;  Service: Gynecology;  Laterality: N/A;   ROBOTIC ASSISTED TOTAL HYSTERECTOMY WITH BILATERAL SALPINGO OOPHERECTOMY N/A 01/11/2023   Procedure: XI ROBOTIC ASSISTED TOTAL HYSTERECTOMY >250g WITH BILATERAL SALPINGO OOPHORECTOMY, MINI LAPAROTOMY;  Surgeon: Clide Cliff, MD;  Location: [facility] ORS;  Service: Gynecology;  Laterality: N/A;   SENTINEL NODE BIOPSY N/A 01/11/2023   Procedure: SENTINEL NODE BIOPSY;  Surgeon: Clide Cliff, MD;  Location: [facility] ORS;  Service: Gynecology;  Laterality: N/A;   THROMBECTOMY  2022    Family History  Problem Relation Age of Onset   Breast cancer Mother    Prostate cancer Mother    Prostate cancer Father    Hypertension Maternal Grandmother    Hypertension Paternal Grandfather    Endometrial cancer Neg Hx    Ovarian cancer Neg Hx    Colon cancer Neg Hx     Social History   Socioeconomic History   Marital status: Widowed    Spouse name: Not on file   Number of children: 0   Years of education: Not on file   Highest education level: Not on file  Occupational History   Not on file  Tobacco Use   Smoking status: Never   Smokeless tobacco: Never  Vaping Use   Vaping status: Never Used  Substance and Sexual Activity   Alcohol use: Yes    Comment: socially   Drug use: No   Sexual activity: Not  Currently    Partners: Male  Other Topics Concern   Not on file  Social History Narrative   Works for claims at [employer] Strain: Medium Risk (03/03/2021)   Received from [employer], [facility]   Overall Financial Resource Strain (CARDIA)    Difficulty of Paying Living Expenses: Somewhat hard  Food Insecurity: Food Insecurity Present (03/03/2021)   Received  from [facility], [facility]   Hunger Vital Sign    Worried About Running Out of Food in the Last Year: Sometimes true    Ran Out of Food in the Last Year: Sometimes true  Transportation Needs: No Transportation Needs (03/03/2021)   Received from [employer], [facility]   PRAPARE - Transportation    Lack of Transportation (Medical): No    Lack of Transportation (Non-Medical): No  Physical Activity: Inactive (03/03/2021)   Received from [facility], [facility]   Exercise Vital Sign    Days of Exercise per Week: 0 days    Minutes of Exercise per Session: 30 min  Stress: Stress Concern Present (03/03/2021)   Received from [facility], [facility] of Occupational Health - Occupational Stress Questionnaire    Feeling of Stress : Very much  Social Connections: Unknown (11/13/2021)   Received from [facility], [facility]   Social Network    Social Network: Not on file    Current Medications:  Current Outpatient Medications:    acetaminophen (TYLENOL) 650 MG CR tablet, Take 650-1,300 mg by mouth every 8 (eight) hours as needed for pain., Disp: , Rfl:    aspirin EC 81 MG tablet, Take 1 tablet (81 mg total) by mouth daily. Swallow whole., Disp: 90 tablet, Rfl: 3   atorvastatin (LIPITOR) 80 MG tablet, TAKE 1 TABLET BY MOUTH EVERYDAY AT BEDTIME, Disp: 90 tablet, Rfl: 2   ferrous gluconate (FERGON) 324 MG tablet, Take 324 mg by mouth 2 (two) times daily with a meal., Disp: , Rfl:    ibuprofen (ADVIL) 800 MG tablet, Take 1 tablet (800 mg total) by mouth every 8 (eight) hours as needed. For AFTER surgery only, Disp: 30 tablet, Rfl: 0   senna-docusate (SENOKOT-S) 8.6-50 MG tablet, Take 2 tablets by mouth at bedtime. For AFTER surgery. Do not take if having diarrhea., Disp: 30 tablet, Rfl: 0   Sodium Fluoride (FLUORIDEX DAILY DEFENSE DT), , Disp: , Rfl:   Review of Symptoms: Complete 10-system review is negative except as above in Interval  History.  Physical Exam: BP (!) 140/82 Comment: Manual MD and CAM notified  Pulse 68   Temp 98.4 F (36.9 C) (Oral)   Resp 18   Wt 276 lb (125.2 kg)   SpO2 99% Comment: RA  BMI 55.75 kg/m  General: Alert, oriented, no acute distress. HEENT: Normocephalic, atraumatic. Neck symmetric without masses. Sclera anicteric.  Chest: Normal work of breathing. Clear to auscultation bilaterally.   Cardiovascular: Regular rate and rhythm, no murmurs. Abdomen: Soft, nontender.  Normoactive bowel sounds.  No masses appreciated.  Well-healing incisions. Some bruising around supraumbilical minilap site. No drainage or redness. Extremities: Grossly normal range of motion.  Warm, well perfused.  No edema bilaterally. Skin: No rashes or lesions noted. GU: Normal appearing external genitalia without erythema, excoriation, or lesions.  Speculum exam reveals intact, well healing vaginal cuff.  Bimanual exam reveals intact vaginal cuff. Exam chaperoned by Andrey Cota, RN   Laboratory & Radiologic Studies: FINAL MICROSCOPIC DIAGNOSIS:  A. SENTINEL LYMPH NODE, RIGHT OBTURATOR, EXCISION:      One lymph node, negative for metastatic carcinoma (0/1).  B. SENTINEL LYMPH NODE, LEFT EXTERNAL ILIAC, EXCISION:      One lymph node, negative for metastatic carcinoma (0/1).  C. UTERUS, CERVIX, BILATERAL FALLOPIAN TUBES AND OVARIES: Cervix:           Unremarkable.           Negative for dysplasia or malignancy.       Endocervix:           Nabothian cysts.           Negative for hyperplasia, atypia or malignancy.       Endometrium:           Focal endometrial complex hyperplasia without atypia.           No evidence of carcinoma or malignancy.           Negative for hyperplasia, atypia or malignancy.       Myometrium:           Leiomyomata (1.4 - 1.6 cm).           Negative for malignancy.       Serosa:           Unremarkable.           Negative for malignancy.       Bilateral fallopian tubes:            Benign fimbriated fallopian tubes.           Negative for malignancy.       Bilateral ovaries:           Benign bilateral ovaries with inclusion cysts.           Negative for malignancy.

## 2023-01-24 NOTE — Patient Instructions (Signed)
It was a pleasure to see you in clinic today. - healing well. - No lifting >10lbs until 6 wks postop. No baths/pool/ocean until 6 wks postop. Nothing in the vagina until 8 weeks postop.  Thank you very much for allowing me to provide care for you today.  I appreciate your confidence in choosing our Gynecologic Oncology team at [facility].  If you have any questions about your visit today please call our office or send [name] a MyChart message and we will get back to you as soon as possible.

## 2023-01-26 ENCOUNTER — Telehealth: Payer: Self-pay | Admitting: Gastroenterology

## 2023-01-26 NOTE — Telephone Encounter (Signed)
Ok

## 2023-01-26 NOTE — Telephone Encounter (Signed)
Spoke with the patient. She no longer has transportation to her colonoscopy tomorrow. We tried moving it to later in the morning. Her brother was to be her care partner. He has been told by his employer that must work tomorrow.  Patient was scheduled for a screening colonoscopy at [facility]. Patient is now on the waiting list.

## 2023-01-26 NOTE — Telephone Encounter (Signed)
PT is calling to reschedule colonoscopy at [facility] on 7/25. She does not have a care partner. She cannot do the procedure after 10/16. Please advise

## 2023-01-27 ENCOUNTER — Ambulatory Visit (HOSPITAL_COMMUNITY)
Admission: RE | Admit: 2023-01-27 | Payer: No Typology Code available for payment source | Source: Home / Self Care | Admitting: Gastroenterology

## 2023-01-27 SURGERY — COLONOSCOPY WITH PROPOFOL
Anesthesia: Monitor Anesthesia Care

## 2023-03-15 ENCOUNTER — Encounter: Payer: Self-pay | Admitting: Physician Assistant

## 2023-04-08 ENCOUNTER — Other Ambulatory Visit: Payer: Self-pay | Admitting: Nurse Practitioner

## 2023-04-08 DIAGNOSIS — Z1212 Encounter for screening for malignant neoplasm of rectum: Secondary | ICD-10-CM

## 2023-04-08 DIAGNOSIS — Z1211 Encounter for screening for malignant neoplasm of colon: Secondary | ICD-10-CM

## 2023-04-25 ENCOUNTER — Encounter: Payer: Self-pay | Admitting: Nurse Practitioner

## 2023-05-31 ENCOUNTER — Other Ambulatory Visit: Payer: Self-pay | Admitting: Nurse Practitioner

## 2023-06-16 ENCOUNTER — Ambulatory Visit: Payer: No Typology Code available for payment source | Admitting: Nurse Practitioner

## 2023-06-16 ENCOUNTER — Encounter: Payer: Self-pay | Admitting: Nurse Practitioner

## 2023-06-16 VITALS — BP 124/87 | HR 92 | Temp 98.3°F | Resp 18 | Ht 59.0 in | Wt 274.2 lb

## 2023-06-16 DIAGNOSIS — Z6841 Body Mass Index (BMI) 40.0 and over, adult: Secondary | ICD-10-CM

## 2023-06-16 DIAGNOSIS — E782 Mixed hyperlipidemia: Secondary | ICD-10-CM | POA: Diagnosis not present

## 2023-06-16 DIAGNOSIS — Z0001 Encounter for general adult medical examination with abnormal findings: Secondary | ICD-10-CM

## 2023-06-16 DIAGNOSIS — R7303 Prediabetes: Secondary | ICD-10-CM

## 2023-06-16 DIAGNOSIS — D500 Iron deficiency anemia secondary to blood loss (chronic): Secondary | ICD-10-CM

## 2023-06-16 DIAGNOSIS — N8502 Endometrial intraepithelial neoplasia [EIN]: Secondary | ICD-10-CM | POA: Diagnosis not present

## 2023-06-16 DIAGNOSIS — Z1211 Encounter for screening for malignant neoplasm of colon: Secondary | ICD-10-CM

## 2023-06-16 DIAGNOSIS — I6930 Unspecified sequelae of cerebral infarction: Secondary | ICD-10-CM | POA: Diagnosis not present

## 2023-06-16 DIAGNOSIS — Z0000 Encounter for general adult medical examination without abnormal findings: Secondary | ICD-10-CM

## 2023-06-16 LAB — LIPID PANEL
Cholesterol: 160 mg/dL (ref 0–200)
HDL: 33.3 mg/dL — ABNORMAL LOW (ref >39.00)
LDL Cholesterol: 104 mg/dL — ABNORMAL HIGH (ref 0–99)
NonHDL: 127.18
Total CHOL/HDL Ratio: 5
Triglycerides: 116 mg/dL (ref 0.0–149.0)
VLDL: 23.2 mg/dL (ref 0.0–40.0)

## 2023-06-16 LAB — CBC WITH DIFFERENTIAL/PLATELET
Basophils Absolute: 0.1 10*3/uL (ref 0.0–0.1)
Basophils Relative: 0.6 % (ref 0.0–3.0)
Eosinophils Absolute: 0.1 10*3/uL (ref 0.0–0.7)
Eosinophils Relative: 1.1 % (ref 0.0–5.0)
HCT: 39.5 % (ref 36.0–46.0)
Hemoglobin: 12.8 g/dL (ref 12.0–15.0)
Lymphocytes Relative: 17.8 % (ref 12.0–46.0)
Lymphs Abs: 1.6 10*3/uL (ref 0.7–4.0)
MCHC: 32.3 g/dL (ref 30.0–36.0)
MCV: 76.2 fL — ABNORMAL LOW (ref 78.0–100.0)
Monocytes Absolute: 0.4 10*3/uL (ref 0.1–1.0)
Monocytes Relative: 5 % (ref 3.0–12.0)
Neutro Abs: 6.7 10*3/uL (ref 1.4–7.7)
Neutrophils Relative %: 75.5 % (ref 43.0–77.0)
Platelets: 285 10*3/uL (ref 150.0–400.0)
RBC: 5.18 Mil/uL — ABNORMAL HIGH (ref 3.87–5.11)
RDW: 17.3 % — ABNORMAL HIGH (ref 11.5–15.5)
WBC: 8.9 10*3/uL (ref 4.0–10.5)

## 2023-06-16 LAB — COMPREHENSIVE METABOLIC PANEL
ALT: 14 U/L (ref 0–35)
AST: 28 U/L (ref 0–37)
Albumin: 3.7 g/dL (ref 3.5–5.2)
Alkaline Phosphatase: 94 U/L (ref 39–117)
BUN: 13 mg/dL (ref 6–23)
CO2: 31 meq/L (ref 19–32)
Calcium: 9.4 mg/dL (ref 8.4–10.5)
Chloride: 102 meq/L (ref 96–112)
Creatinine, Ser: 0.94 mg/dL (ref 0.40–1.20)
GFR: 70.69 mL/min (ref >60.00)
Glucose, Bld: 88 mg/dL (ref 70–99)
Potassium: 4.5 meq/L (ref 3.5–5.1)
Sodium: 142 meq/L (ref 135–145)
Total Bilirubin: 0.9 mg/dL (ref 0.2–1.2)
Total Protein: 6.9 g/dL (ref 6.0–8.3)

## 2023-06-16 LAB — HEMOGLOBIN A1C: Hgb A1c MFr Bld: 6.1 % (ref 4.6–6.5)

## 2023-06-16 LAB — IBC + FERRITIN
Ferritin: 24.3 ng/mL (ref 10.0–291.0)
Iron: 89 ug/dL (ref 42–145)
Saturation Ratios: 22.7 % (ref 20.0–50.0)
TIBC: 392 ug/dL (ref 250.0–450.0)
Transferrin: 280 mg/dL (ref 212.0–360.0)

## 2023-06-16 MED ORDER — ATORVASTATIN CALCIUM 80 MG PO TABS: 80.0000 mg | ORAL_TABLET | Freq: Every day | ORAL | 3 refills | Status: DC

## 2023-06-16 NOTE — Assessment & Plan Note (Addendum)
S/p hysterectomy with oophrectomy Per GYN oncology: Per report, no high grade cervical dyspalsia; no further pap smears indicated.

## 2023-06-16 NOTE — Patient Instructions (Signed)
Go to lab Continue Heart healthy diet and daily exercise. Maintain current medications. Schedule appointment with GI for colonoscopy  Preventive Care 24-50 Years Old, Female Preventive care refers to lifestyle choices and visits with your health care provider that can promote health and wellness. Preventive care visits are also called wellness exams. What can I expect for my preventive care visit? Counseling Your health care provider may ask you questions about your: Medical history, including: Past medical problems. Family medical history. Pregnancy history. Current health, including: Menstrual cycle. Method of birth control. Emotional well-being. Home life and relationship well-being. Sexual activity and sexual health. Lifestyle, including: Alcohol, nicotine or tobacco, and drug use. Access to firearms. Diet, exercise, and sleep habits. Work and work Astronomer. Sunscreen use. Safety issues such as seatbelt and bike helmet use. Physical exam Your health care provider will check your: Height and weight. These may be used to calculate your BMI (body mass index). BMI is a measurement that tells if you are at a healthy weight. Waist circumference. This measures the distance around your waistline. This measurement also tells if you are at a healthy weight and may help predict your risk of certain diseases, such as type 2 diabetes and high blood pressure. Heart rate and blood pressure. Body temperature. Skin for abnormal spots. What immunizations do I need?  Vaccines are usually given at various ages, according to a schedule. Your health care provider will recommend vaccines for you based on your age, medical history, and lifestyle or other factors, such as travel or where you work. What tests do I need? Screening Your health care provider may recommend screening tests for certain conditions. This may include: Lipid and cholesterol levels. Diabetes screening. This is done by  checking your blood sugar (glucose) after you have not eaten for a while (fasting). Pelvic exam and Pap test. Hepatitis B test. Hepatitis C test. HIV (human immunodeficiency virus) test. STI (sexually transmitted infection) testing, if you are at risk. Lung cancer screening. Colorectal cancer screening. Mammogram. Talk with your health care provider about when you should start having regular mammograms. This may depend on whether you have a family history of breast cancer. BRCA-related cancer screening. This may be done if you have a family history of breast, ovarian, tubal, or peritoneal cancers. Bone density scan. This is done to screen for osteoporosis. Talk with your health care provider about your test results, treatment options, and if necessary, the need for more tests. Follow these instructions at home: Eating and drinking  Eat a diet that includes fresh fruits and vegetables, whole grains, lean protein, and low-fat dairy products. Take vitamin and mineral supplements as recommended by your health care provider. Do not drink alcohol if: Your health care provider tells you not to drink. You are pregnant, may be pregnant, or are planning to become pregnant. If you drink alcohol: Limit how much you have to 0-1 drink a day. Know how much alcohol is in your drink. In the [place] one drink equals one 12 oz bottle of beer (355 mL), one 5 oz glass of wine (148 mL), or one 1 oz glass of hard liquor (44 mL). Lifestyle Brush your teeth every morning and night with fluoride toothpaste. Floss one time each day. Exercise for at least 30 minutes 5 or more days each week. Do not use any products that contain nicotine or tobacco. These products include cigarettes, chewing tobacco, and vaping devices, such as e-cigarettes. If you need help quitting, ask your health care provider. Do  not use drugs. If you are sexually active, practice safe sex. Use a condom or other form of protection to prevent  STIs. If you do not wish to become pregnant, use a form of birth control. If you plan to become pregnant, see your health care provider for a prepregnancy visit. Take aspirin only as told by your health care provider. Make sure that you understand how much to take and what form to take. Work with your health care provider to find out whether it is safe and beneficial for you to take aspirin daily. Find healthy ways to manage stress, such as: Meditation, yoga, or listening to music. Journaling. Talking to a trusted person. Spending time with friends and family. Minimize exposure to UV radiation to reduce your risk of skin cancer. Safety Always wear your seat belt while driving or riding in a vehicle. Do not drive: If you have been drinking alcohol. Do not ride with someone who has been drinking. When you are tired or distracted. While texting. If you have been using any mind-altering substances or drugs. Wear a helmet and other protective equipment during sports activities. If you have firearms in your house, make sure you follow all gun safety procedures. Seek help if you have been physically or sexually abused. What's next? Visit your health care provider once a year for an annual wellness visit. Ask your health care provider how often you should have your eyes and teeth checked. Stay up to date on all vaccines. This information is not intended to replace advice given to you by your health care provider. Make sure you discuss any questions you have with your health care provider. Document Revised: 12/17/2020 Document Reviewed: 12/17/2020 Elsevier Patient Education  2024 [employer].

## 2023-06-16 NOTE — Assessment & Plan Note (Signed)
Repeat hgbA1c 

## 2023-06-16 NOTE — Assessment & Plan Note (Signed)
Mildly elevated DBP, normal SBP: advised to maintain DASH diet and daily exercise Current use of ASA 81mg  and atorvastatin Repeat lipid panel and CMP today

## 2023-06-16 NOTE — Assessment & Plan Note (Signed)
Repeat cbc and iron panel

## 2023-06-16 NOTE — Assessment & Plan Note (Signed)
Encouraged to maintain heart healthy diet and daily exercise. Wt Readings from Last 3 Encounters:  06/16/23 274 lb 3.2 oz (124.4 kg)  01/24/23 276 lb (125.2 kg)  01/11/23 272 lb (123.4 kg)

## 2023-06-16 NOTE — Progress Notes (Signed)
Complete physical exam  Patient: Renee Hahn   DOB: [birth year]   52 y.o. Female  MRN: [id number] Visit Date: 06/16/2023  Subjective:    Chief Complaint  Patient presents with   Annual Exam    PT is due for cologuard and shingles vaccine    Renee Hahn is a 52 y.o. female who presents today for a complete physical exam. She reports consuming a general diet.  Walking occasionally  She generally feels well. She reports sleeping well. She does have additional problems to discuss today.  Vision:Yes Dental:Yes STD Screen:No  Did not get cologuard kit as ordered. She agreed to GI referral for colonoscopy.  BP Readings from Last 3 Encounters:  06/16/23 124/87  01/24/23 (!) 140/82  01/11/23 (!) 146/98   Wt Readings from Last 3 Encounters:  06/16/23 274 lb 3.2 oz (124.4 kg)  01/24/23 276 lb (125.2 kg)  01/11/23 272 lb (123.4 kg)   Most recent fall risk assessment:    06/16/2023    8:23 AM  Fall Risk   Falls in the past year? 0  Number falls in past yr: 0  Injury with Fall? 0  Risk for fall due to : No Fall Risks  Follow up Falls evaluation completed     Depression screen:Yes - No Depression Most recent depression screenings:    06/16/2023    8:49 AM 08/16/2022    2:09 PM  PHQ 2/9 Scores  PHQ - 2 Score 0 0  PHQ- 9 Score 5    HPI  Complex atypical endometrial hyperplasia S/p hysterectomy with oophrectomy Per GYN oncology: Per report, no high grade cervical dyspalsia; no further pap smears indicated.  Morbid obesity (HCC) Encouraged to maintain heart healthy diet and daily exercise. Wt Readings from Last 3 Encounters:  06/16/23 274 lb 3.2 oz (124.4 kg)  01/24/23 276 lb (125.2 kg)  01/11/23 272 lb (123.4 kg)     Iron deficiency anemia due to chronic blood loss Repeat cbc and iron panel  History of cerebrovascular accident (CVA) with residual deficit Mildly elevated DBP, normal SBP: advised to maintain DASH diet and daily exercise Current use of  ASA 81mg  and atorvastatin Repeat lipid panel and CMP today  Prediabetes Repeat hgbA1c  Past Medical History:  Diagnosis Date   BMI 50.0-59.9, adult (HCC) 01/28/2020   Cholelithiasis    Hay fever    Headache    past   Hyperlipidemia    Iron deficiency anemia    Menorrhagia 12/18/2019   Prediabetes    Stroke (HCC)    Vitamin B12 deficiency 02/27/2021   Past Surgical History:  Procedure Laterality Date   BREAST BIOPSY     BREAST EXCISIONAL BIOPSY Left 12/03/2021   benign   BREAST LUMPECTOMY WITH RADIOACTIVE SEED LOCALIZATION Left 12/03/2021   Procedure: LEFT BREAST LUMPECTOMY WITH RADIOACTIVE SEED LOCALIZATION;  Surgeon: Harriette Bouillon, MD;  Location: [facility] OR;  Service: General;  Laterality: Left;   CHOLECYSTECTOMY N/A 09/21/2019   Procedure: LAPAROSCOPIC CHOLECYSTECTOMY WITH INTRAOPERATIVE CHOLANGIOGRAM;  Surgeon: Harriette Bouillon, MD;  Location: [facility] ORS;  Service: General;  Laterality: N/A;   DILITATION & CURRETTAGE/HYSTROSCOPY WITH HYDROTHERMAL ABLATION N/A 11/17/2022   Procedure: DILATATION & CURETTAGE/HYSTEROSCOPY WITH HYDROTHERMAL ABLATION;  Surgeon: Gerald Leitz, MD;  Location: [facility] OR;  Service: Gynecology;  Laterality: N/A;   ROBOTIC ASSISTED TOTAL HYSTERECTOMY WITH BILATERAL SALPINGO OOPHERECTOMY N/A 01/11/2023   Procedure: XI ROBOTIC ASSISTED TOTAL HYSTERECTOMY >250g WITH BILATERAL SALPINGO OOPHORECTOMY, MINI LAPAROTOMY;  Surgeon: Clide Cliff, MD;  Location: [facility];  Service: Gynecology;  Laterality: N/A;   SENTINEL NODE BIOPSY N/A 01/11/2023   Procedure: SENTINEL NODE BIOPSY;  Surgeon: Clide Cliff, MD;  Location: [facility] ORS;  Service: Gynecology;  Laterality: N/A;   THROMBECTOMY  2022   Social History   Socioeconomic History   Marital status: Widowed    Spouse name: Not on file   Number of children: 0   Years of education: Not on file   Highest education level: Not on file  Occupational History   Not on file  Tobacco Use   Smoking status: Never   Smokeless  tobacco: Never  Vaping Use   Vaping status: Never Used  Substance and Sexual Activity   Alcohol use: Yes    Comment: socially   Drug use: No   Sexual activity: Not Currently    Partners: Male  Other Topics Concern   Not on file  Social History Narrative   Works for claims at [employer] Strain: Medium Risk (03/03/2021)   Received from [facility], [facility]   Overall Financial Resource Strain (CARDIA)    Difficulty of Paying Living Expenses: Somewhat hard  Food Insecurity: Food Insecurity Present (03/03/2021)   Received from [facility], [facility]   Hunger Vital Sign    Worried About Running Out of Food in the Last Year: Sometimes true    Ran Out of Food in the Last Year: Sometimes true  Transportation Needs: No Transportation Needs (03/03/2021)   Received from [facility], [facility]   PRAPARE - Transportation    Lack of Transportation (Medical): No    Lack of Transportation (Non-Medical): No  Physical Activity: Inactive (03/03/2021)   Received from [facility], [facility]   Exercise Vital Sign    Days of Exercise per Week: 0 days    Minutes of Exercise per Session: 30 min  Stress: Stress Concern Present (03/03/2021)   Received from [facility], [facility] - Occupational Stress Questionnaire    Feeling of Stress : Very much  Social Connections: Unknown (11/13/2021)   Received from [facility], [facility]   Social Network    Social Network: Not on file  Intimate Partner Violence: Unknown (10/05/2021)   Received from [facility], [facility]   HITS    Physically Hurt: Not on file    Insult or Talk Down To: Not on file    Threaten Physical Harm: Not on file    Scream or Curse: Not on file   Family Status  Relation Name Status   Mother  Deceased   Father  Deceased   Brother  Alive   MGM  (Not Specified)   PGF  (Not Specified)   Other  (Not Specified)    Neg Hx  (Not Specified)  No partnership data on file   Family History  Problem Relation Age of Onset   Breast cancer Mother    Prostate cancer Mother    Prostate cancer Father    Hypertension Maternal Grandmother    Hypertension Paternal Grandfather    Endometrial cancer Neg Hx    Ovarian cancer Neg Hx    Colon cancer Neg Hx    No Known Allergies  Patient Care Team: Hanin Decook, Bonna Gains, NP as PCP - General (Internal Medicine)   Medications: Outpatient Medications Prior to Visit  Medication Sig   acetaminophen (TYLENOL) 650 MG CR tablet Take 650-1,300 mg by mouth every 8 (  eight) hours as needed for pain.   aspirin EC 81 MG tablet Take 1 tablet (81 mg total) by mouth daily. Swallow whole.   ferrous gluconate (FERGON) 324 MG tablet Take 324 mg by mouth 2 (two) times daily with a meal.   Sodium Fluoride (FLUORIDEX DAILY DEFENSE DT)    [DISCONTINUED] atorvastatin (LIPITOR) 80 MG tablet TAKE 1 TABLET BY MOUTH EVERYDAY AT BEDTIME   [DISCONTINUED] ibuprofen (ADVIL) 800 MG tablet Take 1 tablet (800 mg total) by mouth every 8 (eight) hours as needed. For AFTER surgery only (Patient not taking: Reported on 06/16/2023)   [DISCONTINUED] senna-docusate (SENOKOT-S) 8.6-50 MG tablet Take 2 tablets by mouth at bedtime. For AFTER surgery. Do not take if having diarrhea. (Patient not taking: Reported on 06/16/2023)   No facility-administered medications prior to visit.    Review of Systems  Constitutional:  Negative for activity change, appetite change and unexpected weight change.  Respiratory: Negative.    Cardiovascular: Negative.   Gastrointestinal: Negative.   Endocrine: Negative for cold intolerance and heat intolerance.  Genitourinary: Negative.   Musculoskeletal: Negative.   Skin: Negative.   Neurological: Negative.   Hematological: Negative.   Psychiatric/Behavioral:  Negative for behavioral problems, decreased concentration, dysphoric mood, hallucinations, self-injury, sleep  disturbance and suicidal ideas. The patient is not nervous/anxious.         Objective:  BP 124/87 (BP Location: Left Arm, Patient Position: Sitting, Cuff Size: Large) Comment: recheck  Pulse 92   Temp 98.3 F (36.8 C) (Temporal)   Resp 18   Ht 4\' 11"  (1.499 m)   Wt 274 lb 3.2 oz (124.4 kg)   SpO2 98%   BMI 55.38 kg/m     Physical Exam Vitals and nursing note reviewed.  Constitutional:      General: She is not in acute distress. HENT:     Right Ear: Tympanic membrane, ear canal and external ear normal.     Left Ear: Tympanic membrane, ear canal and external ear normal.     Nose: Nose normal.  Eyes:     Extraocular Movements: Extraocular movements intact.     Conjunctiva/sclera: Conjunctivae normal.     Pupils: Pupils are equal, round, and reactive to light.  Neck:     Thyroid: No thyroid mass, thyromegaly or thyroid tenderness.  Cardiovascular:     Rate and Rhythm: Normal rate and regular rhythm.     Pulses: Normal pulses.     Heart sounds: Normal heart sounds.  Pulmonary:     Effort: Pulmonary effort is normal.     Breath sounds: Normal breath sounds.  Abdominal:     General: Bowel sounds are normal.     Palpations: Abdomen is soft.  Musculoskeletal:        General: Normal range of motion.     Cervical back: Normal range of motion and neck supple.     Right lower leg: No edema.     Left lower leg: No edema.  Lymphadenopathy:     Cervical: No cervical adenopathy.  Skin:    General: Skin is warm and dry.  Neurological:     Mental Status: She is alert and oriented to person, place, and time.     Cranial Nerves: No cranial nerve deficit.  Psychiatric:        Mood and Affect: Mood normal.        Behavior: Behavior normal.        Thought Content: Thought content normal.      No results  found for any visits on 06/16/23.    Assessment & Plan:    Routine Health Maintenance and Physical Exam  Immunization History  Administered Date(s) Administered    PFIZER(Purple Top)SARS-COV-2 Vaccination 03/18/2020, 04/08/2020   Tdap 06/09/2021    Health Maintenance  Topic Date Due   Colonoscopy  Never done   Zoster Vaccines- Shingrix (1 of 2) 09/14/2023 (Originally 10/28/1991)   INFLUENZA VACCINE  10/03/2023 (Originally 02/03/2023)   COVID-19 Vaccine (3 - Pfizer risk series) 10/03/2023 (Originally 05/06/2020)   Hepatitis C Screening  06/15/2024 (Originally 10/28/1990)   HIV Screening  06/15/2024 (Originally 10/28/1987)   MAMMOGRAM  09/06/2024   DTaP/Tdap/Td (2 - Td or Tdap) 06/10/2031   HPV VACCINES  Aged Out   Discussed health benefits of physical activity, and encouraged her to engage in regular exercise appropriate for her age and condition.  Problem List Items Addressed This Visit     Complex atypical endometrial hyperplasia   S/p hysterectomy with oophrectomy Per GYN oncology: Per report, no high grade cervical dyspalsia; no further pap smears indicated.      History of cerebrovascular accident (CVA) with residual deficit   Mildly elevated DBP, normal SBP: advised to maintain DASH diet and daily exercise Current use of ASA 81mg  and atorvastatin Repeat lipid panel and CMP today      Relevant Medications   atorvastatin (LIPITOR) 80 MG tablet   Other Relevant Orders   Lipid panel   Iron deficiency anemia due to chronic blood loss   Repeat cbc and iron panel      Relevant Orders   CBC with Differential/Platelet   IBC + Ferritin   Mixed hyperlipidemia   Relevant Medications   atorvastatin (LIPITOR) 80 MG tablet   Other Relevant Orders   Lipid panel   Morbid obesity (HCC)   Encouraged to maintain heart healthy diet and daily exercise. Wt Readings from Last 3 Encounters:  06/16/23 274 lb 3.2 oz (124.4 kg)  01/24/23 276 lb (125.2 kg)  01/11/23 272 lb (123.4 kg)         Prediabetes   Repeat hgbA1c      Relevant Orders   Hemoglobin A1c   Other Visit Diagnoses       Encounter for preventative adult health care exam with  abnormal findings    -  Primary   Relevant Orders   Comprehensive metabolic panel   Ambulatory referral to Gastroenterology     Colon cancer screening       Relevant Orders   Ambulatory referral to Gastroenterology      Return in about 1 year (around 06/15/2024) for CPE (fasting).     Alysia Penna, NP

## 2023-06-30 IMAGING — CT CT ANGIO HEAD-NECK (W OR W/O PERF)
2 of 7 series · 8 of 33 positions shown · non-contrast
Comparison: None.

CLINICAL DATA: Neuro deficit, acute, stroke suspected; Stroke/TIA,
assess intracranial arteries; Stroke/TIA, assess extracranial
arteries

EXAM:
CT HEAD WITHOUT CONTRAST
CT ANGIOGRAPHY OF THE HEAD AND NECK
TECHNIQUE: Contiguous axial images were obtained from the base of the skull
through the vertex without intravenous contrast. Multidetector CT
imaging of the head and neck was performed using the standard
protocol during bolus administration of intravenous contrast.
Multiplanar CT image reconstructions and MIPs were obtained to
evaluate the vascular anatomy. Carotid stenosis measurements (when
applicable) are obtained utilizing NASCET criteria, using the distal
internal carotid diameter as the denominator.

[Series 5: cta neck/head · axial · 0.48mm/px · z∈[-188,-90]mm · 2 of 149 slices shown]
[im 50/149  soft-tissue]
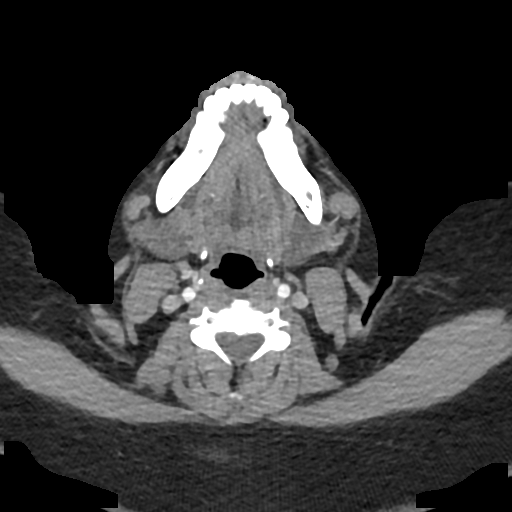
[im 99/149  soft-tissue]
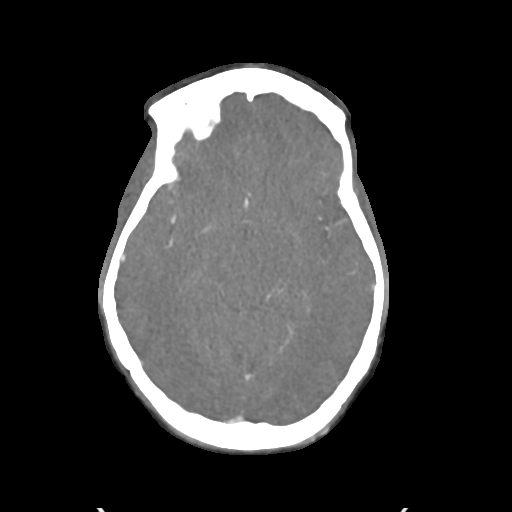

[Series 7: ax thins · axial · 0.40mm/px · z∈[-244,-32]mm · 6 of 298 slices shown]
[im 43/298  soft-tissue]
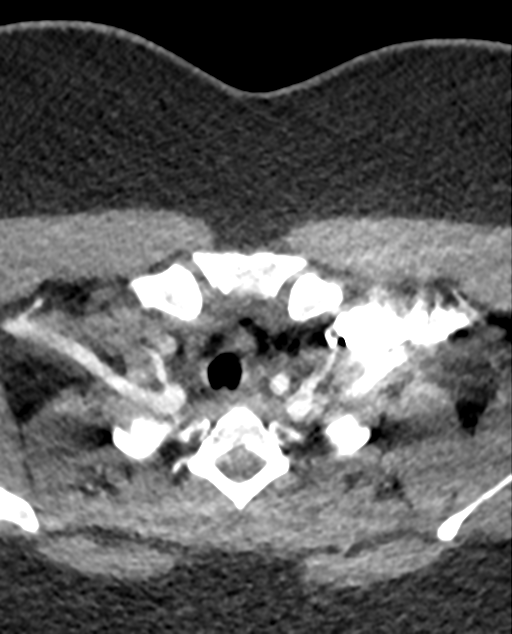
[im 85/298  bone]
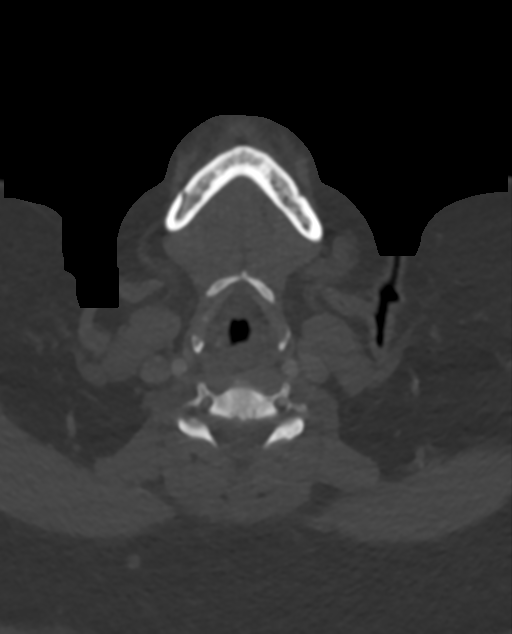
[im 128/298  soft-tissue]
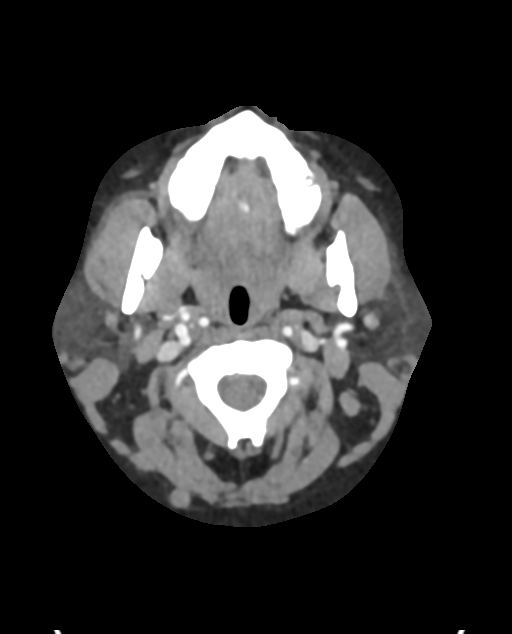
[im 170/298  bone]
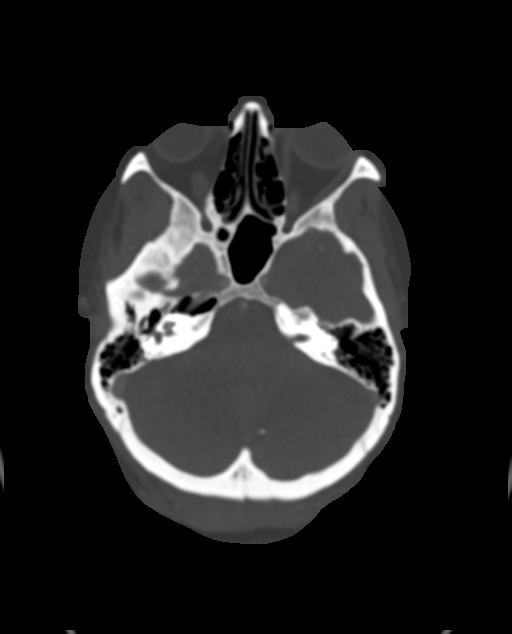
[im 213/298  soft-tissue]
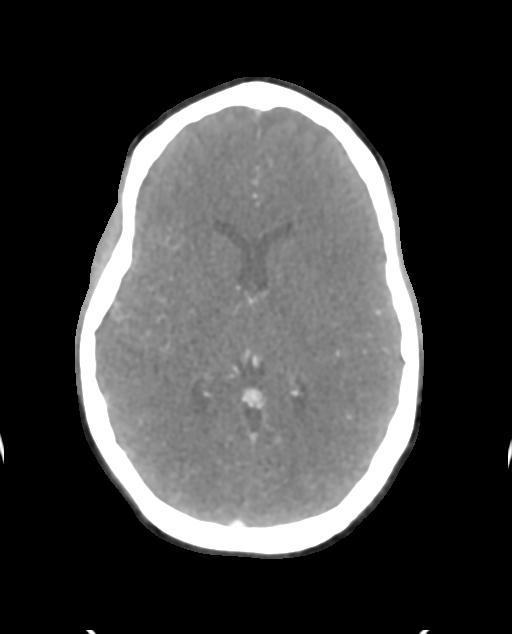
[im 255/298  bone]
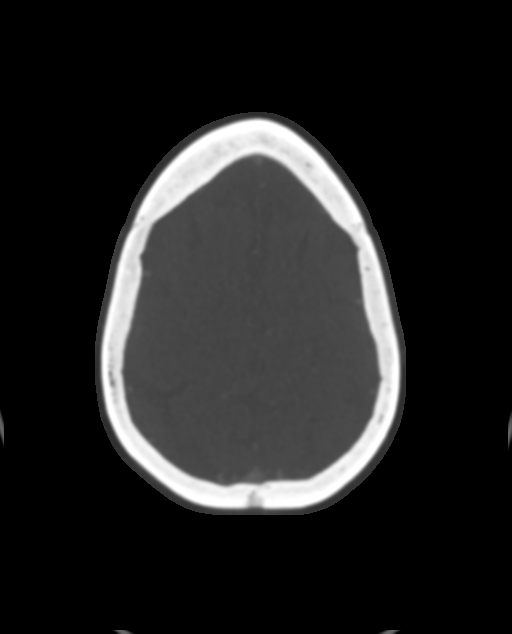

[8 of 33 positions shown; findings below may reference images not displayed]

FINDINGS: CT HEAD FINDINGS

Brain: There is no mass, hemorrhage or extra-axial collection. The
size and configuration of the ventricles and extra-axial CSF spaces
are normal. There is an area of hypoattenuation in the posterior
left insula the brain parenchyma is normal.

Skull: The visualized skull base, calvarium and extracranial soft
tissues are normal.

Sinuses/Orbits: No fluid levels or advanced mucosal thickening of
the visualized paranasal sinuses. No mastoid or middle ear effusion.
The orbits are normal.

ASPECTS (Alberta Stroke Program Early CT Score)

- Ganglionic level infarction (caudate, lentiform nuclei, internal
capsule, insula, M1-M3 cortex): 6

- Supraganglionic infarction (M4-M6 cortex): 3

Total score (0-10 with 10 being normal): 9

Review of the MIP images confirms the above findings

CTA NECK FINDINGS

SKELETON: There is no bony spinal canal stenosis. No lytic or
blastic lesion.

OTHER NECK: Normal pharynx, larynx and major salivary glands. No
cervical lymphadenopathy. Unremarkable thyroid gland.

UPPER CHEST: No pneumothorax or pleural effusion. No nodules or
masses.

AORTIC ARCH:

There is no calcific atherosclerosis of the aortic arch. There is no
aneurysm, dissection or hemodynamically significant stenosis of the
visualized portion of the aorta. Conventional 3 vessel aortic
branching pattern. The visualized proximal subclavian arteries are
widely patent.

RIGHT CAROTID SYSTEM: Normal without aneurysm, dissection or
stenosis.

LEFT CAROTID SYSTEM: Normal without aneurysm, dissection or
stenosis.

VERTEBRAL ARTERIES: Left dominant configuration. Both origins are
clearly patent. There is no dissection, occlusion or flow-limiting
stenosis to the skull base (V1-V3 segments).

CTA HEAD FINDINGS

POSTERIOR CIRCULATION:

--Vertebral arteries: Normal V4 segments.

--Inferior cerebellar arteries: Normal.

--Basilar artery: Normal.

--Superior cerebellar arteries: Normal.

--Posterior cerebral arteries (PCA): Normal.

ANTERIOR CIRCULATION:

--Intracranial internal carotid arteries: Normal.

--Anterior cerebral arteries (ACA): Normal. Both A1 segments are
present. Patent anterior communicating artery (a-comm).

--Middle cerebral arteries (MCA): There is occlusion of a left MCA
M2 segment branch (series 12, image 22 and series 7 images 97-102).
The right MCA is normal.

VENOUS SINUSES: As permitted by contrast timing, patent.

ANATOMIC VARIANTS: None

Review of the MIP images confirms the above findings.
IMPRESSION: 1. No acute hemorrhage. ASPECTS is 9 due to early ischemic changes
of the left insula.
2. Occlusion of a left MCA M2 segment branch.

Critical Value/emergent results were called by telephone at the time
of interpretation on 02/20/2021 at [DATE] to provider CHHANA
ORDAZ , who verbally acknowledged these results.

## 2023-07-04 ENCOUNTER — Inpatient Hospital Stay (HOSPITAL_BASED_OUTPATIENT_CLINIC_OR_DEPARTMENT_OTHER)
Admission: EM | Admit: 2023-07-04 | Discharge: 2023-07-08 | DRG: 854 | Disposition: A | Discharge status: Home / Self Care | Payer: No Typology Code available for payment source | Attending: Family Medicine | Admitting: Family Medicine

## 2023-07-04 ENCOUNTER — Encounter (HOSPITAL_BASED_OUTPATIENT_CLINIC_OR_DEPARTMENT_OTHER): Payer: Self-pay | Admitting: Emergency Medicine

## 2023-07-04 ENCOUNTER — Other Ambulatory Visit: Payer: Self-pay

## 2023-07-04 ENCOUNTER — Emergency Department (HOSPITAL_BASED_OUTPATIENT_CLINIC_OR_DEPARTMENT_OTHER): Payer: No Typology Code available for payment source

## 2023-07-04 DIAGNOSIS — Z803 Family history of malignant neoplasm of breast: Secondary | ICD-10-CM

## 2023-07-04 DIAGNOSIS — I69323 Fluency disorder following cerebral infarction: Secondary | ICD-10-CM

## 2023-07-04 DIAGNOSIS — A419 Sepsis, unspecified organism: Principal | ICD-10-CM | POA: Diagnosis present

## 2023-07-04 DIAGNOSIS — Z79899 Other long term (current) drug therapy: Secondary | ICD-10-CM

## 2023-07-04 DIAGNOSIS — Z6841 Body Mass Index (BMI) 40.0 and over, adult: Secondary | ICD-10-CM

## 2023-07-04 DIAGNOSIS — L03317 Cellulitis of buttock: Principal | ICD-10-CM | POA: Diagnosis present

## 2023-07-04 DIAGNOSIS — R7303 Prediabetes: Secondary | ICD-10-CM | POA: Diagnosis present

## 2023-07-04 DIAGNOSIS — L0231 Cutaneous abscess of buttock: Secondary | ICD-10-CM

## 2023-07-04 DIAGNOSIS — E66813 Obesity, class 3: Secondary | ICD-10-CM | POA: Diagnosis present

## 2023-07-04 DIAGNOSIS — E785 Hyperlipidemia, unspecified: Secondary | ICD-10-CM | POA: Diagnosis present

## 2023-07-04 DIAGNOSIS — Z8249 Family history of ischemic heart disease and other diseases of the circulatory system: Secondary | ICD-10-CM

## 2023-07-04 DIAGNOSIS — Z91041 Radiographic dye allergy status: Secondary | ICD-10-CM

## 2023-07-04 DIAGNOSIS — Z8042 Family history of malignant neoplasm of prostate: Secondary | ICD-10-CM

## 2023-07-04 DIAGNOSIS — D509 Iron deficiency anemia, unspecified: Secondary | ICD-10-CM | POA: Diagnosis present

## 2023-07-04 DIAGNOSIS — K6139 Other ischiorectal abscess: Secondary | ICD-10-CM | POA: Diagnosis present

## 2023-07-04 DIAGNOSIS — Z7982 Long term (current) use of aspirin: Secondary | ICD-10-CM

## 2023-07-04 DIAGNOSIS — N738 Other specified female pelvic inflammatory diseases: Secondary | ICD-10-CM | POA: Diagnosis present

## 2023-07-04 DIAGNOSIS — I69311 Memory deficit following cerebral infarction: Secondary | ICD-10-CM

## 2023-07-04 HISTORY — DX: Cutaneous abscess of buttock: L02.31

## 2023-07-04 LAB — URINALYSIS, W/ REFLEX TO CULTURE (INFECTION SUSPECTED)
Bilirubin Urine: NEGATIVE
Glucose, UA: NEGATIVE mg/dL
Leukocytes,Ua: NEGATIVE
Nitrite: NEGATIVE
Specific Gravity, Urine: 1.021 (ref 1.005–1.030)
pH: 5.5 (ref 5.0–8.0)

## 2023-07-04 LAB — COMPREHENSIVE METABOLIC PANEL
ALT: 47 U/L — ABNORMAL HIGH (ref 0–44)
AST: 56 U/L — ABNORMAL HIGH (ref 15–41)
Albumin: 4 g/dL (ref 3.5–5.0)
Alkaline Phosphatase: 122 U/L (ref 38–126)
Anion gap: 12 (ref 5–15)
BUN: 12 mg/dL (ref 6–20)
CO2: 24 mmol/L (ref 22–32)
Calcium: 9.6 mg/dL (ref 8.9–10.3)
Chloride: 101 mmol/L (ref 98–111)
Creatinine, Ser: 0.89 mg/dL (ref 0.44–1.00)
GFR, Estimated: >60 mL/min (ref >60)
Glucose, Bld: 104 mg/dL — ABNORMAL HIGH (ref 70–99)
Potassium: 3.9 mmol/L (ref 3.5–5.1)
Sodium: 137 mmol/L (ref 135–145)
Total Bilirubin: 1.5 mg/dL — ABNORMAL HIGH (ref 0.0–1.2)
Total Protein: 8.1 g/dL (ref 6.5–8.1)

## 2023-07-04 LAB — CBC WITH DIFFERENTIAL/PLATELET
Abs Immature Granulocytes: 0.15 10*3/uL — ABNORMAL HIGH (ref 0.00–0.07)
Basophils Absolute: 0 10*3/uL (ref 0.0–0.1)
Basophils Relative: 0 %
Eosinophils Absolute: 0 10*3/uL (ref 0.0–0.5)
Eosinophils Relative: 0 %
HCT: 40.4 % (ref 36.0–46.0)
Hemoglobin: 12.8 g/dL (ref 12.0–15.0)
Immature Granulocytes: 1 %
Lymphocytes Relative: 7 %
Lymphs Abs: 1.5 10*3/uL (ref 0.7–4.0)
MCH: 24 pg — ABNORMAL LOW (ref 26.0–34.0)
MCHC: 31.7 g/dL (ref 30.0–36.0)
MCV: 75.8 fL — ABNORMAL LOW (ref 80.0–100.0)
Monocytes Absolute: 1 10*3/uL (ref 0.1–1.0)
Monocytes Relative: 5 %
Neutro Abs: 18.7 10*3/uL — ABNORMAL HIGH (ref 1.7–7.7)
Neutrophils Relative %: 87 %
Platelets: 281 10*3/uL (ref 150–400)
RBC: 5.33 MIL/uL — ABNORMAL HIGH (ref 3.87–5.11)
RDW: 16.1 % — ABNORMAL HIGH (ref 11.5–15.5)
WBC: 21.4 10*3/uL — ABNORMAL HIGH (ref 4.0–10.5)
nRBC: 0 % (ref 0.0–0.2)

## 2023-07-04 LAB — LACTIC ACID, PLASMA
Lactic Acid, Venous: 0.9 mmol/L (ref 0.5–1.9)
Lactic Acid, Venous: 1 mmol/L (ref 0.5–1.9)

## 2023-07-04 MED ORDER — DIPHENHYDRAMINE HCL 50 MG/ML IJ SOLN
12.5000 mg | Freq: Once | INTRAMUSCULAR | Status: AC
  Administered 2023-07-04: 12.5 mg via INTRAVENOUS
  Filled 2023-07-04: qty 1

## 2023-07-04 MED ORDER — ACETAMINOPHEN 325 MG PO TABS
650.0000 mg | ORAL_TABLET | ORAL | Status: DC | PRN
  Administered 2023-07-04: 650 mg via ORAL
  Filled 2023-07-04: qty 2

## 2023-07-04 MED ORDER — METRONIDAZOLE 500 MG/100ML IV SOLN
500.0000 mg | Freq: Two times a day (BID) | INTRAVENOUS | Status: DC
  Administered 2023-07-05 (×2): 500 mg via INTRAVENOUS
  Filled 2023-07-04 (×2): qty 100

## 2023-07-04 MED ORDER — POTASSIUM CHLORIDE IN NACL 20-0.9 MEQ/L-% IV SOLN
INTRAVENOUS | Status: DC
  Filled 2023-07-04 (×2): qty 1000

## 2023-07-04 MED ORDER — IOHEXOL 300 MG/ML  SOLN
100.0000 mL | Freq: Once | INTRAMUSCULAR | Status: AC | PRN
  Administered 2023-07-04: 80 mL via INTRAVENOUS

## 2023-07-04 MED ORDER — ACETAMINOPHEN 325 MG PO TABS
650.0000 mg | ORAL_TABLET | Freq: Once | ORAL | Status: AC | PRN
  Administered 2023-07-04: 650 mg via ORAL
  Filled 2023-07-04: qty 2

## 2023-07-04 MED ORDER — ONDANSETRON HCL 4 MG/2ML IJ SOLN
4.0000 mg | Freq: Four times a day (QID) | INTRAMUSCULAR | Status: DC | PRN
  Administered 2023-07-07: 4 mg via INTRAVENOUS
  Filled 2023-07-04: qty 2

## 2023-07-04 MED ORDER — CEFTRIAXONE SODIUM 500 MG IJ SOLR: 250.0000 mg | Freq: Once | INTRAMUSCULAR | Status: DC

## 2023-07-04 MED ORDER — SODIUM CHLORIDE 0.9 % IV SOLN
2.0000 g | Freq: Once | INTRAVENOUS | Status: AC
  Administered 2023-07-04: 2 g via INTRAVENOUS
  Filled 2023-07-04: qty 20

## 2023-07-04 MED ORDER — HYDROMORPHONE HCL 1 MG/ML IJ SOLN
0.5000 mg | INTRAMUSCULAR | Status: DC | PRN
  Administered 2023-07-05 (×2): 0.5 mg via INTRAVENOUS
  Filled 2023-07-04: qty 0.5
  Filled 2023-07-04: qty 1

## 2023-07-04 MED ORDER — HYDROMORPHONE HCL 1 MG/ML IJ SOLN: 1.0000 mg | INTRAMUSCULAR | Status: DC | PRN

## 2023-07-04 NOTE — ED Notes (Signed)
Security called for patient to speak to about her car, due to being admitted.

## 2023-07-04 NOTE — ED Triage Notes (Signed)
Abscess between buttocks. No drainage. Started Friday accompanied by fever. No respiratory or flu symptoms. 102 fever in triage.

## 2023-07-04 NOTE — ED Provider Notes (Signed)
[facility] Provider Note   CSN: [id number] Arrival date & time: 07/04/23  1525     History  Chief Complaint  Patient presents with   Abscess   Fever    Renee Hahn is a 52 y.o. female.  Patient complains of a swollen area on her left buttock.  Patient reports that she noticed the area on Friday.  Patient reports today she has been running a fever and having increased pain.  Patient denies any nausea or vomiting she is not having any abdominal pain.  The history is provided by the patient. No language interpreter was used.  Abscess Location:  Pelvis Pelvic abscess location:  L buttock Abscess quality: redness and warmth   Red streaking: no   Duration:  3 days Progression:  Worsening Chronicity:  New Context: not diabetes and not injected drug use   Relieved by:  Nothing Ineffective treatments:  None tried Associated symptoms: fever   Fever      Home Medications Prior to Admission medications   Medication Sig Start Date End Date Taking? Authorizing Provider  acetaminophen (TYLENOL) 650 MG CR tablet Take 650-1,300 mg by mouth every 8 (eight) hours as needed for pain.    [provider]  aspirin EC 81 MG tablet Take 1 tablet (81 mg total) by mouth daily. Swallow whole. 12/02/22   Nche, Bonna Gains, NP  atorvastatin (LIPITOR) 80 MG tablet Take 1 tablet (80 mg total) by mouth daily. 06/16/23   Nche, Bonna Gains, NP  ferrous gluconate (FERGON) 324 MG tablet Take 324 mg by mouth 2 (two) times daily with a meal.    [provider]  Sodium Fluoride (FLUORIDEX DAILY DEFENSE DT)  08/05/22   [provider]      Allergies    Patient has no known allergies.    Review of Systems   Review of Systems  Constitutional:  Positive for fever.  All other systems reviewed and are negative.   Physical Exam Updated Vital Signs BP 92/65   Pulse 92   Temp (!) 100.6 F (38.1 C) (Oral)   Resp 18   Wt  123.4 kg   LMP 11/17/2022 (Approximate)   SpO2 97%   BMI 54.94 kg/m  Physical Exam Vitals reviewed.  Constitutional:      Appearance: Normal appearance.  HENT:     Head: Normocephalic.     Mouth/Throat:     Mouth: Mucous membranes are moist.  Cardiovascular:     Rate and Rhythm: Tachycardia present.  Pulmonary:     Effort: Pulmonary effort is normal.  Abdominal:     General: Abdomen is flat.     Palpations: Abdomen is soft.  Musculoskeletal:        General: Swelling and tenderness present.     Comments: Left buttock 10 x 12 cm area of erythema and tenderness, no obvious fluctuance  Skin:    General: Skin is warm.  Neurological:     General: No focal deficit present.     Mental Status: She is alert.     ED Results / Procedures / Treatments   Labs (all labs ordered are listed, but only abnormal results are displayed) Labs Reviewed  COMPREHENSIVE METABOLIC PANEL - Abnormal; Notable for the following components:      Result Value   Glucose, Bld 104 (*)    AST 56 (*)    ALT 47 (*)    Total Bilirubin 1.5 (*)    All other  components within normal limits  CBC WITH DIFFERENTIAL/PLATELET - Abnormal; Notable for the following components:   WBC 21.4 (*)    RBC 5.33 (*)    MCV 75.8 (*)    MCH 24.0 (*)    RDW 16.1 (*)    Neutro Abs 18.7 (*)    Abs Immature Granulocytes 0.15 (*)    All other components within normal limits  URINALYSIS, W/ REFLEX TO CULTURE (INFECTION SUSPECTED) - Abnormal; Notable for the following components:   APPearance HAZY (*)    Hgb urine dipstick SMALL (*)    Ketones, ur TRACE (*)    Protein, ur TRACE (*)    Bacteria, UA RARE (*)    All other components within normal limits  LACTIC ACID, PLASMA  LACTIC ACID, PLASMA    EKG None  Radiology CT PELVIS W CONTRAST Result Date: 07/04/2023 CLINICAL DATA:  Soft tissue inflammatory change between the buttocks, initial encounter EXAM: CT PELVIS WITH CONTRAST TECHNIQUE: Multidetector CT imaging of the  pelvis was performed using the standard protocol following the bolus administration of intravenous contrast. RADIATION DOSE REDUCTION: This exam was performed according to the departmental dose-optimization program which includes automated exposure control, adjustment of the mA and/or kV according to patient size and/or use of iterative reconstruction technique. CONTRAST:  80mL OMNIPAQUE IOHEXOL 300 MG/ML  SOLN COMPARISON:  None Available. FINDINGS: Urinary Tract: Bladder is decompressed. Ureteral dilatation is seen. Visualized kidneys are unremarkable. Bowel:  Bowel as visualized is within normal limits. Vascular/Lymphatic: No vascular abnormality is noted. No lymphadenopathy is seen. Reproductive:  Uterus and ovaries have been surgically removed. Other:  No free fluid is noted. Musculoskeletal: No acute bony abnormality is seen. Subcutaneous inflammatory changes noted in the left buttock with both fluid and air identified. The fluid collection measures approximately 2.9 x 1.6 cm deep along the intergluteal cleft on the left. The air collection is posterior to this along the intergluteal cleft. No other fluid abnormality is seen. IMPRESSION: Subcutaneous air, inflammatory change and small fluid collection along the medial aspect of the left buttock adjacent to the intergluteal cleft. This is consistent with abscess formation. Electronically Signed   By: Alcide Clever M.D.   On: 07/04/2023 21:49    Procedures Procedures    Medications Ordered in ED Medications  acetaminophen (TYLENOL) tablet 650 mg (650 mg Oral Given 07/04/23 1710)  cefTRIAXone (ROCEPHIN) 2 g in sodium chloride 0.9 % 100 mL IVPB (0 g Intravenous Stopped 07/04/23 2025)  iohexol (OMNIPAQUE) 300 MG/ML solution 100 mL (80 mLs Intravenous Contrast Given 07/04/23 1957)  diphenhydrAMINE (BENADRYL) injection 12.5 mg (12.5 mg Intravenous Given 07/04/23 2018)    ED Course/ Medical Decision Making/ A&P                                 Medical  Decision Making Patient complains of a swollen knot on her left buttock.  Patient reports she has had a fever today  Amount and/or Complexity of Data Reviewed Labs: ordered. Decision-making details documented in ED Course.    Details: Labs ordered reviewed and interpreted.  She has a white blood cell count of 21.4 Radiology: ordered and independent interpretation performed. Decision-making details documented in ED Course.    Details: CT pelvis obtained CT scan shows subcutaneous air inflammatory changes and a small fluid collection along the medial aspect of the left buttock adjacent to the intergluteal cleft Discussion of management or test interpretation with external  provider(s): General Surgeon consulted Dr. Andrey Campanile advised pt will need to be npo after midnight  Hospitalist consulted for admission   Risk OTC drugs. Prescription drug management. Decision regarding hospitalization. Risk Details: Patient given IV Rocephin x 2 g.           Final Clinical Impression(s) / ED Diagnoses Final diagnoses:  Cellulitis of left buttock  Abscess of left buttock    Rx / DC Orders ED Discharge Orders     None         Osie Cheeks 07/04/23 2329    Pricilla Loveless, MD 07/04/23 2340

## 2023-07-05 ENCOUNTER — Encounter (HOSPITAL_COMMUNITY)
Admission: EM | Disposition: A | Discharge status: Home / Self Care | Payer: Self-pay | Source: Home / Self Care | Attending: Family Medicine

## 2023-07-05 ENCOUNTER — Inpatient Hospital Stay (HOSPITAL_COMMUNITY): Payer: No Typology Code available for payment source | Admitting: Certified Registered Nurse Anesthetist

## 2023-07-05 ENCOUNTER — Other Ambulatory Visit: Payer: Self-pay

## 2023-07-05 ENCOUNTER — Encounter (HOSPITAL_COMMUNITY): Payer: Self-pay | Admitting: Internal Medicine

## 2023-07-05 DIAGNOSIS — Z803 Family history of malignant neoplasm of breast: Secondary | ICD-10-CM | POA: Diagnosis not present

## 2023-07-05 DIAGNOSIS — K611 Rectal abscess: Secondary | ICD-10-CM

## 2023-07-05 DIAGNOSIS — L0231 Cutaneous abscess of buttock: Secondary | ICD-10-CM | POA: Diagnosis present

## 2023-07-05 DIAGNOSIS — L03317 Cellulitis of buttock: Secondary | ICD-10-CM

## 2023-07-05 DIAGNOSIS — E785 Hyperlipidemia, unspecified: Secondary | ICD-10-CM | POA: Diagnosis present

## 2023-07-05 DIAGNOSIS — Z6841 Body Mass Index (BMI) 40.0 and over, adult: Secondary | ICD-10-CM | POA: Diagnosis not present

## 2023-07-05 DIAGNOSIS — K6139 Other ischiorectal abscess: Secondary | ICD-10-CM | POA: Diagnosis present

## 2023-07-05 DIAGNOSIS — I69323 Fluency disorder following cerebral infarction: Secondary | ICD-10-CM | POA: Diagnosis not present

## 2023-07-05 DIAGNOSIS — I69311 Memory deficit following cerebral infarction: Secondary | ICD-10-CM | POA: Diagnosis not present

## 2023-07-05 DIAGNOSIS — E66813 Obesity, class 3: Secondary | ICD-10-CM | POA: Diagnosis present

## 2023-07-05 DIAGNOSIS — Z91041 Radiographic dye allergy status: Secondary | ICD-10-CM | POA: Diagnosis not present

## 2023-07-05 DIAGNOSIS — A419 Sepsis, unspecified organism: Secondary | ICD-10-CM | POA: Diagnosis present

## 2023-07-05 DIAGNOSIS — N738 Other specified female pelvic inflammatory diseases: Secondary | ICD-10-CM | POA: Diagnosis present

## 2023-07-05 DIAGNOSIS — Z8042 Family history of malignant neoplasm of prostate: Secondary | ICD-10-CM | POA: Diagnosis not present

## 2023-07-05 DIAGNOSIS — Z8249 Family history of ischemic heart disease and other diseases of the circulatory system: Secondary | ICD-10-CM | POA: Diagnosis not present

## 2023-07-05 DIAGNOSIS — Z79899 Other long term (current) drug therapy: Secondary | ICD-10-CM | POA: Diagnosis not present

## 2023-07-05 DIAGNOSIS — D509 Iron deficiency anemia, unspecified: Secondary | ICD-10-CM | POA: Diagnosis present

## 2023-07-05 DIAGNOSIS — Z7982 Long term (current) use of aspirin: Secondary | ICD-10-CM | POA: Diagnosis not present

## 2023-07-05 DIAGNOSIS — R7303 Prediabetes: Secondary | ICD-10-CM | POA: Diagnosis present

## 2023-07-05 HISTORY — PX: IRRIGATION AND DEBRIDEMENT BUTTOCKS: SHX6601

## 2023-07-05 HISTORY — DX: Cutaneous abscess of buttock: L02.31

## 2023-07-05 LAB — CBC
HCT: 37.8 % (ref 36.0–46.0)
Hemoglobin: 11.5 g/dL — ABNORMAL LOW (ref 12.0–15.0)
MCH: 23.8 pg — ABNORMAL LOW (ref 26.0–34.0)
MCHC: 30.4 g/dL (ref 30.0–36.0)
MCV: 78.1 fL — ABNORMAL LOW (ref 80.0–100.0)
Platelets: 258 10*3/uL (ref 150–400)
RBC: 4.84 MIL/uL (ref 3.87–5.11)
RDW: 16.2 % — ABNORMAL HIGH (ref 11.5–15.5)
WBC: 17.4 10*3/uL — ABNORMAL HIGH (ref 4.0–10.5)
nRBC: 0 % (ref 0.0–0.2)

## 2023-07-05 LAB — CREATININE, SERUM
Creatinine, Ser: 0.95 mg/dL (ref 0.44–1.00)
GFR, Estimated: >60 mL/min (ref >60)

## 2023-07-05 LAB — HIV ANTIBODY (ROUTINE TESTING W REFLEX): HIV Screen 4th Generation wRfx: NONREACTIVE

## 2023-07-05 SURGERY — IRRIGATION AND DEBRIDEMENT BUTTOCKS
Anesthesia: General | Laterality: Left

## 2023-07-05 MED ORDER — LIDOCAINE 2% (20 MG/ML) 5 ML SYRINGE
INTRAMUSCULAR | Status: DC | PRN
  Administered 2023-07-05: 100 mg via INTRAVENOUS

## 2023-07-05 MED ORDER — VANCOMYCIN HCL 2000 MG/400ML IV SOLN
2000.0000 mg | Freq: Once | INTRAVENOUS | Status: AC
  Administered 2023-07-05: 2000 mg via INTRAVENOUS
  Filled 2023-07-05: qty 400

## 2023-07-05 MED ORDER — MIDAZOLAM HCL 2 MG/2ML IJ SOLN
INTRAMUSCULAR | Status: DC | PRN
  Administered 2023-07-05: 2 mg via INTRAVENOUS

## 2023-07-05 MED ORDER — ONDANSETRON HCL 4 MG/2ML IJ SOLN
INTRAMUSCULAR | Status: AC
  Filled 2023-07-05: qty 2

## 2023-07-05 MED ORDER — OXYCODONE HCL 5 MG PO TABS
5.0000 mg | ORAL_TABLET | Freq: Once | ORAL | Status: AC | PRN
  Administered 2023-07-05: 5 mg via ORAL

## 2023-07-05 MED ORDER — ACETAMINOPHEN 10 MG/ML IV SOLN
1000.0000 mg | Freq: Once | INTRAVENOUS | Status: DC | PRN
Start: 2023-07-05 — End: 2023-07-05
  Administered 2023-07-05: 1000 mg via INTRAVENOUS

## 2023-07-05 MED ORDER — PROPOFOL 500 MG/50ML IV EMUL
INTRAVENOUS | Status: AC
  Filled 2023-07-05: qty 50

## 2023-07-05 MED ORDER — PROPOFOL 10 MG/ML IV BOLUS
INTRAVENOUS | Status: DC | PRN
  Administered 2023-07-05: 200 mg via INTRAVENOUS

## 2023-07-05 MED ORDER — PHENYLEPHRINE 80 MCG/ML (10ML) SYRINGE FOR IV PUSH (FOR BLOOD PRESSURE SUPPORT)
PREFILLED_SYRINGE | INTRAVENOUS | Status: DC | PRN
  Administered 2023-07-05: 160 ug via INTRAVENOUS

## 2023-07-05 MED ORDER — DEXAMETHASONE SODIUM PHOSPHATE 4 MG/ML IJ SOLN
INTRAMUSCULAR | Status: DC | PRN
  Administered 2023-07-05: 5 mg via INTRAVENOUS

## 2023-07-05 MED ORDER — FENTANYL CITRATE PF 50 MCG/ML IJ SOSY
PREFILLED_SYRINGE | INTRAMUSCULAR | Status: AC
  Filled 2023-07-05: qty 1

## 2023-07-05 MED ORDER — PHENYLEPHRINE 80 MCG/ML (10ML) SYRINGE FOR IV PUSH (FOR BLOOD PRESSURE SUPPORT)
PREFILLED_SYRINGE | INTRAVENOUS | Status: AC
  Filled 2023-07-05: qty 10

## 2023-07-05 MED ORDER — VANCOMYCIN HCL 1.5 G IV SOLR
1500.0000 mg | INTRAVENOUS | Status: DC
  Administered 2023-07-06 – 2023-07-07 (×2): 1500 mg via INTRAVENOUS
  Filled 2023-07-05 (×3): qty 30

## 2023-07-05 MED ORDER — DEXAMETHASONE SODIUM PHOSPHATE 10 MG/ML IJ SOLN
INTRAMUSCULAR | Status: AC
  Filled 2023-07-05: qty 1

## 2023-07-05 MED ORDER — FENTANYL CITRATE (PF) 100 MCG/2ML IJ SOLN
INTRAMUSCULAR | Status: AC
  Filled 2023-07-05: qty 2

## 2023-07-05 MED ORDER — ONDANSETRON HCL 4 MG/2ML IJ SOLN
INTRAMUSCULAR | Status: DC | PRN
  Administered 2023-07-05: 4 mg via INTRAVENOUS

## 2023-07-05 MED ORDER — ONDANSETRON HCL 4 MG/2ML IJ SOLN
4.0000 mg | Freq: Once | INTRAMUSCULAR | Status: AC | PRN
  Administered 2023-07-05: 4 mg via INTRAVENOUS

## 2023-07-05 MED ORDER — OXYCODONE HCL 5 MG PO TABS
ORAL_TABLET | ORAL | Status: AC
  Filled 2023-07-05: qty 1

## 2023-07-05 MED ORDER — HYDROCODONE-ACETAMINOPHEN 5-325 MG PO TABS
1.0000 | ORAL_TABLET | ORAL | Status: DC | PRN
  Administered 2023-07-06: 1 via ORAL
  Administered 2023-07-06: 2 via ORAL
  Administered 2023-07-06 – 2023-07-08 (×5): 1 via ORAL
  Filled 2023-07-05 (×6): qty 1
  Filled 2023-07-05: qty 2

## 2023-07-05 MED ORDER — FENTANYL CITRATE PF 50 MCG/ML IJ SOSY
25.0000 ug | PREFILLED_SYRINGE | INTRAMUSCULAR | Status: DC | PRN
Start: 2023-07-05 — End: 2023-07-05
  Administered 2023-07-05 (×2): 25 ug via INTRAVENOUS

## 2023-07-05 MED ORDER — ACETAMINOPHEN 10 MG/ML IV SOLN
INTRAVENOUS | Status: AC
  Filled 2023-07-05: qty 100

## 2023-07-05 MED ORDER — CHLORHEXIDINE GLUCONATE 0.12 % MT SOLN
15.0000 mL | Freq: Once | OROMUCOSAL | Status: AC
  Administered 2023-07-05: 15 mL via OROMUCOSAL

## 2023-07-05 MED ORDER — FENTANYL CITRATE (PF) 100 MCG/2ML IJ SOLN
INTRAMUSCULAR | Status: DC | PRN
  Administered 2023-07-05 (×3): 50 ug via INTRAVENOUS

## 2023-07-05 MED ORDER — LIDOCAINE HCL (PF) 2 % IJ SOLN
INTRAMUSCULAR | Status: AC
  Filled 2023-07-05: qty 5

## 2023-07-05 MED ORDER — KETOROLAC TROMETHAMINE 30 MG/ML IJ SOLN
INTRAMUSCULAR | Status: AC
  Filled 2023-07-05: qty 1

## 2023-07-05 MED ORDER — OXYCODONE HCL 5 MG/5ML PO SOLN: 5.0000 mg | Freq: Once | ORAL | Status: AC | PRN

## 2023-07-05 MED ORDER — BUPIVACAINE-EPINEPHRINE 0.25% -1:200000 IJ SOLN
INTRAMUSCULAR | Status: DC | PRN
  Administered 2023-07-05: 40 mL

## 2023-07-05 MED ORDER — LACTATED RINGERS IV SOLN: INTRAVENOUS | Status: DC

## 2023-07-05 MED ORDER — MIDAZOLAM HCL 2 MG/2ML IJ SOLN
INTRAMUSCULAR | Status: AC
  Filled 2023-07-05: qty 2

## 2023-07-05 MED ORDER — HEPARIN SODIUM (PORCINE) 5000 UNIT/ML IJ SOLN
5000.0000 [IU] | Freq: Three times a day (TID) | INTRAMUSCULAR | Status: DC
  Administered 2023-07-05 – 2023-07-08 (×8): 5000 [IU] via SUBCUTANEOUS
  Filled 2023-07-05 (×9): qty 1

## 2023-07-05 SURGICAL SUPPLY — 23 items
BAG COUNTER SPONGE SURGICOUNT (BAG) IMPLANT
BLADE SURG SZ10 CARB STEEL (BLADE) ×2 IMPLANT
BNDG GAUZE DERMACEA FLUFF 4 (GAUZE/BANDAGES/DRESSINGS) IMPLANT
DERMABOND ADVANCED .7 DNX12 (GAUZE/BANDAGES/DRESSINGS) IMPLANT
DRAPE LAPAROTOMY TRNSV 102X78 (DRAPES) IMPLANT
ELECT REM PT RETURN 15FT ADLT (MISCELLANEOUS) ×2 IMPLANT
GAUZE PAD ABD 8X10 STRL (GAUZE/BANDAGES/DRESSINGS) IMPLANT
GAUZE SPONGE 4X4 12PLY STRL (GAUZE/BANDAGES/DRESSINGS) ×2 IMPLANT
GLOVE INDICATOR 6.5 STRL GRN (GLOVE) ×4 IMPLANT
GOWN STRL REUS W/ TWL XL LVL3 (GOWN DISPOSABLE) ×4 IMPLANT
KIT BASIN OR (CUSTOM PROCEDURE TRAY) ×2 IMPLANT
KIT TURNOVER KIT A (KITS) IMPLANT
MAT PREVALON FULL STRYKER (MISCELLANEOUS) IMPLANT
NDL HYPO 22X1.5 SAFETY MO (MISCELLANEOUS) ×2 IMPLANT
NEEDLE HYPO 22X1.5 SAFETY MO (MISCELLANEOUS) ×1 IMPLANT
PACK BASIC VI WITH GOWN DISP (CUSTOM PROCEDURE TRAY) ×2 IMPLANT
PENCIL SMOKE EVACUATOR (MISCELLANEOUS) IMPLANT
SPIKE FLUID TRANSFER (MISCELLANEOUS) IMPLANT
STAPLER SKIN PROX WIDE 3.9 (STAPLE) IMPLANT
SUT VIC AB 3-0 SH 18 (SUTURE) IMPLANT
SUT VIC AB 4-0 PS2 18 (SUTURE) IMPLANT
SYR 20ML LL LF (SYRINGE) ×2 IMPLANT
TOWEL OR 17X26 10 PK STRL BLUE (TOWEL DISPOSABLE) ×2 IMPLANT

## 2023-07-05 NOTE — Progress Notes (Signed)
   07/05/23 1315  Assess: MEWS Score  Temp 99 F (37.2 C)  BP 138/75  MAP (mmHg) 89  Pulse Rate (!) 127  Resp (!) 22  Level of Consciousness Alert  SpO2 99 %  O2 Device Room Air  Assess: MEWS Score  MEWS Temp 0  MEWS Systolic 0  MEWS Pulse 2  MEWS RR 1  MEWS LOC 0  MEWS Score 3  MEWS Score Color Yellow  Assess: if the MEWS score is Yellow or Red  Were vital signs accurate and taken at a resting state? Yes  Does the patient meet 2 or more of the SIRS criteria? Yes  Does the patient have a confirmed or suspected source of infection? Yes  MEWS guidelines implemented  Yes, yellow  Treat  MEWS Interventions Considered administering scheduled or prn medications/treatments as ordered  Take Vital Signs  Increase Vital Sign Frequency  Yellow: Q2hr x1, continue Q4hrs until patient remains green for 12hrs  Escalate  MEWS: Escalate Yellow: Discuss with charge nurse and consider notifying provider and/or RRT  Notify: Charge Nurse/RN  Name of Charge Nurse/RN Notified Lonell Alstrom, RN  Provider Notification  Provider Name/Title Darcel Dawley, MD  Date Provider Notified 07/05/23  Time Provider Notified 1400  Method of Notification Page  Notification Reason Other (Comment) (HR elevated and RR elevated)  Provider response Other (Comment) (Patient was passed of to another provider)  Date of Provider Response 07/05/23  Time of Provider Response 1405  Notify: Rapid Response  Name of Rapid Response RN Notified Omega Settles, RN  Date Rapid Response Notified 07/05/23  Time Rapid Response Notified 1404  Assess: SIRS CRITERIA  SIRS Temperature  0  SIRS Respirations  1  SIRS Pulse 1  SIRS WBC 0  SIRS Score Sum  2   Patient not in any distress. Patient had 5/10 pain from left abscess of buttock. Gave PRN dilaudid  per order. Patient's heart rate came down to 107 and patient feels more comfortable. Patient's current admission attending, Dr. Lue, came to bedside to assess. RN updated MD  about the elevated HR and RR.

## 2023-07-05 NOTE — ED Notes (Signed)
 Attempted to call report to IP RN, no answer. Will call back

## 2023-07-05 NOTE — Op Note (Signed)
 07/04/2023 - 07/05/2023  4:32 PM  PATIENT:  Renee Hahn  52 y.o. female  Patient Care Team: Nche, Roselie Rockford, NP as PCP - General (Internal Medicine)  PRE-OPERATIVE DIAGNOSIS:  Perirectal abscess  POST-OPERATIVE DIAGNOSIS:  Perirectal abscess  PROCEDURE:  INCISION AND DRAINAGE of PERIRECTAL ABSCESS    Surgeon(s): Debby Hila, MD  ASSISTANT: none   ANESTHESIA:   local and Hahn  SPECIMEN:  No Specimen  DISPOSITION OF SPECIMEN:  N/A  COUNTS:  YES  PLAN OF CARE:  Pt already admitted  PATIENT DISPOSITION:  PACU - hemodynamically stable.  INDICATION: 52 y.o. F with deep ischiorectal abscess   OR FINDINGS: large perirectal abscess ~4x8cm  DESCRIPTION: the patient was identified in the preoperative holding area and taken to the OR where they were laid on the operating room table.  Hahn anesthesia was induced without difficulty. The patient was then positioned in prone jackknife position with buttocks gently taped apart.  The patient was then prepped and draped in usual sterile fashion.  SCDs were noted to be in place prior to the initiation of anesthesia. A surgical timeout was performed indicating the correct patient, procedure, positioning and need for preoperative antibiotics.  A rectal block was performed using Marcaine  with epinephrine .    I began with a digital rectal exam.  There were no rectal abnormalities.  I used a needle to aspirate the area and find the central portion of the abscess.  Once this was done an incision was made using a 15 blade scalpel.  Purulence was encountered as well as air.  I then enlarge this incision to unroofed the entire cavity using Bovie electrocautery.  I used electrocautery to obtain hemostasis of the skin edges.  I then packed with a Curlex sponge for hemostasis.  A dressing was applied.  The patient was then awakened from anesthesia and sent to the postanesthesia care unit in stable condition.  All counts were correct per operating  room staff.  Hila JAYSON Debby, MD  Colorectal and General Surgery [facility]

## 2023-07-05 NOTE — Anesthesia Postprocedure Evaluation (Signed)
 Anesthesia Post Note  Patient: Renee Hahn  Procedure(s) Performed: IRRIGATION AND DEBRIDEMENT LEFT BUTTOCKS (Left)     Patient location during evaluation: PACU Anesthesia Type: General Level of consciousness: awake and alert Pain management: pain level controlled Vital Signs Assessment: post-procedure vital signs reviewed and stable Respiratory status: spontaneous breathing, nonlabored ventilation, respiratory function stable and patient connected to nasal cannula oxygen Cardiovascular status: blood pressure returned to baseline and stable Postop Assessment: no apparent nausea or vomiting Anesthetic complications: no   No notable events documented.  Last Vitals:  Vitals:   07/05/23 1715 07/05/23 1745  BP: 132/84 108/79  Pulse: (!) 115 (!) 122  Resp: 14 19  Temp:  37 C  SpO2: 96% 99%    Last Pain:  Vitals:   07/05/23 1745  TempSrc:   PainSc: 5                  Lynwood MARLA Cornea

## 2023-07-05 NOTE — Plan of Care (Signed)
   Problem: Education: Goal: Knowledge of General Education information will improve Description: Including pain rating scale, medication(s)/side effects and non-pharmacologic comfort measures Outcome: Progressing   Problem: Activity: Goal: Risk for activity intolerance will decrease Outcome: Progressing   Problem: Coping: Goal: Level of anxiety will decrease Outcome: Progressing   Problem: Elimination: Goal: Will not experience complications related to urinary retention Outcome: Progressing   Problem: Pain Management: Goal: General experience of comfort will improve Outcome: Progressing   Problem: Safety: Goal: Ability to remain free from injury will improve Outcome: Progressing   Problem: Skin Integrity: Goal: Risk for impaired skin integrity will decrease Outcome: Progressing

## 2023-07-05 NOTE — ED Notes (Signed)
 Pt ambulates independently to bathroom with steady gait.

## 2023-07-05 NOTE — ED Notes (Signed)
Called [facility] to transport patient to [facility] Rm# [phone]

## 2023-07-05 NOTE — Anesthesia Preprocedure Evaluation (Signed)
 Anesthesia Evaluation  Patient identified by MRN, date of birth, ID band Patient awake    Reviewed: Allergy & Precautions, NPO status , Patient's Chart, lab work & pertinent test results, reviewed documented beta blocker date and time   History of Anesthesia Complications Negative for: history of anesthetic complications  Airway Mallampati: II  TM Distance: >3 FB Neck ROM: Full    Dental no notable dental hx.    Pulmonary neg COPD, neg PE   breath sounds clear to auscultation       Cardiovascular (-) hypertension(-) angina (-) CAD and (-) Past MI  Rhythm:Regular Rate:Normal     Neuro/Psych  Headaches CVA, Residual Symptoms    GI/Hepatic ,neg GERD  ,,(+) neg Cirrhosis        Endo/Other    Class 4 obesity  Renal/GU Renal disease     Musculoskeletal   Abdominal   Peds  Hematology  (+) Blood dyscrasia   Anesthesia Other Findings   Reproductive/Obstetrics                              Anesthesia Physical Anesthesia Plan  ASA: 3  Anesthesia Plan: General   Post-op Pain Management:    Induction: Intravenous  PONV Risk Score and Plan: 2 and Ondansetron   Airway Management Planned: LMA  Additional Equipment:   Intra-op Plan:   Post-operative Plan: Extubation in OR  Informed Consent: I have reviewed the patients History and Physical, chart, labs and discussed the procedure including the risks, benefits and alternatives for the proposed anesthesia with the patient or authorized representative who has indicated his/her understanding and acceptance.     Dental advisory given  Plan Discussed with: CRNA  Anesthesia Plan Comments:          Anesthesia Quick Evaluation

## 2023-07-05 NOTE — H&P (Signed)
 History and Physical    NARJIS MIRA FMW:[id number] DOB: Jul 30, 1970 DOA: 07/04/2023  PCP: Katheen Roselie Rockford, NP   Chief Complaint: Left buttock pain x 5 days  HPI: Renee Hahn is a 52 y.o. female with medical history significant of hyperlipidemia, previous stroke in 2022 with residual speech and word recall delays but otherwise no symptoms, history of iron  deficiency anemia that has resolved.  Patient notes acute left buttock pain on Friday 12/27 with notable fever and chills but did not present to the ER until 12/31 due to worsening pain uncontrolled by over-the-counter's.  CT abdomen pelvis at prior facility shows subcutaneous air with inflammatory changes at the medial aspect of the left buttock concerning for abscess.  Hospitalist called for admission, general surgery was consulted,   Review of Systems: As per HPI denies nausea vomiting diarrhea constipation headache shortness of breath or chest pain.   Assessment/Plan Principal Problem:   Cellulitis and abscess of buttock   Cellulitis and abscess of left buttock, acute Sepsis secondary to -Notably tachycardic with leukocytosis and overt source with left gluteal abscess meeting sepsis criteria -General Surgery consulted, appreciate insight recommendations, likely plan for I&D in the next 12 to 24 hours per their schedule -Patient currently n.p.o., if unable to proceed with operation today will feed the patient liquid diet and convert n.p.o. status at midnight today. -Pain currently well-controlled on the low-dose Dilaudid  -Vancomycin  ordered for purulent MRSA coverage given abscess meeting sepsis criteria -de-escalate based on cultures  History of CVA -2022 with mild residual speech and memory deficits -Aspirin  and statin to continue in the morning  History of iron  deficiency, resolved -Patient notes she had previously been on p.o. iron  but this has been discontinued per PCP due to recent labs  Minimally elevated  AST ALT bilirubin -Incidentally noted, not clinically relevant and likely secondary to sepsis pathology -CT abdomen and exam benign for any right upper quadrant abnormalities or pain  DVT prophylaxis: heparin  injection 5,000 Units Start: 07/05/23 1515 Code Status: Full Family Communication: None present Status is: Inpatient  Dispo: The patient is from: Home              Anticipated d/c is to: Home              Anticipated d/c date is: 48 to 72 hours pending surgical course and cultures              Patient currently not medically stable for discharge given ongoing need for IV antibiotics narcotics for pain control and surgical procedure  Consultants:  General surgery, [place]  Procedures:  Potential I&D pending surgical consult   Past Medical History:  Diagnosis Date   BMI 50.0-59.9, adult (HCC) 01/28/2020   Cholelithiasis    Hay fever    Headache    past   Hyperlipidemia    Iron  deficiency anemia    Menorrhagia 12/18/2019   Prediabetes    Stroke (HCC)    Vitamin B12 deficiency 02/27/2021    Past Surgical History:  Procedure Laterality Date   BREAST BIOPSY     BREAST EXCISIONAL BIOPSY Left 12/03/2021   benign   BREAST LUMPECTOMY WITH RADIOACTIVE SEED LOCALIZATION Left 12/03/2021   Procedure: LEFT BREAST LUMPECTOMY WITH RADIOACTIVE SEED LOCALIZATION;  Surgeon: Vanderbilt Ned, MD;  Location: [facility] OR;  Service: General;  Laterality: Left;   CHOLECYSTECTOMY N/A 09/21/2019   Procedure: LAPAROSCOPIC CHOLECYSTECTOMY WITH INTRAOPERATIVE CHOLANGIOGRAM;  Surgeon: Vanderbilt Ned, MD;  Location: [facility];  Service: General;  Laterality:  N/A;   DILITATION & CURRETTAGE/HYSTROSCOPY WITH HYDROTHERMAL ABLATION N/A 11/17/2022   Procedure: DILATATION & CURETTAGE/HYSTEROSCOPY WITH HYDROTHERMAL ABLATION;  Surgeon: Rosalva Sawyer, MD;  Location: [facility];  Service: Gynecology;  Laterality: N/A;   ROBOTIC ASSISTED TOTAL HYSTERECTOMY WITH BILATERAL SALPINGO OOPHERECTOMY N/A 01/11/2023    Procedure: XI ROBOTIC ASSISTED TOTAL HYSTERECTOMY >250g WITH BILATERAL SALPINGO OOPHORECTOMY, MINI LAPAROTOMY;  Surgeon: Eldonna Mays, MD;  Location: [facility];  Service: Gynecology;  Laterality: N/A;   SENTINEL NODE BIOPSY N/A 01/11/2023   Procedure: SENTINEL NODE BIOPSY;  Surgeon: Eldonna Mays, MD;  Location: [facility];  Service: Gynecology;  Laterality: N/A;   THROMBECTOMY  2022     reports that she has never smoked. She has never used smokeless tobacco. She reports current alcohol use. She reports that she does not use drugs.  Allergies  Allergen Reactions   Iodinated Contrast Media Itching    Family History  Problem Relation Age of Onset   Breast cancer Mother    Prostate cancer Mother    Prostate cancer Father    Hypertension Maternal Grandmother    Hypertension Paternal Grandfather    Endometrial cancer Neg Hx    Ovarian cancer Neg Hx    Colon cancer Neg Hx     Prior to Admission medications   Medication Sig Start Date End Date Taking? Authorizing Provider  acetaminophen  (TYLENOL ) 650 MG CR tablet Take 650-1,300 mg by mouth every 8 (eight) hours as needed for pain.   Yes [provider]  aspirin  EC 81 MG tablet Take 1 tablet (81 mg total) by mouth daily. Swallow whole. 12/02/22  Yes Nche, Roselie Rockford, NP  atorvastatin  (LIPITOR) 80 MG tablet Take 1 tablet (80 mg total) by mouth daily. 06/16/23  Yes Nche, Roselie Rockford, NP  ferrous gluconate  (FERGON) 324 MG tablet Take 324 mg by mouth 2 (two) times daily with a meal.   Yes [provider]  Sodium Fluoride (FLUORIDEX DAILY DEFENSE DT)  08/05/22  Yes [provider]  trolamine salicylate (ASPERCREME) 10 % cream Apply 1 Application topically as needed for muscle pain. *as needed for legs*   Yes [provider]    Physical Exam: Vitals:   07/05/23 0942 07/05/23 1000 07/05/23 1030 07/05/23 1315  BP:  117/74 127/80 138/75  Pulse:  (!) 105 (!) 103 (!) 127  Resp:   20 (!) 22  Temp: 100.3 F  (37.9 C)   99 F (37.2 C)  TempSrc: Oral   Oral  SpO2:  94% 98% 99%  Weight:        Constitutional: NAD, calm, comfortable Vitals:   07/05/23 0942 07/05/23 1000 07/05/23 1030 07/05/23 1315  BP:  117/74 127/80 138/75  Pulse:  (!) 105 (!) 103 (!) 127  Resp:   20 (!) 22  Temp: 100.3 F (37.9 C)   99 F (37.2 C)  TempSrc: Oral   Oral  SpO2:  94% 98% 99%  Weight:       General:  Pleasantly resting in bed, No acute distress. HEENT:  Normocephalic atraumatic.  Sclerae nonicteric, noninjected.  Extraocular movements intact bilaterally. Neck:  Without mass or deformity.  Trachea is midline. Lungs:  Clear to auscultate bilaterally without rhonchi, wheeze, or rales. Heart:  Regular rate and rhythm.  Without murmurs, rubs, or gallops. Abdomen:  Soft, nontender, obese, nondistended.  Without guarding or rebound. Extremities: Without cyanosis, clubbing, edema. Vascular:  Dorsalis pedis and posterior tibial pulses palpable bilaterally.  Labs on Admission: I have personally reviewed following labs  and imaging studies  CBC: Recent Labs  Lab 07/04/23 1706  WBC 21.4*  NEUTROABS 18.7*  HGB 12.8  HCT 40.4  MCV 75.8*  PLT 281   Basic Metabolic Panel: Recent Labs  Lab 07/04/23 1706  NA 137  K 3.9  CL 101  CO2 24  GLUCOSE 104*  BUN 12  CREATININE 0.89  CALCIUM  9.6   GFR: Estimated Creatinine Clearance: 89.9 mL/min (by C-G formula based on SCr of 0.89 mg/dL).  Liver Function Tests: Recent Labs  Lab 07/04/23 1706  AST 56*  ALT 47*  ALKPHOS 122  BILITOT 1.5*  PROT 8.1  ALBUMIN 4.0   Urine analysis:    Component Value Date/Time   COLORURINE YELLOW 07/04/2023 1800   APPEARANCEUR HAZY (A) 07/04/2023 1800   LABSPEC 1.021 07/04/2023 1800   PHURINE 5.5 07/04/2023 1800   GLUCOSEU NEGATIVE 07/04/2023 1800   HGBUR SMALL (A) 07/04/2023 1800   BILIRUBINUR NEGATIVE 07/04/2023 1800   KETONESUR TRACE (A) 07/04/2023 1800   PROTEINUR TRACE (A) 07/04/2023 1800   NITRITE NEGATIVE  07/04/2023 1800   LEUKOCYTESUR NEGATIVE 07/04/2023 1800    Radiological Exams on Admission: CT PELVIS W CONTRAST Result Date: 07/04/2023 CLINICAL DATA:  Soft tissue inflammatory change between the buttocks, initial encounter EXAM: CT PELVIS WITH CONTRAST TECHNIQUE: Multidetector CT imaging of the pelvis was performed using the standard protocol following the bolus administration of intravenous contrast. RADIATION DOSE REDUCTION: This exam was performed according to the departmental dose-optimization program which includes automated exposure control, adjustment of the mA and/or kV according to patient size and/or use of iterative reconstruction technique. CONTRAST:  80mL OMNIPAQUE  IOHEXOL  300 MG/ML  SOLN COMPARISON:  None Available. FINDINGS: Urinary Tract: Bladder is decompressed. Ureteral dilatation is seen. Visualized kidneys are unremarkable. Bowel:  Bowel as visualized is within normal limits. Vascular/Lymphatic: No vascular abnormality is noted. No lymphadenopathy is seen. Reproductive:  Uterus and ovaries have been surgically removed. Other:  No free fluid is noted. Musculoskeletal: No acute bony abnormality is seen. Subcutaneous inflammatory changes noted in the left buttock with both fluid and air identified. The fluid collection measures approximately 2.9 x 1.6 cm deep along the intergluteal cleft on the left. The air collection is posterior to this along the intergluteal cleft. No other fluid abnormality is seen. IMPRESSION: Subcutaneous air, inflammatory change and small fluid collection along the medial aspect of the left buttock adjacent to the intergluteal cleft. This is consistent with abscess formation. Electronically Signed   By: Oneil Devonshire M.D.   On: 07/04/2023 21:49    Elsie JAYSON Montclair DO [facility] For contact please use secure messenger on Epic  If 7PM-7AM, please contact night-coverage located on [email or web address]   07/05/2023, 2:11 PM

## 2023-07-05 NOTE — Anesthesia Procedure Notes (Signed)
 Procedure Name: LMA Insertion Date/Time: 07/05/2023 4:10 PM  Performed by: Judythe Tanda Aran, CRNAPre-anesthesia Checklist: Emergency Drugs available, Patient identified, Suction available and Patient being monitored Patient Re-evaluated:Patient Re-evaluated prior to induction Oxygen Delivery Method: Circle system utilized Preoxygenation: Pre-oxygenation with 100% oxygen Induction Type: IV induction LMA: LMA inserted LMA Size: 4.0 Number of attempts: 1 Placement Confirmation: positive ETCO2 and breath sounds checked- equal and bilateral Tube secured with: Tape Dental Injury: Teeth and Oropharynx as per pre-operative assessment

## 2023-07-05 NOTE — Transfer of Care (Signed)
 Immediate Anesthesia Transfer of Care Note  Patient: Renee Hahn  Procedure(s) Performed: IRRIGATION AND DEBRIDEMENT LEFT BUTTOCKS (Left)  Patient Location: PACU  Anesthesia Type:General  Level of Consciousness: awake and patient cooperative  Airway & Oxygen Therapy: Patient Spontanous Breathing and Patient connected to face mask  Post-op Assessment: Report given to RN and Post -op Vital signs reviewed and stable  Post vital signs: Reviewed and stable  Last Vitals:  Vitals Value Taken Time  BP 127/78 07/05/23 1648  Temp    Pulse 121 07/05/23 1650  Resp 22 07/05/23 1650  SpO2 100 % 07/05/23 1650  Vitals shown include unfiled device data.  Last Pain:  Vitals:   07/05/23 1510  TempSrc:   PainSc: 5       Patients Stated Pain Goal: 2 (07/05/23 1315)  Complications: No notable events documented.

## 2023-07-05 NOTE — Progress Notes (Signed)
 Pharmacy Antibiotic Note  Renee Hahn is a 52 y.o. female admitted on 07/04/2023 with cellulitis.  Pharmacy has been consulted for IV vancomycin  dosing for 7 days duration.  12/31 Initial dose of vancomycin  2 g IV x 1 ordered  Plan: Continue vancomycin  1500 mg IV every 24 hours (Goal AUC 400-550, eAUC 515.0, SCr: 0.89) for 7 days Monitor clinical progress, renal function, vancomycin  levels as indicated F/U C&S, abx deescalation / LOT   Height: 4' 11 (149.9 cm) Weight: 123.4 kg (272 lb) IBW/kg (Calculated) : 43.2  Temp (24hrs), Avg:100 F (37.8 C), Min:98.7 F (37.1 C), Max:102.2 F (39 C)  Recent Labs  Lab 07/04/23 1706 07/04/23 1853  WBC 21.4*  --   CREATININE 0.89  --   LATICACIDVEN 0.9 1.0    Estimated Creatinine Clearance: 89.9 mL/min (by C-G formula based on SCr of 0.89 mg/dL).    Allergies  Allergen Reactions   Iodinated Contrast Media Itching    Antimicrobials this admission: 12/30 Rocephin  and Flagyl  x 1 12/31 vancomycin  >>   Microbiology results: 12/30 BCx: pending  Thank you for allowing pharmacy to be a part of this patient's care.  Eleanor Agent, PharmD, BCPS 07/05/2023 2:34 PM

## 2023-07-05 NOTE — Consult Note (Signed)
 CC: buttock pain  Requesting provider: Dr Lue  HPI: Renee Hahn is an 52 y.o. female who is here for L buttock pain that started last Fri.  It began to worsen and she presented to the ED last night.  WBC was markedly elevated and CT showed a perirectal abscess tracking to the L buttock  Past Medical History:  Diagnosis Date   BMI 50.0-59.9, adult (HCC) 01/28/2020   Cholelithiasis    Hay fever    Headache    past   Hyperlipidemia    Iron  deficiency anemia    Menorrhagia 12/18/2019   Prediabetes    Stroke (HCC)    Vitamin B12 deficiency 02/27/2021    Past Surgical History:  Procedure Laterality Date   BREAST BIOPSY     BREAST EXCISIONAL BIOPSY Left 12/03/2021   benign   BREAST LUMPECTOMY WITH RADIOACTIVE SEED LOCALIZATION Left 12/03/2021   Procedure: LEFT BREAST LUMPECTOMY WITH RADIOACTIVE SEED LOCALIZATION;  Surgeon: Vanderbilt Ned, MD;  Location: [facility] OR;  Service: General;  Laterality: Left;   CHOLECYSTECTOMY N/A 09/21/2019   Procedure: LAPAROSCOPIC CHOLECYSTECTOMY WITH INTRAOPERATIVE CHOLANGIOGRAM;  Surgeon: Vanderbilt Ned, MD;  Location: [facility] ORS;  Service: General;  Laterality: N/A;   DILITATION & CURRETTAGE/HYSTROSCOPY WITH HYDROTHERMAL ABLATION N/A 11/17/2022   Procedure: DILATATION & CURETTAGE/HYSTEROSCOPY WITH HYDROTHERMAL ABLATION;  Surgeon: Rosalva Sawyer, MD;  Location: [facility] OR;  Service: Gynecology;  Laterality: N/A;   ROBOTIC ASSISTED TOTAL HYSTERECTOMY WITH BILATERAL SALPINGO OOPHERECTOMY N/A 01/11/2023   Procedure: XI ROBOTIC ASSISTED TOTAL HYSTERECTOMY >250g WITH BILATERAL SALPINGO OOPHORECTOMY, MINI LAPAROTOMY;  Surgeon: Eldonna Mays, MD;  Location: [facility]S;  Service: Gynecology;  Laterality: N/A;   SENTINEL NODE BIOPSY N/A 01/11/2023   Procedure: SENTINEL NODE BIOPSY;  Surgeon: Eldonna Mays, MD;  Location: [facility];  Service: Gynecology;  Laterality: N/A;   THROMBECTOMY  2022    Family History  Problem Relation Age of Onset   Breast cancer  Mother    Prostate cancer Mother    Prostate cancer Father    Hypertension Maternal Grandmother    Hypertension Paternal Grandfather    Endometrial cancer Neg Hx    Ovarian cancer Neg Hx    Colon cancer Neg Hx     Social:  reports that she has never smoked. She has never used smokeless tobacco. She reports current alcohol use. She reports that she does not use drugs.  Allergies:  Allergies  Allergen Reactions   Iodinated Contrast Media Itching    Medications: I have reviewed the patient's current medications.  Results for orders placed or performed during the hospital encounter of 07/04/23 (from the past 48 hours)  Lactic acid, plasma     Status: None   Collection Time: 07/04/23  5:06 PM  Result Value Ref Range   Lactic Acid, Venous 0.9 0.5 - 1.9 mmol/L    Comment: Performed at [facility], [address], [place], [place] [address]  Comprehensive metabolic panel     Status: Abnormal   Collection Time: 07/04/23  5:06 PM  Result Value Ref Range   Sodium 137 135 - 145 mmol/L   Potassium 3.9 3.5 - 5.1 mmol/L   Chloride 101 98 - 111 mmol/L   CO2 24 22 - 32 mmol/L   Glucose, Bld 104 (H) 70 - 99 mg/dL    Comment: Glucose reference range applies only to samples taken after fasting for at least 8 hours.   BUN 12 6 - 20 mg/dL   Creatinine, Ser 9.10 0.44 -  1.00 mg/dL   Calcium  9.6 8.9 - 10.3 mg/dL   Total Protein 8.1 6.5 - 8.1 g/dL   Albumin 4.0 3.5 - 5.0 g/dL   AST 56 (H) 15 - 41 U/L   ALT 47 (H) 0 - 44 U/L   Alkaline Phosphatase 122 38 - 126 U/L   Total Bilirubin 1.5 (H) 0.0 - 1.2 mg/dL   GFR, Estimated >39 >39 mL/min    Comment: (NOTE) Calculated using the CKD-EPI Creatinine Equation (2021)    Anion gap 12 5 - 15    Comment: Performed at [facility], [address] [place], [place] [address]  CBC with Differential     Status: Abnormal   Collection Time: 07/04/23  5:06 PM  Result Value Ref Range   WBC 21.4 (H) 4.0 - 10.5 K/uL    RBC 5.33 (H) 3.87 - 5.11 MIL/uL   Hemoglobin 12.8 12.0 - 15.0 g/dL   HCT 59.5 63.9 - 53.9 %   MCV 75.8 (L) 80.0 - 100.0 fL   MCH 24.0 (L) 26.0 - 34.0 pg   MCHC 31.7 30.0 - 36.0 g/dL   RDW 83.8 (H) 88.4 - 84.4 %   Platelets 281 150 - 400 K/uL   nRBC 0.0 0.0 - 0.2 %   Neutrophils Relative % 87 %   Neutro Abs 18.7 (H) 1.7 - 7.7 K/uL   Lymphocytes Relative 7 %   Lymphs Abs 1.5 0.7 - 4.0 K/uL   Monocytes Relative 5 %   Monocytes Absolute 1.0 0.1 - 1.0 K/uL   Eosinophils Relative 0 %   Eosinophils Absolute 0.0 0.0 - 0.5 K/uL   Basophils Relative 0 %   Basophils Absolute 0.0 0.0 - 0.1 K/uL   Immature Granulocytes 1 %   Abs Immature Granulocytes 0.15 (H) 0.00 - 0.07 K/uL    Comment: Performed at [facility], [address], [place], [place] [address]  Urinalysis, w/ Reflex to Culture (Infection Suspected) -Urine, Clean Catch     Status: Abnormal   Collection Time: 07/04/23  6:00 PM  Result Value Ref Range   Specimen Source URINE, CLEAN CATCH    Color, Urine YELLOW YELLOW   APPearance HAZY (A) CLEAR   Specific Gravity, Urine 1.021 1.005 - 1.030   pH 5.5 5.0 - 8.0   Glucose, UA NEGATIVE NEGATIVE mg/dL   Hgb urine dipstick SMALL (A) NEGATIVE   Bilirubin Urine NEGATIVE NEGATIVE   Ketones, ur TRACE (A) NEGATIVE mg/dL   Protein, ur TRACE (A) NEGATIVE mg/dL   Nitrite NEGATIVE NEGATIVE   Leukocytes,Ua NEGATIVE NEGATIVE   RBC / HPF 0-5 0 - 5 RBC/hpf   WBC, UA 0-5 0 - 5 WBC/hpf    Comment:        Reflex urine culture not performed if WBC <=10, OR if Squamous epithelial cells >5. If Squamous epithelial cells >5 suggest recollection.    Bacteria, UA RARE (A) NONE SEEN   Squamous Epithelial / HPF 11-20 0 - 5 /HPF   Mucus PRESENT     Comment: Performed at [facility], [address], [place], [place] [address]  Lactic acid, plasma     Status: None   Collection Time: 07/04/23  6:53 PM  Result Value Ref Range   Lactic Acid, Venous 1.0 0.5 - 1.9  mmol/L    Comment: Performed at [facility], [address] [place], [place] [address]  Culture, blood (Routine X 2) w Reflex to ID Panel     Status: None (Preliminary  result)   Collection Time: 07/04/23 11:39 PM   Specimen: BLOOD RIGHT WRIST  Result Value Ref Range   Specimen Description      BLOOD RIGHT WRIST Performed at [facility], [address], [place], [place] [address]    Special Requests      BOTTLES DRAWN AEROBIC AND ANAEROBIC Blood Culture results may not be optimal due to an excessive volume of blood received in culture bottles Performed at [facility], [address] [place], [place] [address]    Culture PENDING    Report Status PENDING   Culture, blood (Routine X 2) w Reflex to ID Panel     Status: None (Preliminary result)   Collection Time: 07/04/23 11:44 PM   Specimen: BLOOD LEFT ARM  Result Value Ref Range   Specimen Description      BLOOD LEFT ARM Performed at [facility], [address], [place], [place] [address]    Special Requests      BOTTLES DRAWN AEROBIC AND ANAEROBIC Blood Culture adequate volume Performed at [facility], [address], [place], [place] [address]    Culture PENDING    Report Status PENDING     CT PELVIS W CONTRAST Result Date: 07/04/2023 CLINICAL DATA:  Soft tissue inflammatory change between the buttocks, initial encounter EXAM: CT PELVIS WITH CONTRAST TECHNIQUE: Multidetector CT imaging of the pelvis was performed using the standard protocol following the bolus administration of intravenous contrast. RADIATION DOSE REDUCTION: This exam was performed according to the departmental dose-optimization program which includes automated exposure control, adjustment of the mA and/or kV according to patient size and/or use of iterative reconstruction technique. CONTRAST:  80mL OMNIPAQUE  IOHEXOL  300 MG/ML  SOLN COMPARISON:  None Available. FINDINGS: Urinary Tract:  Bladder is decompressed. Ureteral dilatation is seen. Visualized kidneys are unremarkable. Bowel:  Bowel as visualized is within normal limits. Vascular/Lymphatic: No vascular abnormality is noted. No lymphadenopathy is seen. Reproductive:  Uterus and ovaries have been surgically removed. Other:  No free fluid is noted. Musculoskeletal: No acute bony abnormality is seen. Subcutaneous inflammatory changes noted in the left buttock with both fluid and air identified. The fluid collection measures approximately 2.9 x 1.6 cm deep along the intergluteal cleft on the left. The air collection is posterior to this along the intergluteal cleft. No other fluid abnormality is seen. IMPRESSION: Subcutaneous air, inflammatory change and small fluid collection along the medial aspect of the left buttock adjacent to the intergluteal cleft. This is consistent with abscess formation. Electronically Signed   By: Oneil Devonshire M.D.   On: 07/04/2023 21:49    ROS - all of the below systems have been reviewed with the patient and positives are indicated with bold text General: chills, fever or night sweats Eyes: blurry vision or double vision ENT: epistaxis or sore throat Hematologic/Lymphatic: bleeding problems, blood clots or swollen lymph nodes Endocrine: temperature intolerance or unexpected weight changes Resp: cough, shortness of breath, or wheezing CV: chest pain or dyspnea on exertion GI: as per HPI GU: dysuria, trouble voiding, or hematuria Neuro: TIA or stroke symptoms    PE Blood pressure 138/75, pulse (!) 127, temperature 99 F (37.2 C), temperature source Oral, resp. rate (!) 22, height 4' 11 (1.499 m), weight 123.4 kg, last menstrual period 11/17/2022, SpO2 99%. Constitutional: NAD; conversant; no deformities Eyes: Moist conjunctiva; no lid lag; anicteric; PERRL Neck: Trachea midline; no thyromegaly Lungs: Normal respiratory effort CV: RRR GI: Abd soft Rectal: induration of the L  buttock, TTP, no  drainage noted MSK: Normal range of motion of extremities; no clubbing/cyanosis Psychiatric: Appropriate affect; alert and oriented x3  Results for orders placed or performed during the hospital encounter of 07/04/23 (from the past 48 hours)  Lactic acid, plasma     Status: None   Collection Time: 07/04/23  5:06 PM  Result Value Ref Range   Lactic Acid, Venous 0.9 0.5 - 1.9 mmol/L    Comment: Performed at [facility], [address], [place], [place] [address]  Comprehensive metabolic panel     Status: Abnormal   Collection Time: 07/04/23  5:06 PM  Result Value Ref Range   Sodium 137 135 - 145 mmol/L   Potassium 3.9 3.5 - 5.1 mmol/L   Chloride 101 98 - 111 mmol/L   CO2 24 22 - 32 mmol/L   Glucose, Bld 104 (H) 70 - 99 mg/dL    Comment: Glucose reference range applies only to samples taken after fasting for at least 8 hours.   BUN 12 6 - 20 mg/dL   Creatinine, Ser 9.10 0.44 - 1.00 mg/dL   Calcium  9.6 8.9 - 10.3 mg/dL   Total Protein 8.1 6.5 - 8.1 g/dL   Albumin 4.0 3.5 - 5.0 g/dL   AST 56 (H) 15 - 41 U/L   ALT 47 (H) 0 - 44 U/L   Alkaline Phosphatase 122 38 - 126 U/L   Total Bilirubin 1.5 (H) 0.0 - 1.2 mg/dL   GFR, Estimated >39 >39 mL/min    Comment: (NOTE) Calculated using the CKD-EPI Creatinine Equation (2021)    Anion gap 12 5 - 15    Comment: Performed at [facility], [address] [place], [place] [address]  CBC with Differential     Status: Abnormal   Collection Time: 07/04/23  5:06 PM  Result Value Ref Range   WBC 21.4 (H) 4.0 - 10.5 K/uL   RBC 5.33 (H) 3.87 - 5.11 MIL/uL   Hemoglobin 12.8 12.0 - 15.0 g/dL   HCT 59.5 63.9 - 53.9 %   MCV 75.8 (L) 80.0 - 100.0 fL   MCH 24.0 (L) 26.0 - 34.0 pg   MCHC 31.7 30.0 - 36.0 g/dL   RDW 83.8 (H) 88.4 - 84.4 %   Platelets 281 150 - 400 K/uL   nRBC 0.0 0.0 - 0.2 %   Neutrophils Relative % 87 %   Neutro Abs 18.7 (H) 1.7 - 7.7 K/uL   Lymphocytes Relative 7 %   Lymphs Abs 1.5 0.7 -  4.0 K/uL   Monocytes Relative 5 %   Monocytes Absolute 1.0 0.1 - 1.0 K/uL   Eosinophils Relative 0 %   Eosinophils Absolute 0.0 0.0 - 0.5 K/uL   Basophils Relative 0 %   Basophils Absolute 0.0 0.0 - 0.1 K/uL   Immature Granulocytes 1 %   Abs Immature Granulocytes 0.15 (H) 0.00 - 0.07 K/uL    Comment: Performed at [facility], [address] [place], [place] [address]  Urinalysis, w/ Reflex to Culture (Infection Suspected) -Urine, Clean Catch     Status: Abnormal   Collection Time: 07/04/23  6:00 PM  Result Value Ref Range   Specimen Source URINE, CLEAN CATCH    Color, Urine YELLOW YELLOW   APPearance HAZY (A) CLEAR   Specific Gravity, Urine 1.021 1.005 - 1.030   pH 5.5 5.0 - 8.0   Glucose, UA NEGATIVE NEGATIVE mg/dL   Hgb urine dipstick SMALL (A) NEGATIVE   Bilirubin Urine  NEGATIVE NEGATIVE   Ketones, ur TRACE (A) NEGATIVE mg/dL   Protein, ur TRACE (A) NEGATIVE mg/dL   Nitrite NEGATIVE NEGATIVE   Leukocytes,Ua NEGATIVE NEGATIVE   RBC / HPF 0-5 0 - 5 RBC/hpf   WBC, UA 0-5 0 - 5 WBC/hpf    Comment:        Reflex urine culture not performed if WBC <=10, OR if Squamous epithelial cells >5. If Squamous epithelial cells >5 suggest recollection.    Bacteria, UA RARE (A) NONE SEEN   Squamous Epithelial / HPF 11-20 0 - 5 /HPF   Mucus PRESENT     Comment: Performed at [facility], [address], [place], [place] [address]  Lactic acid, plasma     Status: None   Collection Time: 07/04/23  6:53 PM  Result Value Ref Range   Lactic Acid, Venous 1.0 0.5 - 1.9 mmol/L    Comment: Performed at [facility], [address], [place], [place] [address]  Culture, blood (Routine X 2) w Reflex to ID Panel     Status: None (Preliminary result)   Collection Time: 07/04/23 11:39 PM   Specimen: BLOOD RIGHT WRIST  Result Value Ref Range   Specimen Description      BLOOD RIGHT WRIST Performed at [facility], [address] [place], [place] [address]    Special Requests      BOTTLES DRAWN AEROBIC AND ANAEROBIC Blood Culture results may not be optimal due to an excessive volume of blood received in culture bottles Performed at [facility], [address] [place], [place] [address]    Culture PENDING    Report Status PENDING   Culture, blood (Routine X 2) w Reflex to ID Panel     Status: None (Preliminary result)   Collection Time: 07/04/23 11:44 PM   Specimen: BLOOD LEFT ARM  Result Value Ref Range   Specimen Description      BLOOD LEFT ARM Performed at [facility], [address], [place], [place] [address]    Special Requests      BOTTLES DRAWN AEROBIC AND ANAEROBIC Blood Culture adequate volume Performed at [facility], [address] [place], [place] [address]    Culture PENDING    Report Status PENDING     CT PELVIS W CONTRAST Result Date: 07/04/2023 CLINICAL DATA:  Soft tissue inflammatory change between the buttocks, initial encounter EXAM: CT PELVIS WITH CONTRAST TECHNIQUE: Multidetector CT imaging of the pelvis was performed using the standard protocol following the bolus administration of intravenous contrast. RADIATION DOSE REDUCTION: This exam was performed according to the departmental dose-optimization program which includes automated exposure control, adjustment of the mA and/or kV according to patient size and/or use of iterative reconstruction technique. CONTRAST:  80mL OMNIPAQUE  IOHEXOL  300 MG/ML  SOLN COMPARISON:  None Available. FINDINGS: Urinary Tract: Bladder is decompressed. Ureteral dilatation is seen. Visualized kidneys are unremarkable. Bowel:  Bowel as visualized is within normal limits. Vascular/Lymphatic: No vascular abnormality is noted. No lymphadenopathy is seen. Reproductive:  Uterus and ovaries have been surgically removed. Other:  No free fluid is noted. Musculoskeletal: No acute bony abnormality is seen. Subcutaneous  inflammatory changes noted in the left buttock with both fluid and air identified. The fluid collection measures approximately 2.9 x 1.6 cm deep along the intergluteal cleft on the left. The air collection is posterior to this along the intergluteal cleft. No other fluid abnormality is seen. IMPRESSION: Subcutaneous air, inflammatory change and small  fluid collection along the medial aspect of the left buttock adjacent to the intergluteal cleft. This is consistent with abscess formation. Electronically Signed   By: Oneil Devonshire M.D.   On: 07/04/2023 21:49     A/P: ROSELYNNE LORTZ is a morbidly obese 52 y.o. female with perirectal abscess that is deep in nature.  I do not feel that bedside I&D is appropriate and have recommended I&D in OR under sedation.  Pt agrees.  We discussed the open wound she would have after surgery and the possibility of fistula formation.  All questions were answered.  Pt agrees to proceed.   Bernarda JAYSON Ned, MD  Colorectal and General Surgery [facility] Surgery     moderate decision making.

## 2023-07-06 ENCOUNTER — Encounter (HOSPITAL_COMMUNITY): Payer: Self-pay | Admitting: General Surgery

## 2023-07-06 DIAGNOSIS — A419 Sepsis, unspecified organism: Secondary | ICD-10-CM | POA: Diagnosis not present

## 2023-07-06 DIAGNOSIS — L0231 Cutaneous abscess of buttock: Secondary | ICD-10-CM | POA: Diagnosis not present

## 2023-07-06 DIAGNOSIS — L03317 Cellulitis of buttock: Secondary | ICD-10-CM | POA: Diagnosis not present

## 2023-07-06 HISTORY — DX: Sepsis, unspecified organism: A41.9

## 2023-07-06 LAB — BASIC METABOLIC PANEL
Anion gap: 8 (ref 5–15)
BUN: 13 mg/dL (ref 6–20)
CO2: 24 mmol/L (ref 22–32)
Calcium: 8.2 mg/dL — ABNORMAL LOW (ref 8.9–10.3)
Chloride: 101 mmol/L (ref 98–111)
Creatinine, Ser: 0.94 mg/dL (ref 0.44–1.00)
GFR, Estimated: >60 mL/min (ref >60)
Glucose, Bld: 108 mg/dL — ABNORMAL HIGH (ref 70–99)
Potassium: 3.3 mmol/L — ABNORMAL LOW (ref 3.5–5.1)
Sodium: 133 mmol/L — ABNORMAL LOW (ref 135–145)

## 2023-07-06 LAB — CBC
HCT: 35.8 % — ABNORMAL LOW (ref 36.0–46.0)
Hemoglobin: 11.1 g/dL — ABNORMAL LOW (ref 12.0–15.0)
MCH: 24.3 pg — ABNORMAL LOW (ref 26.0–34.0)
MCHC: 31 g/dL (ref 30.0–36.0)
MCV: 78.5 fL — ABNORMAL LOW (ref 80.0–100.0)
Platelets: 228 10*3/uL (ref 150–400)
RBC: 4.56 MIL/uL (ref 3.87–5.11)
RDW: 15.9 % — ABNORMAL HIGH (ref 11.5–15.5)
WBC: 13.5 10*3/uL — ABNORMAL HIGH (ref 4.0–10.5)
nRBC: 0 % (ref 0.0–0.2)

## 2023-07-06 MED ORDER — HYDROMORPHONE HCL 1 MG/ML IJ SOLN
0.5000 mg | INTRAMUSCULAR | Status: DC | PRN
  Administered 2023-07-07: 0.5 mg via INTRAVENOUS
  Filled 2023-07-06: qty 0.5

## 2023-07-06 MED ORDER — PIPERACILLIN-TAZOBACTAM 3.375 G IVPB
3.3750 g | Freq: Three times a day (TID) | INTRAVENOUS | Status: DC
  Administered 2023-07-06 – 2023-07-08 (×7): 3.375 g via INTRAVENOUS
  Filled 2023-07-06 (×7): qty 50

## 2023-07-06 MED ORDER — SENNOSIDES-DOCUSATE SODIUM 8.6-50 MG PO TABS
1.0000 | ORAL_TABLET | Freq: Two times a day (BID) | ORAL | Status: DC
  Administered 2023-07-06 – 2023-07-07 (×3): 1 via ORAL
  Filled 2023-07-06 (×4): qty 1

## 2023-07-06 MED ORDER — POTASSIUM CHLORIDE CRYS ER 20 MEQ PO TBCR
40.0000 meq | EXTENDED_RELEASE_TABLET | Freq: Once | ORAL | Status: AC
Start: 2023-07-06 — End: 2023-07-06
  Administered 2023-07-06: 40 meq via ORAL
  Filled 2023-07-06: qty 2

## 2023-07-06 NOTE — Progress Notes (Signed)
 Patient standing and bending forward 45 degrees in order for tech to clean her buttocks. Going from bending position to standing straight, she became dizzy and leaning forward. She had blank stare. Recovered quickly and was able to answer orientation questions. Assisted patient to the bed.

## 2023-07-06 NOTE — Progress Notes (Signed)
 Performed sitz bath and removed packing at 1721. Patient had BM and performed prn sitz bath at 1806.

## 2023-07-06 NOTE — Progress Notes (Signed)
  Progress Note   Patient: Renee Hahn FMW:[id number] DOB: 11-23-1970 DOA: 07/04/2023     1 DOS: the patient was seen and examined on 07/06/2023   Brief hospital course: *53 year old woman admitted for perirectal abscess.  Status post incision and drainage.  Continue antibiotics.  Assessment and Plan: Sepsis secondary to cellulitis and abscess left buttock. Status post incision and drainage with clinical improvement.  It does not appear that culture was sent.  Continue empiric antibiotics.  Management per surgery.   History of CVA -2022 with mild residual speech and memory deficits -Aspirin  and statin   History of iron  deficiency, resolved   Minimally elevated AST ALT bilirubin -Incidentally noted, not clinically relevant and likely secondary to sepsis pathology -CT abdomen and exam benign for any right upper quadrant abnormalities or pain      Subjective: Feels better  Physical Exam: Vitals:   07/05/23 2333 07/06/23 0256 07/06/23 0629 07/06/23 1054  BP: (!) 90/56 100/65 (!) 87/62 117/87  Pulse: 86 88 83 92  Resp: 18 16 16 20   Temp: 98.2 F (36.8 C) 98.2 F (36.8 C) 98.1 F (36.7 C) 97.6 F (36.4 C)  TempSrc: Oral Oral Oral   SpO2: 98% 100% 100% 98%  Weight:      Height:       Physical Exam Vitals reviewed.  Constitutional:      General: She is not in acute distress.    Appearance: She is not ill-appearing or toxic-appearing.  Cardiovascular:     Rate and Rhythm: Normal rate and regular rhythm.     Heart sounds: No murmur heard. Pulmonary:     Effort: Pulmonary effort is normal. No respiratory distress.     Breath sounds: No wheezing, rhonchi or rales.  Neurological:     Mental Status: She is alert.  Psychiatric:        Behavior: Behavior normal.     Data Reviewed: Potassium 3.3 WBC down to 13.5.  Hemoglobin stable 11.1.  Family Communication: none  Disposition: Status is: Inpatient Remains inpatient appropriate because: perirectal  abscess     Time spent: 20 minutes  Author: Toribio Door, MD 07/06/2023 7:15 PM  For on call review [email or web address].

## 2023-07-06 NOTE — Hospital Course (Addendum)
 53 year old woman admitted for perirectal abscess.  Status post incision and drainage.  Continue antibiotics.  Consultants General surgery  Procedures/Events 12/31 incision and drainage of perirectal abscess

## 2023-07-06 NOTE — Plan of Care (Signed)
  Problem: Clinical Measurements: Goal: Diagnostic test results will improve Outcome: Progressing   Problem: Activity: Goal: Risk for activity intolerance will decrease Outcome: Progressing   Problem: Pain Management: Goal: General experience of comfort will improve Outcome: Progressing   Problem: Safety: Goal: Ability to remain free from injury will improve Outcome: Progressing

## 2023-07-06 NOTE — Progress Notes (Signed)
 1 Day Post-Op I&D Subjective: Feels much better  Objective: Vital signs in last 24 hours: Temp:  [98.1 F (36.7 C)-101.2 F (38.4 C)] 98.1 F (36.7 C) (01/01 0629) Pulse Rate:  [83-127] 83 (01/01 0629) Resp:  [14-29] 16 (01/01 0629) BP: (87-138)/(56-86) 87/62 (01/01 0629) SpO2:  [94 %-100 %] 100 % (01/01 0629) Weight:  [876 kg] 123 kg (12/31 1510)   Intake/Output from previous day: 12/31 0701 - 01/01 0700 In: 1931.8 [P.O.:720; I.V.:631.6; IV Piggyback:580.2] Out: 5 [Blood:5] Intake/Output this shift: No intake/output data recorded.   General appearance: alert and cooperative GI: soft  Incision: no significant drainage  Lab Results:  Recent Labs    07/05/23 1853 07/06/23 0422  WBC 17.4* 13.5*  HGB 11.5* 11.1*  HCT 37.8 35.8*  PLT 258 228   BMET Recent Labs    07/04/23 1706 07/05/23 1853 07/06/23 0422  NA 137  --  133*  K 3.9  --  3.3*  CL 101  --  101  CO2 24  --  24  GLUCOSE 104*  --  108*  BUN 12  --  13  CREATININE 0.89 0.95 0.94  CALCIUM  9.6  --  8.2*   PT/INR No results for input(s): LABPROT, INR in the last 72 hours. ABG No results for input(s): PHART, HCO3 in the last 72 hours.  Invalid input(s): PCO2, PO2  MEDS, Scheduled  heparin   5,000 Units Subcutaneous Q8H    Studies/Results: CT PELVIS W CONTRAST Result Date: 07/04/2023 CLINICAL DATA:  Soft tissue inflammatory change between the buttocks, initial encounter EXAM: CT PELVIS WITH CONTRAST TECHNIQUE: Multidetector CT imaging of the pelvis was performed using the standard protocol following the bolus administration of intravenous contrast. RADIATION DOSE REDUCTION: This exam was performed according to the departmental dose-optimization program which includes automated exposure control, adjustment of the mA and/or kV according to patient size and/or use of iterative reconstruction technique. CONTRAST:  80mL OMNIPAQUE  IOHEXOL  300 MG/ML  SOLN COMPARISON:  None Available. FINDINGS:  Urinary Tract: Bladder is decompressed. Ureteral dilatation is seen. Visualized kidneys are unremarkable. Bowel:  Bowel as visualized is within normal limits. Vascular/Lymphatic: No vascular abnormality is noted. No lymphadenopathy is seen. Reproductive:  Uterus and ovaries have been surgically removed. Other:  No free fluid is noted. Musculoskeletal: No acute bony abnormality is seen. Subcutaneous inflammatory changes noted in the left buttock with both fluid and air identified. The fluid collection measures approximately 2.9 x 1.6 cm deep along the intergluteal cleft on the left. The air collection is posterior to this along the intergluteal cleft. No other fluid abnormality is seen. IMPRESSION: Subcutaneous air, inflammatory change and small fluid collection along the medial aspect of the left buttock adjacent to the intergluteal cleft. This is consistent with abscess formation. Electronically Signed   By: Oneil Devonshire M.D.   On: 07/04/2023 21:49    Assessment: s/p Procedure(s): IRRIGATION AND DEBRIDEMENT LEFT BUTTOCKS Patient Active Problem List   Diagnosis Date Noted   Abscess, gluteal, left 07/05/2023   Cellulitis and abscess of buttock 07/04/2023   Mixed hyperlipidemia 06/16/2023   Complex atypical endometrial hyperplasia 01/11/2023   Elevated BP without diagnosis of hypertension 08/16/2022   Prediabetes 08/16/2022   History of cerebrovascular accident (CVA) with residual deficit 03/02/2021   Anemia 02/20/2021   Morbid obesity (HCC) 01/24/2020   Iron  deficiency anemia due to chronic blood loss 12/17/2019   Seborrhea 12/17/2019   S/P laparoscopic cholecystectomy 09/21/2019    S/p I&D perirectal abscess  Plan: Sitz baths TID  Remove packing with 1st sitz bath.  Do not need to repack Ok for diet as tolerated Stool softeners BID Abx x5 days   LOS: 1 day     .Bernarda JAYSON Ned, MD [facility] Surgery, [place]    07/06/2023 8:50 AM

## 2023-07-07 DIAGNOSIS — L0231 Cutaneous abscess of buttock: Secondary | ICD-10-CM | POA: Diagnosis not present

## 2023-07-07 DIAGNOSIS — L03317 Cellulitis of buttock: Secondary | ICD-10-CM | POA: Diagnosis not present

## 2023-07-07 DIAGNOSIS — A419 Sepsis, unspecified organism: Secondary | ICD-10-CM | POA: Diagnosis not present

## 2023-07-07 LAB — CBC
HCT: 35.2 % — ABNORMAL LOW (ref 36.0–46.0)
Hemoglobin: 10.8 g/dL — ABNORMAL LOW (ref 12.0–15.0)
MCH: 24.2 pg — ABNORMAL LOW (ref 26.0–34.0)
MCHC: 30.7 g/dL (ref 30.0–36.0)
MCV: 78.7 fL — ABNORMAL LOW (ref 80.0–100.0)
Platelets: 236 10*3/uL (ref 150–400)
RBC: 4.47 MIL/uL (ref 3.87–5.11)
RDW: 15.8 % — ABNORMAL HIGH (ref 11.5–15.5)
WBC: 10.3 10*3/uL (ref 4.0–10.5)
nRBC: 0 % (ref 0.0–0.2)

## 2023-07-07 LAB — BASIC METABOLIC PANEL
Anion gap: 6 (ref 5–15)
BUN: 13 mg/dL (ref 6–20)
CO2: 25 mmol/L (ref 22–32)
Calcium: 8.3 mg/dL — ABNORMAL LOW (ref 8.9–10.3)
Chloride: 103 mmol/L (ref 98–111)
Creatinine, Ser: 0.98 mg/dL (ref 0.44–1.00)
GFR, Estimated: >60 mL/min (ref >60)
Glucose, Bld: 95 mg/dL (ref 70–99)
Potassium: 4.1 mmol/L (ref 3.5–5.1)
Sodium: 134 mmol/L — ABNORMAL LOW (ref 135–145)

## 2023-07-07 NOTE — TOC CM/SW Note (Signed)
 Transition of Care [facility]) - Inpatient Brief Assessment   Patient Details  Name: Renee Hahn MRN: [id number] Date of Birth: [birth year]  Transition of Care [facility]) CM/SW Contact:    Tawni CHRISTELLA Eva, LCSW Phone Number: 07/07/2023, 2:32 PM   Clinical Narrative:  Transition of Care Department [facility]) has reviewed patient and no TOC needs have been identified at this time. We will continue to monitor patient advancement through interdisciplinary progression rounds. If new patient transition needs arise, please place a TOC consult.  Transition of Care Asessment: Insurance and Status: Insurance coverage has been reviewed Patient has primary care physician: Yes Home environment has been reviewed: home with self Prior level of function:: independent Prior/Current Home Services: No current home services Social Drivers of Health Review: SDOH reviewed no interventions necessary Readmission risk has been reviewed: Yes Transition of care needs: no transition of care needs at this time

## 2023-07-07 NOTE — Progress Notes (Signed)
 2 Days Post-Op I&D Subjective: Had some dizziness with sitz baths and cleaning yesterday  Objective: Vital signs in last 24 hours: Temp:  [97.6 F (36.4 C)-98.3 F (36.8 C)] 97.9 F (36.6 C) (01/02 0358) Pulse Rate:  [79-92] 79 (01/02 0358) Resp:  [16-20] 16 (01/02 0358) BP: (102-117)/(69-87) 102/80 (01/02 0358) SpO2:  [97 %-98 %] 98 % (01/02 0358)   Intake/Output from previous day: 01/01 0701 - 01/02 0700 In: 840 [P.O.:240; IV Piggyback:600] Out: -  Intake/Output this shift: No intake/output data recorded.   General appearance: alert and cooperative GI: soft  Incision: no significant drainage, packing removed  Lab Results:  Recent Labs    07/06/23 0422 07/07/23 0607  WBC 13.5* 10.3  HGB 11.1* 10.8*  HCT 35.8* 35.2*  PLT 228 236   BMET Recent Labs    07/06/23 0422 07/07/23 0607  NA 133* 134*  K 3.3* 4.1  CL 101 103  CO2 24 25  GLUCOSE 108* 95  BUN 13 13  CREATININE 0.94 0.98  CALCIUM  8.2* 8.3*   PT/INR No results for input(s): LABPROT, INR in the last 72 hours. ABG No results for input(s): PHART, HCO3 in the last 72 hours.  Invalid input(s): PCO2, PO2  MEDS, Scheduled  heparin   5,000 Units Subcutaneous Q8H   senna-docusate  1 tablet Oral BID    Studies/Results: No results found.   Assessment: s/p Procedure(s): IRRIGATION AND DEBRIDEMENT LEFT BUTTOCKS Patient Active Problem List   Diagnosis Date Noted   Sepsis (HCC) 07/06/2023   Abscess, gluteal, left 07/05/2023   Cellulitis and abscess of buttock 07/04/2023   Mixed hyperlipidemia 06/16/2023   Complex atypical endometrial hyperplasia 01/11/2023   Elevated BP without diagnosis of hypertension 08/16/2022   Prediabetes 08/16/2022   History of cerebrovascular accident (CVA) with residual deficit 03/02/2021   Anemia 02/20/2021   Morbid obesity (HCC) 01/24/2020   Iron  deficiency anemia due to chronic blood loss 12/17/2019   Seborrhea 12/17/2019   S/P laparoscopic  cholecystectomy 09/21/2019    S/p I&D perirectal abscess  Plan: Sitz baths TID and after BMs Ok for diet as tolerated Stool softeners BID Abx x5 days Pt still having some pain issues, would recommend holding off on d/c until this improves   LOS: 2 days     .Bernarda JAYSON Ned, MD [facility], [place]    07/07/2023 8:36 AM

## 2023-07-07 NOTE — Progress Notes (Signed)
  Progress Note   Patient: Renee Hahn FMW:[id number] DOB: 1970-12-11 DOA: 07/04/2023     2 DOS: the patient was seen and examined on 07/07/2023   Brief hospital course: *53 year old woman admitted for perirectal abscess.  Status post incision and drainage.  Continue antibiotics.  Assessment and Plan: Sepsis secondary to cellulitis and abscess left buttock. Status post incision and drainage with clinical improvement.  It does not appear that culture was sent.  Continue empiric antibiotics.  Management per surgery including sitz bath's 3 times daily and after BMs.  Stool softeners.  Antibiotics for 5 days.  Hold off on discharge per surgery.   History of CVA -2022 with mild residual speech and memory deficits -Aspirin  and statin   History of iron  deficiency, resolved   Minimally elevated AST ALT bilirubin -Incidentally noted, not clinically relevant and likely secondary to sepsis pathology -CT abdomen and exam benign for any right upper quadrant abnormalities or pain      Subjective:  Feeling better  Physical Exam: Vitals:   07/06/23 0629 07/06/23 1054 07/06/23 2131 07/07/23 0358  BP: (!) 87/62 117/87 117/69 102/80  Pulse: 83 92 88 79  Resp: 16 20 20 16   Temp: 98.1 F (36.7 C) 97.6 F (36.4 C) 98.3 F (36.8 C) 97.9 F (36.6 C)  TempSrc: Oral  Oral Oral  SpO2: 100% 98% 97% 98%  Weight:      Height:       Physical Exam Vitals reviewed.  Constitutional:      General: She is not in acute distress.    Appearance: She is not ill-appearing or toxic-appearing.  Cardiovascular:     Rate and Rhythm: Normal rate and regular rhythm.     Heart sounds: No murmur heard. Pulmonary:     Effort: Pulmonary effort is normal. No respiratory distress.     Breath sounds: No wheezing, rhonchi or rales.  Neurological:     Mental Status: She is alert.  Psychiatric:        Mood and Affect: Mood normal.        Behavior: Behavior normal.     Data Reviewed: BMP stable WBC down  to 10.3 Hgb stable 10.8  Family Communication: none  Disposition: Status is: Inpatient Remains inpatient appropriate because: perirectal abscess     Time spent: 20 minutes  Author: Toribio Door, MD 07/07/2023 10:04 AM  For on call review [email or web address].

## 2023-07-08 DIAGNOSIS — L03317 Cellulitis of buttock: Secondary | ICD-10-CM | POA: Diagnosis not present

## 2023-07-08 DIAGNOSIS — L0231 Cutaneous abscess of buttock: Secondary | ICD-10-CM | POA: Diagnosis not present

## 2023-07-08 LAB — BASIC METABOLIC PANEL WITH GFR
Anion gap: 9 (ref 5–15)
BUN: 15 mg/dL (ref 6–20)
CO2: 25 mmol/L (ref 22–32)
Calcium: 8.5 mg/dL — ABNORMAL LOW (ref 8.9–10.3)
Chloride: 106 mmol/L (ref 98–111)
Creatinine, Ser: 0.99 mg/dL (ref 0.44–1.00)
GFR, Estimated: >60 mL/min
Glucose, Bld: 94 mg/dL (ref 70–99)
Potassium: 3.6 mmol/L (ref 3.5–5.1)
Sodium: 140 mmol/L (ref 135–145)

## 2023-07-08 LAB — CBC
HCT: 31.9 % — ABNORMAL LOW (ref 36.0–46.0)
Hemoglobin: 10 g/dL — ABNORMAL LOW (ref 12.0–15.0)
MCH: 24.6 pg — ABNORMAL LOW (ref 26.0–34.0)
MCHC: 31.3 g/dL (ref 30.0–36.0)
MCV: 78.6 fL — ABNORMAL LOW (ref 80.0–100.0)
Platelets: 275 10*3/uL (ref 150–400)
RBC: 4.06 MIL/uL (ref 3.87–5.11)
RDW: 16 % — ABNORMAL HIGH (ref 11.5–15.5)
WBC: 10.9 10*3/uL — ABNORMAL HIGH (ref 4.0–10.5)
nRBC: 0 % (ref 0.0–0.2)

## 2023-07-08 MED ORDER — POLYETHYLENE GLYCOL 3350 17 G PO PACK: 17.0000 g | PACK | Freq: Every day | ORAL | 0 refills | Status: DC | PRN

## 2023-07-08 MED ORDER — HYDROCODONE-ACETAMINOPHEN 5-325 MG PO TABS: 1.0000 | ORAL_TABLET | Freq: Four times a day (QID) | ORAL | 0 refills | Status: AC | PRN

## 2023-07-08 MED ORDER — AMOXICILLIN-POT CLAVULANATE 875-125 MG PO TABS: 1.0000 | ORAL_TABLET | Freq: Two times a day (BID) | ORAL | 0 refills | Status: AC

## 2023-07-08 MED ORDER — SENNOSIDES-DOCUSATE SODIUM 8.6-50 MG PO TABS: 1.0000 | ORAL_TABLET | Freq: Two times a day (BID) | ORAL | 0 refills | Status: DC

## 2023-07-08 NOTE — Plan of Care (Signed)
  Problem: Education: Goal: Knowledge of General Education information will improve Description: Including pain rating scale, medication(s)/side effects and non-pharmacologic comfort measures Outcome: Adequate for Discharge   Problem: Health Behavior/Discharge Planning: Goal: Ability to manage health-related needs will improve Outcome: Adequate for Discharge   Problem: Clinical Measurements: Goal: Ability to maintain clinical measurements within normal limits will improve Outcome: Progressing Goal: Will remain free from infection Outcome: Progressing Goal: Diagnostic test results will improve Outcome: Progressing Goal: Respiratory complications will improve Outcome: Progressing Goal: Cardiovascular complication will be avoided Outcome: Progressing   Problem: Activity: Goal: Risk for activity intolerance will decrease Outcome: Adequate for Discharge   Problem: Nutrition: Goal: Adequate nutrition will be maintained Outcome: Completed/Met   Problem: Coping: Goal: Level of anxiety will decrease Outcome: Progressing   Problem: Elimination: Goal: Will not experience complications related to bowel motility Outcome: Completed/Met Goal: Will not experience complications related to urinary retention Outcome: Completed/Met   Problem: Pain Management: Goal: General experience of comfort will improve Outcome: Adequate for Discharge   Problem: Safety: Goal: Ability to remain free from injury will improve Outcome: Progressing   Problem: Skin Integrity: Goal: Risk for impaired skin integrity will decrease Outcome: Progressing

## 2023-07-08 NOTE — Discharge Instructions (Signed)
 Keep dry gauze over the wound to protect clothing from drainage. Sitz baths three times daily and after bowel movements.

## 2023-07-08 NOTE — Plan of Care (Signed)
  Problem: Education: Goal: Knowledge of General Education information will improve Description: Including pain rating scale, medication(s)/side effects and non-pharmacologic comfort measures Outcome: Adequate for Discharge   Problem: Health Behavior/Discharge Planning: Goal: Ability to manage health-related needs will improve Outcome: Adequate for Discharge   Problem: Clinical Measurements: Goal: Ability to maintain clinical measurements within normal limits will improve Outcome: Adequate for Discharge Goal: Will remain free from infection Outcome: Adequate for Discharge Goal: Diagnostic test results will improve Outcome: Adequate for Discharge Goal: Respiratory complications will improve Outcome: Adequate for Discharge Goal: Cardiovascular complication will be avoided Outcome: Adequate for Discharge   Problem: Activity: Goal: Risk for activity intolerance will decrease Outcome: Adequate for Discharge   Problem: Coping: Goal: Level of anxiety will decrease Outcome: Adequate for Discharge   Problem: Pain Management: Goal: General experience of comfort will improve Outcome: Adequate for Discharge   Problem: Safety: Goal: Ability to remain free from injury will improve Outcome: Adequate for Discharge   Problem: Skin Integrity: Goal: Risk for impaired skin integrity will decrease Outcome: Adequate for Discharge

## 2023-07-08 NOTE — Progress Notes (Signed)
 3 Days Post-Op I&D Subjective: Feeling much better today. No further dizziness. Pain controlled.  Objective: Vital signs in last 24 hours: Temp:  [98.1 F (36.7 C)-98.6 F (37 C)] 98.1 F (36.7 C) (01/03 0452) Pulse Rate:  [73-93] 73 (01/03 0452) Resp:  [15-16] 15 (01/03 0452) BP: (97-111)/(67-71) 97/71 (01/03 0452) SpO2:  [97 %-99 %] 97 % (01/03 0452)   Intake/Output from previous day: 01/02 0701 - 01/03 0700 In: 960.1 [P.O.:360; IV Piggyback:600.1] Out: -  Intake/Output this shift: No intake/output data recorded.   General appearance: alert and cooperative GI: soft Incision: minimal drainage, wound clean, mild surrounding induration  Lab Results:  Recent Labs    07/07/23 0607 07/08/23 0319  WBC 10.3 10.9*  HGB 10.8* 10.0*  HCT 35.2* 31.9*  PLT 236 275   BMET Recent Labs    07/07/23 0607 07/08/23 0319  NA 134* 140  K 4.1 3.6  CL 103 106  CO2 25 25  GLUCOSE 95 94  BUN 13 15  CREATININE 0.98 0.99  CALCIUM  8.3* 8.5*   PT/INR No results for input(s): LABPROT, INR in the last 72 hours. ABG No results for input(s): PHART, HCO3 in the last 72 hours.  Invalid input(s): PCO2, PO2  MEDS, Scheduled  heparin   5,000 Units Subcutaneous Q8H   senna-docusate  1 tablet Oral BID    Studies/Results: No results found.   Assessment: s/p Procedure(s): IRRIGATION AND DEBRIDEMENT LEFT BUTTOCKS Patient Active Problem List   Diagnosis Date Noted   Sepsis (HCC) 07/06/2023   Abscess, gluteal, left 07/05/2023   Cellulitis and abscess of buttock 07/04/2023   Mixed hyperlipidemia 06/16/2023   Complex atypical endometrial hyperplasia 01/11/2023   Elevated BP without diagnosis of hypertension 08/16/2022   Prediabetes 08/16/2022   History of cerebrovascular accident (CVA) with residual deficit 03/02/2021   Anemia 02/20/2021   Morbid obesity (HCC) 01/24/2020   Iron  deficiency anemia due to chronic blood loss 12/17/2019   Seborrhea 12/17/2019   S/P  laparoscopic cholecystectomy 09/21/2019    S/p I&D perirectal abscess  Plan: Sitz baths TID and after BMs Ok for diet as tolerated Stool softeners BID Abx x5 days Ok for discharge from surgery standpoint   LOS: 3 days     Renee DELENA Freund  MD [facility], [place]    07/08/2023 9:48 AM

## 2023-07-08 NOTE — Discharge Summary (Signed)
 Physician Discharge Summary   Patient: Renee Hahn MRN: [id number] DOB: December 21, 1970  Admit date:     07/04/2023  Discharge date: 07/08/23  Discharge Physician: Toribio Door   PCP: Katheen Roselie Rockford, NP   Recommendations at discharge:  Resolution of perirectal abscess.  Discharge Diagnoses: Principal Problem:   Cellulitis and abscess of buttock Active Problems:   Abscess, gluteal, left   Sepsis (HCC)  Resolved Problems:   * No resolved hospital problems. *  Hospital Course: 53 year old woman admitted for perirectal abscess.  Status post incision and drainage.  Condition gradually improved.  Discharged home in good condition.  Consultants General surgery  Procedures/Events 12/31 incision and drainage of perirectal abscess  Assessment and Plan: Sepsis secondary to cellulitis and abscess left buttock. Status post incision and drainage with clinical improvement.  It does not appear that culture was sent.  Management per surgery including sitz bath's 3 times daily and after BMs.  Stool softeners.  Antibiotics for 5 days.     History of CVA -2022 with mild residual speech and memory deficits -Aspirin  and statin   History of iron  deficiency, resolved   Minimally elevated AST ALT bilirubin -Incidentally noted, not clinically relevant and likely secondary to sepsis pathology -CT abdomen and exam benign for any right upper quadrant abnormalities or pain  Disposition: Home Diet recommendation:  Diet Orders (From admission, onward)     Start     Ordered   07/08/23 0000  Diet - low sodium heart healthy        07/08/23 1323   07/05/23 1841  Diet regular Fluid consistency: Thin  Diet effective now       Question:  Fluid consistency:  Answer:  Thin   07/05/23 1840            DISCHARGE MEDICATION: Allergies as of 07/08/2023       Reactions   Iodinated Contrast Media Itching        Medication List     TAKE these medications    acetaminophen  650 MG CR  tablet Commonly known as: TYLENOL  Take 650-1,300 mg by mouth every 8 (eight) hours as needed for pain.   amoxicillin -clavulanate 875-125 MG tablet Commonly known as: AUGMENTIN  Take 1 tablet by mouth 2 (two) times daily for 5 days.   aspirin  EC 81 MG tablet Take 1 tablet (81 mg total) by mouth daily. Swallow whole.   atorvastatin  80 MG tablet Commonly known as: LIPITOR Take 1 tablet (80 mg total) by mouth daily.   ferrous gluconate  324 MG tablet Commonly known as: FERGON Take 324 mg by mouth 2 (two) times daily with a meal.   FLUORIDEX DAILY DEFENSE DT   HYDROcodone -acetaminophen  5-325 MG tablet Commonly known as: NORCO/VICODIN Take 1-2 tablets by mouth every 6 (six) hours as needed for up to 5 days for severe pain (pain score 7-10).   polyethylene glycol 17 g packet Commonly known as: MiraLax  Take 17 g by mouth daily as needed for mild constipation.   senna-docusate 8.6-50 MG tablet Commonly known as: Senokot-S Take 1 tablet by mouth 2 (two) times daily.   trolamine salicylate 10 % cream Commonly known as: ASPERCREME Apply 1 Application topically as needed for muscle pain. *as needed for legs*               Discharge Care Instructions  (From admission, onward)           Start     Ordered   07/08/23 0000  Discharge wound care:  Comments: Sitz baths three times daily and after bowel movements   07/08/23 1323            Follow-up Information     Debby Hila, MD. Schedule an appointment as soon as possible for a visit in 2 week(s).   Specialties: General Surgery, Colon and Rectal Surgery Contact information: [address] [place] [address] [phone]         Katheen Roselie Rockford, NP Follow up in 3 day(s).   Specialty: Internal Medicine Why: diabetes and hypertension management Contact information: [address] [place] [address] [phone]                Feels better  Discharge  Exam: Filed Weights   07/04/23 1706 07/05/23 1510  Weight: 123.4 kg 123 kg   Physical Exam Vitals reviewed.  Constitutional:      General: She is not in acute distress.    Appearance: She is not ill-appearing or toxic-appearing.  Cardiovascular:     Rate and Rhythm: Normal rate and regular rhythm.     Heart sounds: No murmur heard. Pulmonary:     Effort: Pulmonary effort is normal. No respiratory distress.     Breath sounds: No wheezing, rhonchi or rales.  Neurological:     Mental Status: She is alert.  Psychiatric:        Behavior: Behavior normal.      Condition at discharge: good  The results of significant diagnostics from this hospitalization (including imaging, microbiology, ancillary and laboratory) are listed below for reference.   Imaging Studies: CT PELVIS W CONTRAST Result Date: 07/04/2023 CLINICAL DATA:  Soft tissue inflammatory change between the buttocks, initial encounter EXAM: CT PELVIS WITH CONTRAST TECHNIQUE: Multidetector CT imaging of the pelvis was performed using the standard protocol following the bolus administration of intravenous contrast. RADIATION DOSE REDUCTION: This exam was performed according to the departmental dose-optimization program which includes automated exposure control, adjustment of the mA and/or kV according to patient size and/or use of iterative reconstruction technique. CONTRAST:  80mL OMNIPAQUE  IOHEXOL  300 MG/ML  SOLN COMPARISON:  None Available. FINDINGS: Urinary Tract: Bladder is decompressed. Ureteral dilatation is seen. Visualized kidneys are unremarkable. Bowel:  Bowel as visualized is within normal limits. Vascular/Lymphatic: No vascular abnormality is noted. No lymphadenopathy is seen. Reproductive:  Uterus and ovaries have been surgically removed. Other:  No free fluid is noted. Musculoskeletal: No acute bony abnormality is seen. Subcutaneous inflammatory changes noted in the left buttock with both fluid and air identified. The  fluid collection measures approximately 2.9 x 1.6 cm deep along the intergluteal cleft on the left. The air collection is posterior to this along the intergluteal cleft. No other fluid abnormality is seen. IMPRESSION: Subcutaneous air, inflammatory change and small fluid collection along the medial aspect of the left buttock adjacent to the intergluteal cleft. This is consistent with abscess formation. Electronically Signed   By: Oneil Devonshire M.D.   On: 07/04/2023 21:49    Microbiology: Results for orders placed or performed during the hospital encounter of 07/04/23  Culture, blood (Routine X 2) w Reflex to ID Panel     Status: None (Preliminary result)   Collection Time: 07/04/23 11:39 PM   Specimen: BLOOD RIGHT WRIST  Result Value Ref Range Status   Specimen Description   Final    BLOOD RIGHT WRIST Performed at [facility], [address] [place], [place] [address]    Special Requests   Final  BOTTLES DRAWN AEROBIC AND ANAEROBIC Blood Culture results may not be optimal due to an excessive volume of blood received in culture bottles Performed at [facility], [address], [place], [place] [address]    Culture   Final    NO GROWTH 3 DAYS Performed at [facility], [address], [place], [place] [address]    Report Status PENDING  Incomplete  Culture, blood (Routine X 2) w Reflex to ID Panel     Status: None (Preliminary result)   Collection Time: 07/04/23 11:44 PM   Specimen: BLOOD LEFT ARM  Result Value Ref Range Status   Specimen Description   Final    BLOOD LEFT ARM Performed at [facility], [address] [place], [place] [address]    Special Requests   Final    BOTTLES DRAWN AEROBIC AND ANAEROBIC Blood Culture adequate volume Performed at [facility], [address], [place], [place] [address]    Culture   Final    NO GROWTH 3 DAYS Performed at [facility], [address] [place], [place]  [address]    Report Status PENDING  Incomplete    Labs: CBC: Recent Labs  Lab 07/04/23 1706 07/05/23 1853 07/06/23 0422 07/07/23 0607 07/08/23 0319  WBC 21.4* 17.4* 13.5* 10.3 10.9*  NEUTROABS 18.7*  --   --   --   --   HGB 12.8 11.5* 11.1* 10.8* 10.0*  HCT 40.4 37.8 35.8* 35.2* 31.9*  MCV 75.8* 78.1* 78.5* 78.7* 78.6*  PLT 281 258 228 236 275   Basic Metabolic Panel: Recent Labs  Lab 07/04/23 1706 07/05/23 1853 07/06/23 0422 07/07/23 0607 07/08/23 0319  NA 137  --  133* 134* 140  K 3.9  --  3.3* 4.1 3.6  CL 101  --  101 103 106  CO2 24  --  24 25 25   GLUCOSE 104*  --  108* 95 94  BUN 12  --  13 13 15   CREATININE 0.89 0.95 0.94 0.98 0.99  CALCIUM  9.6  --  8.2* 8.3* 8.5*   Liver Function Tests: Recent Labs  Lab 07/04/23 1706  AST 56*  ALT 47*  ALKPHOS 122  BILITOT 1.5*  PROT 8.1  ALBUMIN 4.0   CBG: No results for input(s): GLUCAP in the last 168 hours.  Discharge time spent: less than 30 minutes.  Signed: Toribio Door, MD [facility] 07/08/2023

## 2023-07-10 LAB — CULTURE, BLOOD (ROUTINE X 2)
Culture: NO GROWTH
Culture: NO GROWTH
Special Requests: ADEQUATE

## 2023-07-12 ENCOUNTER — Telehealth: Payer: Self-pay

## 2023-07-12 NOTE — Transitions of Care (Post Inpatient/ED Visit) (Signed)
   07/12/2023  Name: Renee Hahn MRN: [id number] DOB: Apr 13, 1971  Today's TOC FU Call Status: Today's TOC FU Call Status:: Successful TOC FU Call Completed TOC FU Call Complete Date: 07/12/23 Patient's Name and Date of Birth confirmed.  Transition Care Management Follow-up Telephone Call Date of Discharge: 07/08/23 Discharge Facility: [name]) Type of Discharge: Inpatient Admission How have you been since you were released from the hospital?: Better Any questions or concerns?: No  Items Reviewed: Did you receive and understand the discharge instructions provided?: Yes Any new allergies since your discharge?: Yes Dietary orders reviewed?: NA Do you have support at home?: Yes  Medications Reviewed Today: Medications Reviewed Today   Medications were not reviewed in this encounter     Home Care and Equipment/Supplies: Were Home Health Services Ordered?: NA Any new equipment or medical supplies ordered?: NA  Functional Questionnaire: Do you need assistance with bathing/showering or dressing?: No Do you need assistance with meal preparation?: No Do you need assistance with eating?: No Do you have difficulty maintaining continence: No Do you need assistance with getting out of bed/getting out of a chair/moving?: No Do you have difficulty managing or taking your medications?: No  Follow up appointments reviewed: Specialist Hospital Follow-up appointment confirmed?: Yes Date of Specialist follow-up appointment?: 07/28/23 Follow-Up Specialty Provider:: gen surgery    SIGNATURE Dedra Sorenson, BSN, RN

## 2023-07-18 ENCOUNTER — Ambulatory Visit (INDEPENDENT_AMBULATORY_CARE_PROVIDER_SITE_OTHER): Payer: No Typology Code available for payment source | Admitting: Nurse Practitioner

## 2023-07-18 ENCOUNTER — Encounter: Payer: Self-pay | Admitting: Nurse Practitioner

## 2023-07-18 VITALS — BP 130/86 | HR 62 | Temp 97.3°F | Resp 18 | Wt 270.4 lb

## 2023-07-18 DIAGNOSIS — L03317 Cellulitis of buttock: Secondary | ICD-10-CM | POA: Diagnosis not present

## 2023-07-18 DIAGNOSIS — L0231 Cutaneous abscess of buttock: Secondary | ICD-10-CM | POA: Diagnosis not present

## 2023-07-18 LAB — CBC WITH DIFFERENTIAL/PLATELET
Basophils Absolute: 0 10*3/uL (ref 0.0–0.1)
Basophils Relative: 0.4 % (ref 0.0–3.0)
Eosinophils Absolute: 0.2 10*3/uL (ref 0.0–0.7)
Eosinophils Relative: 1.6 % (ref 0.0–5.0)
HCT: 42.5 % (ref 36.0–46.0)
Hemoglobin: 13.2 g/dL (ref 12.0–15.0)
Lymphocytes Relative: 26 % (ref 12.0–46.0)
Lymphs Abs: 2.5 10*3/uL (ref 0.7–4.0)
MCHC: 31 g/dL (ref 30.0–36.0)
MCV: 77 fL — ABNORMAL LOW (ref 78.0–100.0)
Monocytes Absolute: 0.5 10*3/uL (ref 0.1–1.0)
Monocytes Relative: 5.3 % (ref 3.0–12.0)
Neutro Abs: 6.4 10*3/uL (ref 1.4–7.7)
Neutrophils Relative %: 66.7 % (ref 43.0–77.0)
Platelets: 397 10*3/uL (ref 150.0–400.0)
RBC: 5.52 Mil/uL — ABNORMAL HIGH (ref 3.87–5.11)
RDW: 17.5 % — ABNORMAL HIGH (ref 11.5–15.5)
WBC: 9.6 10*3/uL (ref 4.0–10.5)

## 2023-07-18 LAB — HEPATIC FUNCTION PANEL
ALT: 50 U/L — ABNORMAL HIGH (ref 0–35)
AST: 47 U/L — ABNORMAL HIGH (ref 0–37)
Albumin: 4.1 g/dL (ref 3.5–5.2)
Alkaline Phosphatase: 101 U/L (ref 39–117)
Bilirubin, Direct: 0 mg/dL (ref 0.0–0.3)
Total Bilirubin: 0.4 mg/dL (ref 0.2–1.2)
Total Protein: 7.9 g/dL (ref 6.0–8.3)

## 2023-07-18 NOTE — Patient Instructions (Signed)
 Continue sitz bathe Maintain f/up with surgeon Go to lab

## 2023-07-18 NOTE — Assessment & Plan Note (Addendum)
 Hospitalization 07/05/23 to 07/08/23: I&D completed, reviewed lab results and radiology report. Blood culture negative, no wound culture collected. Zosyn , vancomycin , metronidazole , and rocephin  administered during hospitalization F/up appointment with surgeon 07/28/2023 Completed 5days of augmentin  875/125mg  oral  Today wound opening has small 3cm purulent drainage with 3cm anterior tunneling. no induration or erythema or fluctuance. She denies any fever, no constipation or diarrhea. Repeat CBC and hepatic panel Advised to continue sitz baths and maintain appointment with surgeon

## 2023-07-18 NOTE — Progress Notes (Signed)
 Established Patient Visit  Patient: Renee Hahn   DOB: 1971/03/21   53 y.o. Female  MRN: [id number] Visit Date: 07/18/2023  Subjective:    Chief Complaint  Patient presents with   Hospitalization Follow-up    PT C/O of left buttocks pain; symptoms has improved since discharge, she is unable to sit down properly due to discomfort.    HPI Cellulitis and abscess of buttock Hospitalization 07/05/23 to 07/08/23: I&D completed, reviewed lab results and radiology report. Blood culture negative, no wound culture collected. Zosyn , vancomycin , metronidazole , and rocephin  administered during hospitalization F/up appointment with surgeon 07/28/2023 Completed 5days of augmentin  875/125mg  oral  Today wound opening has small 3cm purulent drainage with 3cm anterior tunneling. no induration or erythema or fluctuance. She denies any fever, no constipation or diarrhea. Repeat CBC and hepatic panel Advised to continue sitz baths and maintain appointment with surgeon  Reviewed medical, surgical, and social history today  Medications: Outpatient Medications Prior to Visit  Medication Sig   acetaminophen  (TYLENOL ) 650 MG CR tablet Take 650-1,300 mg by mouth every 8 (eight) hours as needed for pain.   aspirin  EC 81 MG tablet Take 1 tablet (81 mg total) by mouth daily. Swallow whole.   atorvastatin  (LIPITOR) 80 MG tablet Take 1 tablet (80 mg total) by mouth daily.   ferrous gluconate  (FERGON) 324 MG tablet Take 324 mg by mouth 2 (two) times daily with a meal.   polyethylene glycol (MIRALAX ) 17 g packet Take 17 g by mouth daily as needed for mild constipation.   senna-docusate (SENOKOT-S) 8.6-50 MG tablet Take 1 tablet by mouth 2 (two) times daily.   Sodium Fluoride (FLUORIDEX DAILY DEFENSE DT)    trolamine salicylate (ASPERCREME) 10 % cream Apply 1 Application topically as needed for muscle pain. *as needed for legs*   No facility-administered medications prior to visit.    Reviewed past medical and social history.   ROS per HPI above      Objective:  BP 130/86 (BP Location: Left Arm, Patient Position: Sitting, Cuff Size: Large)   Pulse 62   Temp (!) 97.3 F (36.3 C) (Temporal)   Resp 18   Wt 270 lb 6.4 oz (122.7 kg)   LMP 11/17/2022 (Approximate)   SpO2 99%   BMI 54.61 kg/m      Physical Exam Vitals and nursing note reviewed.  Constitutional:      General: She is not in acute distress.    Appearance: She is not ill-appearing.  Cardiovascular:     Rate and Rhythm: Normal rate.     Pulses: Normal pulses.  Pulmonary:     Effort: Pulmonary effort is normal.  Abdominal:     General: There is no distension.     Palpations: Abdomen is soft.     Tenderness: There is no abdominal tenderness.  Skin:    Findings: Rash present. Rash is pustular.     No results found for any visits on 07/18/23.    Assessment & Plan:    Problem List Items Addressed This Visit     Cellulitis and abscess of buttock - Primary   Hospitalization 07/05/23 to 07/08/23: I&D completed, reviewed lab results and radiology report. Blood culture negative, no wound culture collected. Zosyn , vancomycin , metronidazole , and rocephin  administered during hospitalization F/up appointment with surgeon 07/28/2023 Completed 5days of augmentin  875/125mg  oral  Today wound opening has small 3cm purulent drainage with 3cm anterior tunneling. no  induration or erythema or fluctuance. She denies any fever, no constipation or diarrhea. Repeat CBC and hepatic panel Advised to continue sitz baths and maintain appointment with surgeon      Relevant Orders   Hepatic function panel   CBC with Differential/Platelet   Return in about 6 months (around 01/15/2024) for DM, hyperlipidemia (fasting).     Roselie Mood, NP

## 2023-08-10 ENCOUNTER — Encounter (HOSPITAL_COMMUNITY): Payer: Self-pay

## 2023-08-10 ENCOUNTER — Inpatient Hospital Stay (HOSPITAL_COMMUNITY)
Admission: EM | Admit: 2023-08-10 | Discharge: 2023-08-12 | DRG: 041 | Disposition: A | Discharge status: Home / Self Care | Payer: No Typology Code available for payment source | Attending: Internal Medicine | Admitting: Internal Medicine

## 2023-08-10 ENCOUNTER — Emergency Department (HOSPITAL_COMMUNITY): Payer: No Typology Code available for payment source

## 2023-08-10 ENCOUNTER — Inpatient Hospital Stay (HOSPITAL_COMMUNITY): Payer: No Typology Code available for payment source

## 2023-08-10 ENCOUNTER — Other Ambulatory Visit: Payer: Self-pay

## 2023-08-10 DIAGNOSIS — E441 Mild protein-calorie malnutrition: Secondary | ICD-10-CM | POA: Diagnosis present

## 2023-08-10 DIAGNOSIS — I63512 Cerebral infarction due to unspecified occlusion or stenosis of left middle cerebral artery: Secondary | ICD-10-CM | POA: Diagnosis present

## 2023-08-10 DIAGNOSIS — Z8673 Personal history of transient ischemic attack (TIA), and cerebral infarction without residual deficits: Secondary | ICD-10-CM | POA: Diagnosis not present

## 2023-08-10 DIAGNOSIS — Z803 Family history of malignant neoplasm of breast: Secondary | ICD-10-CM | POA: Diagnosis not present

## 2023-08-10 DIAGNOSIS — Z91041 Radiographic dye allergy status: Secondary | ICD-10-CM

## 2023-08-10 DIAGNOSIS — I639 Cerebral infarction, unspecified: Secondary | ICD-10-CM | POA: Diagnosis not present

## 2023-08-10 DIAGNOSIS — Z7902 Long term (current) use of antithrombotics/antiplatelets: Secondary | ICD-10-CM

## 2023-08-10 DIAGNOSIS — D509 Iron deficiency anemia, unspecified: Secondary | ICD-10-CM | POA: Diagnosis present

## 2023-08-10 DIAGNOSIS — Z7982 Long term (current) use of aspirin: Secondary | ICD-10-CM

## 2023-08-10 DIAGNOSIS — E782 Mixed hyperlipidemia: Secondary | ICD-10-CM | POA: Diagnosis present

## 2023-08-10 DIAGNOSIS — R29701 NIHSS score 1: Secondary | ICD-10-CM | POA: Diagnosis present

## 2023-08-10 DIAGNOSIS — R7303 Prediabetes: Secondary | ICD-10-CM | POA: Diagnosis present

## 2023-08-10 DIAGNOSIS — Z79899 Other long term (current) drug therapy: Secondary | ICD-10-CM

## 2023-08-10 DIAGNOSIS — E66813 Obesity, class 3: Secondary | ICD-10-CM | POA: Diagnosis present

## 2023-08-10 DIAGNOSIS — Z8249 Family history of ischemic heart disease and other diseases of the circulatory system: Secondary | ICD-10-CM | POA: Diagnosis not present

## 2023-08-10 DIAGNOSIS — I6389 Other cerebral infarction: Secondary | ICD-10-CM | POA: Diagnosis not present

## 2023-08-10 DIAGNOSIS — I10 Essential (primary) hypertension: Secondary | ICD-10-CM | POA: Diagnosis present

## 2023-08-10 DIAGNOSIS — M7989 Other specified soft tissue disorders: Secondary | ICD-10-CM | POA: Diagnosis not present

## 2023-08-10 DIAGNOSIS — R4701 Aphasia: Secondary | ICD-10-CM | POA: Diagnosis present

## 2023-08-10 DIAGNOSIS — Z6841 Body Mass Index (BMI) 40.0 and over, adult: Secondary | ICD-10-CM | POA: Diagnosis not present

## 2023-08-10 DIAGNOSIS — I63412 Cerebral infarction due to embolism of left middle cerebral artery: Secondary | ICD-10-CM | POA: Diagnosis not present

## 2023-08-10 DIAGNOSIS — F32A Depression, unspecified: Secondary | ICD-10-CM

## 2023-08-10 LAB — COMPREHENSIVE METABOLIC PANEL
ALT: 41 U/L (ref 0–44)
AST: 39 U/L (ref 15–41)
Albumin: 3.4 g/dL — ABNORMAL LOW (ref 3.5–5.0)
Alkaline Phosphatase: 93 U/L (ref 38–126)
Anion gap: 8 (ref 5–15)
BUN: 12 mg/dL (ref 6–20)
CO2: 27 mmol/L (ref 22–32)
Calcium: 8.9 mg/dL (ref 8.9–10.3)
Chloride: 106 mmol/L (ref 98–111)
Creatinine, Ser: 0.83 mg/dL (ref 0.44–1.00)
GFR, Estimated: >60 mL/min (ref >60)
Glucose, Bld: 113 mg/dL — ABNORMAL HIGH (ref 70–99)
Potassium: 3.8 mmol/L (ref 3.5–5.1)
Sodium: 141 mmol/L (ref 135–145)
Total Bilirubin: 0.5 mg/dL (ref 0.0–1.2)
Total Protein: 7.5 g/dL (ref 6.5–8.1)

## 2023-08-10 LAB — I-STAT CHEM 8, ED
BUN: 11 mg/dL (ref 6–20)
Calcium, Ion: 1.18 mmol/L (ref 1.15–1.40)
Chloride: 105 mmol/L (ref 98–111)
Creatinine, Ser: 1 mg/dL (ref 0.44–1.00)
Glucose, Bld: 104 mg/dL — ABNORMAL HIGH (ref 70–99)
HCT: 38 % (ref 36.0–46.0)
Hemoglobin: 12.9 g/dL (ref 12.0–15.0)
Potassium: 3.8 mmol/L (ref 3.5–5.1)
Sodium: 142 mmol/L (ref 135–145)
TCO2: 27 mmol/L (ref 22–32)

## 2023-08-10 LAB — DIFFERENTIAL
Abs Immature Granulocytes: 0.04 10*3/uL (ref 0.00–0.07)
Basophils Absolute: 0 10*3/uL (ref 0.0–0.1)
Basophils Relative: 0 %
Eosinophils Absolute: 0.1 10*3/uL (ref 0.0–0.5)
Eosinophils Relative: 1 %
Immature Granulocytes: 1 %
Lymphocytes Relative: 18 %
Lymphs Abs: 1.5 10*3/uL (ref 0.7–4.0)
Monocytes Absolute: 0.5 10*3/uL (ref 0.1–1.0)
Monocytes Relative: 6 %
Neutro Abs: 6.1 10*3/uL (ref 1.7–7.7)
Neutrophils Relative %: 74 %

## 2023-08-10 LAB — URINALYSIS, ROUTINE W REFLEX MICROSCOPIC
Bilirubin Urine: NEGATIVE
Glucose, UA: NEGATIVE mg/dL
Ketones, ur: NEGATIVE mg/dL
Nitrite: NEGATIVE
Protein, ur: NEGATIVE mg/dL
Specific Gravity, Urine: 1.039 — ABNORMAL HIGH (ref 1.005–1.030)
pH: 6 (ref 5.0–8.0)

## 2023-08-10 LAB — RAPID URINE DRUG SCREEN, HOSP PERFORMED
Amphetamines: NOT DETECTED
Barbiturates: NOT DETECTED
Benzodiazepines: NOT DETECTED
Cocaine: NOT DETECTED
Opiates: NOT DETECTED
Tetrahydrocannabinol: NOT DETECTED

## 2023-08-10 LAB — CBC
HCT: 40 % (ref 36.0–46.0)
Hemoglobin: 12.2 g/dL (ref 12.0–15.0)
MCH: 23.7 pg — ABNORMAL LOW (ref 26.0–34.0)
MCHC: 30.5 g/dL (ref 30.0–36.0)
MCV: 77.8 fL — ABNORMAL LOW (ref 80.0–100.0)
Platelets: 273 10*3/uL (ref 150–400)
RBC: 5.14 MIL/uL — ABNORMAL HIGH (ref 3.87–5.11)
RDW: 15.9 % — ABNORMAL HIGH (ref 11.5–15.5)
WBC: 8.1 10*3/uL (ref 4.0–10.5)
nRBC: 0 % (ref 0.0–0.2)

## 2023-08-10 LAB — ANTITHROMBIN III: AntiThromb III Func: 107 % (ref 75–120)

## 2023-08-10 LAB — PROTIME-INR
INR: 1 (ref 0.8–1.2)
Prothrombin Time: 13 s (ref 11.4–15.2)

## 2023-08-10 LAB — APTT: aPTT: 29 s (ref 24–36)

## 2023-08-10 LAB — ETHANOL: Alcohol, Ethyl (B): <10 mg/dL (ref <10)

## 2023-08-10 LAB — CBG MONITORING, ED: Glucose-Capillary: 88 mg/dL (ref 70–99)

## 2023-08-10 MED ORDER — STROKE: EARLY STAGES OF RECOVERY BOOK
Freq: Once | Status: AC
  Filled 2023-08-10: qty 1

## 2023-08-10 MED ORDER — ENOXAPARIN SODIUM 40 MG/0.4ML IJ SOSY
40.0000 mg | PREFILLED_SYRINGE | INTRAMUSCULAR | Status: DC
  Administered 2023-08-10 – 2023-08-11 (×2): 40 mg via SUBCUTANEOUS
  Filled 2023-08-10 (×2): qty 0.4

## 2023-08-10 MED ORDER — SENNOSIDES-DOCUSATE SODIUM 8.6-50 MG PO TABS: 1.0000 | ORAL_TABLET | Freq: Two times a day (BID) | ORAL | Status: DC | PRN

## 2023-08-10 MED ORDER — CLOPIDOGREL BISULFATE 300 MG PO TABS
300.0000 mg | ORAL_TABLET | Freq: Once | ORAL | Status: AC
  Administered 2023-08-10: 300 mg via ORAL
  Filled 2023-08-10: qty 1

## 2023-08-10 MED ORDER — ACETAMINOPHEN 325 MG PO TABS
650.0000 mg | ORAL_TABLET | Freq: Four times a day (QID) | ORAL | Status: DC | PRN
  Administered 2023-08-10 – 2023-08-11 (×2): 650 mg via ORAL
  Filled 2023-08-10 (×2): qty 2

## 2023-08-10 MED ORDER — ASPIRIN 81 MG PO TBEC
81.0000 mg | DELAYED_RELEASE_TABLET | Freq: Every day | ORAL | Status: DC
  Administered 2023-08-10 – 2023-08-12 (×3): 81 mg via ORAL
  Filled 2023-08-10 (×3): qty 1

## 2023-08-10 MED ORDER — ONDANSETRON HCL 4 MG/2ML IJ SOLN: 4.0000 mg | Freq: Four times a day (QID) | INTRAMUSCULAR | Status: DC | PRN

## 2023-08-10 MED ORDER — LORAZEPAM 2 MG/ML IJ SOLN
2.0000 mg | Freq: Once | INTRAMUSCULAR | Status: AC | PRN
  Administered 2023-08-10: 2 mg via INTRAVENOUS
  Filled 2023-08-10: qty 1

## 2023-08-10 MED ORDER — ACETAMINOPHEN 650 MG RE SUPP: 650.0000 mg | Freq: Four times a day (QID) | RECTAL | Status: DC | PRN

## 2023-08-10 MED ORDER — ATORVASTATIN CALCIUM 80 MG PO TABS
80.0000 mg | ORAL_TABLET | Freq: Every day | ORAL | Status: DC
  Administered 2023-08-10 – 2023-08-12 (×3): 80 mg via ORAL
  Filled 2023-08-10 (×2): qty 1
  Filled 2023-08-10: qty 2

## 2023-08-10 MED ORDER — DIPHENHYDRAMINE HCL 50 MG/ML IJ SOLN
INTRAMUSCULAR | Status: AC
  Administered 2023-08-10: 25 mg
  Filled 2023-08-10: qty 1

## 2023-08-10 MED ORDER — ONDANSETRON HCL 4 MG PO TABS: 4.0000 mg | ORAL_TABLET | Freq: Four times a day (QID) | ORAL | Status: DC | PRN

## 2023-08-10 MED ORDER — IOHEXOL 350 MG/ML SOLN
75.0000 mL | Freq: Once | INTRAVENOUS | Status: AC | PRN
  Administered 2023-08-10: 75 mL via INTRAVENOUS

## 2023-08-10 MED ORDER — POLYETHYLENE GLYCOL 3350 17 G PO PACK: 17.0000 g | PACK | Freq: Every day | ORAL | Status: DC | PRN

## 2023-08-10 NOTE — Plan of Care (Signed)

## 2023-08-10 NOTE — Progress Notes (Signed)
 Pt reports she was reading last night and was unable to understand what she was reading. She went to bed and symptoms remained upon waking. Hx stroke that she fully recovered from.  LNW 2230 2/4 mRS 0  Received call from Dr. Jerrie at [phone] stating there was a code stroke at [facility] cart 1. This TSRN joined cart and pt was already in CT at this time with Dr. Jerrie on camera.

## 2023-08-10 NOTE — H&P (Signed)
 History and Physical    Patient: Renee Hahn FMW:[id number] DOB: 09-10-1970 DOA: 08/10/2023 DOS: the patient was seen and examined on 08/10/2023 PCP: Katheen Roselie Rockford, NP  Patient coming from: Home  Chief Complaint:  Chief Complaint  Patient presents with   Aphasia   HPI: Renee Hahn is a 53 y.o. female with medical history significant of class III obesity, cholelithiasis, high fever, headache, hyperlipidemia, iron  deficiency anemia, menorrhagia, prediabetes, history of nonhemorrhagic CVA, vitamin B12 deficiency who presented to the emergency department with aphasia.  She stated that last night she had a frontal headache.  She was also trying to read unable to comprehend the words as they were jumble in my head, but has improved this morning.  She does not have a headache this a.m.  Her symptoms are similar to when she had a CVA 3 years ago.  She denied fever, chills, rhinorrhea, sore throat, wheezing or hemoptysis.  No chest pain, palpitations, diaphoresis, PND, orthopnea or recent pitting edema of the lower extremities.  No abdominal pain, nausea, emesis, diarrhea, constipation, melena or hematochezia.  No flank pain, dysuria, frequency or hematuria.  No polyuria, polydipsia or polyphagia.  Lab work: CBC showed a white count of 8.1, hemoglobin 12.2 g/dL and platelets 726.  Normal PT, INR and PTT.  Alcohol level was unremarkable.  CMP showed a glucose of 113 mg/dL and albumin of 3.4 g/dL, the rest of the CMP measurements were normal.  Imaging: CT head without contrast showed loss of gray-white differentiation within the left parietal lobe (within the posterior left MCA vascular territory) suspicious for acute infarct.  Chronic cortical/subcortical left MCA territory infarct within the left frontal operculum and insula.  CTA head and neck could not be adequately assess due to poor quality arterial contrast bolus.  MRI brain and MRA head show an acute infarct in the left MCA territory  involving the left parietal and temporal lobes.  No hemorrhage.  Chronic infarct in the left frontal opercular region.  No intracranial large vessel occlusion or significant stenosis.   ED course: Initial vital signs were temperature 98.3 F, pulse 92, respiration 18, BP 136/87 mmHg O2 sat 100% on room air.  The patient received lorazepam  2 mg IVP for MRI imaging.  Review of Systems: As mentioned in the history of present illness. All other systems reviewed and are negative. Past Medical History:  Diagnosis Date   BMI 50.0-59.9, adult (HCC) 01/28/2020   Cholelithiasis    Hay fever    Headache    past   Hyperlipidemia    Iron  deficiency anemia    Menorrhagia 12/18/2019   Prediabetes    Stroke (HCC)    Vitamin B12 deficiency 02/27/2021   Past Surgical History:  Procedure Laterality Date   BREAST BIOPSY     BREAST EXCISIONAL BIOPSY Left 12/03/2021   benign   BREAST LUMPECTOMY WITH RADIOACTIVE SEED LOCALIZATION Left 12/03/2021   Procedure: LEFT BREAST LUMPECTOMY WITH RADIOACTIVE SEED LOCALIZATION;  Surgeon: Vanderbilt Ned, MD;  Location: [facility] OR;  Service: General;  Laterality: Left;   CHOLECYSTECTOMY N/A 09/21/2019   Procedure: LAPAROSCOPIC CHOLECYSTECTOMY WITH INTRAOPERATIVE CHOLANGIOGRAM;  Surgeon: Vanderbilt Ned, MD;  Location: [facility] ORS;  Service: General;  Laterality: N/A;   DILITATION & CURRETTAGE/HYSTROSCOPY WITH HYDROTHERMAL ABLATION N/A 11/17/2022   Procedure: DILATATION & CURETTAGE/HYSTEROSCOPY WITH HYDROTHERMAL ABLATION;  Surgeon: Rosalva Sawyer, MD;  Location: [facility] OR;  Service: Gynecology;  Laterality: N/A;   IRRIGATION AND DEBRIDEMENT BUTTOCKS Left 07/05/2023   Procedure: IRRIGATION AND DEBRIDEMENT LEFT  BUTTOCKS;  Surgeon: Debby Hila, MD;  Location: [facility] ORS;  Service: General;  Laterality: Left;   ROBOTIC ASSISTED TOTAL HYSTERECTOMY WITH BILATERAL SALPINGO OOPHERECTOMY N/A 01/11/2023   Procedure: XI ROBOTIC ASSISTED TOTAL HYSTERECTOMY >250g WITH BILATERAL SALPINGO OOPHORECTOMY,  MINI LAPAROTOMY;  Surgeon: Eldonna Mays, MD;  Location: [facility] ORS;  Service: Gynecology;  Laterality: N/A;   SENTINEL NODE BIOPSY N/A 01/11/2023   Procedure: SENTINEL NODE BIOPSY;  Surgeon: Eldonna Mays, MD;  Location: [facility] ORS;  Service: Gynecology;  Laterality: N/A;   THROMBECTOMY  2022   Social History:  reports that she has never smoked. She has never used smokeless tobacco. She reports current alcohol use. She reports that she does not use drugs.  Allergies  Allergen Reactions   Iodinated Contrast Media Itching    Family History  Problem Relation Age of Onset   Breast cancer Mother    Prostate cancer Mother    Prostate cancer Father    Hypertension Maternal Grandmother    Hypertension Paternal Grandfather    Endometrial cancer Neg Hx    Ovarian cancer Neg Hx    Colon cancer Neg Hx     Prior to Admission medications   Medication Sig Start Date End Date Taking? Authorizing Provider  acetaminophen  (TYLENOL ) 650 MG CR tablet Take 650-1,300 mg by mouth every 8 (eight) hours as needed for pain.    [provider]  aspirin  EC 81 MG tablet Take 1 tablet (81 mg total) by mouth daily. Swallow whole. 12/02/22   Nche, Roselie Rockford, NP  atorvastatin  (LIPITOR) 80 MG tablet Take 1 tablet (80 mg total) by mouth daily. 06/16/23   Nche, Roselie Rockford, NP  ferrous gluconate  (FERGON) 324 MG tablet Take 324 mg by mouth 2 (two) times daily with a meal.    [provider]  polyethylene glycol (MIRALAX ) 17 g packet Take 17 g by mouth daily as needed for mild constipation. 07/08/23   Jadine Toribio SQUIBB, MD  senna-docusate (SENOKOT-S) 8.6-50 MG tablet Take 1 tablet by mouth 2 (two) times daily. 07/08/23   Jadine Toribio SQUIBB, MD  Sodium Fluoride (FLUORIDEX DAILY DEFENSE DT)  08/05/22   [provider]  trolamine salicylate (ASPERCREME) 10 % cream Apply 1 Application topically as needed for muscle pain. *as needed for legs*    [provider]    Physical Exam: Vitals:    08/10/23 0803 08/10/23 0809 08/10/23 0945  BP: 136/87  123/69  Pulse: 92  69  Resp: 18  10  Temp: 98.3 F (36.8 C)    TempSrc: Oral    SpO2: 100%  100%  Weight:  122.7 kg   Height:  4' 11 (1.499 m)    Physical Exam Vitals and nursing note reviewed.  Constitutional:      General: She is awake. She is not in acute distress.    Appearance: Normal appearance. She is morbidly obese. She is ill-appearing.  HENT:     Head: Normocephalic.     Nose: No rhinorrhea.     Mouth/Throat:     Mouth: Mucous membranes are moist.  Eyes:     General: No scleral icterus.    Pupils: Pupils are equal, round, and reactive to light.  Neck:     Vascular: No JVD.  Cardiovascular:     Heart sounds: S1 normal and S2 normal.  Pulmonary:     Effort: Pulmonary effort is normal.     Breath sounds: Normal breath sounds. No wheezing, rhonchi or rales.  Abdominal:  General: Bowel sounds are normal. There is no distension.     Palpations: Abdomen is soft.     Tenderness: There is no abdominal tenderness. There is no guarding.  Musculoskeletal:     Cervical back: Neck supple.     Right lower leg: No edema.     Left lower leg: No edema.  Skin:    General: Skin is warm and dry.  Neurological:     General: No focal deficit present.     Mental Status: She is alert and oriented to person, place, and time.  Psychiatric:        Mood and Affect: Mood normal.        Behavior: Behavior normal. Behavior is cooperative.     Data Reviewed:  Results are pending, will review when available.  Assessment and Plan: Principal Problem:   Aphasia Telemetry/inpatient.. Frequent neurochecks. Consult PT and OT. Check fasting lipids. Check hemoglobin A1c. Check echocardiogram. MRI brain results appreciated. Risk factors modifications. Stroke team input appreciated. -Awaiting transfer to our stroke center.  Active Problems:   Prediabetes Carbohydrate modified diet. CBG monitoring with RI SS. Check  hemoglobin A1c.    Mixed hyperlipidemia Continue atorvastatin  80 mg p.o. daily.    Class 3 obesity Current BMI 54.64 kg/m. Would benefit from lifestyle modifications. Follow-up with PCP and/or bariatric clinic.    Mild protein malnutrition (HCC) In the setting of class III obesity. May benefit from protein supplementation. Consider nutritional services evaluation. Follow-up albumin level.    Hypertension History of elevated BP numbers. Currently not on antihypertensives. Monitor blood pressure closely. Allow permissive hypertension for now.    Advance Care Planning:   Code Status: Full Code   Consults: Neurohospitalist team.  Family Communication:   Severity of Illness: The appropriate patient status for this patient is INPATIENT. Inpatient status is judged to be reasonable and necessary in order to provide the required intensity of service to ensure the patient's safety. The patient's presenting symptoms, physical exam findings, and initial radiographic and laboratory data in the context of their chronic comorbidities is felt to place them at high risk for further clinical deterioration. Furthermore, it is not anticipated that the patient will be medically stable for discharge from the hospital within 2 midnights of admission.   * I certify that at the point of admission it is my clinical judgment that the patient will require inpatient hospital care spanning beyond 2 midnights from the point of admission due to high intensity of service, high risk for further deterioration and high frequency of surveillance required.*  Author: Alm Dorn Castor, MD 08/10/2023 10:45 AM  For on call review [email or web address].   This document was prepared using Dragon voice recognition software and may contain some unintended transcription errors.

## 2023-08-10 NOTE — Progress Notes (Signed)
 BLE venous duplex and carotid duplex have been completed>   Results can be found under chart review under CV PROC. 08/10/2023 5:37 PM Ezzie Senat RVT, RDMS

## 2023-08-10 NOTE — Progress Notes (Signed)
 Patient just received her lunch   Renee Hahn RVT RCS

## 2023-08-10 NOTE — ED Notes (Signed)
 Pt request bathroom assist. Suggested pt utilizes bedpan for safety. Pt and family member insists utilizing BSC. Pt able to transfer from bed to [facility] w/ limited assist. Pt received assist from family member, w NT at standby for safety. No complaints. Call bell in reach

## 2023-08-10 NOTE — ED Notes (Signed)
 Pt assisted to and from [facility], pt now resting back in bed. Pt dinner tray given. Pt has no further needs at this time.

## 2023-08-10 NOTE — Progress Notes (Signed)
 SLP Cancellation Note  Patient Details Name: Renee Hahn MRN: [id number] DOB: 04-07-1971   Cancelled treatment:       Reason Eval/Treat Not Completed: (P) Other (comment) (RN reports pt to go to [facility] for full stroke work up, will continue efforts)   Renee Hahn 08/10/2023, 4:55 PM  Renee POUR, MS [facility] SLP Acute Rehab Services Office [phone]

## 2023-08-10 NOTE — Progress Notes (Signed)
 PT Cancellation Note  Patient Details Name: Renee Hahn MRN: [id number] DOB: 24-Oct-1970   Cancelled Treatment:    Reason Eval/Treat Not Completed: Other (comment) PT orders received, chart reviewed. Per chart, plan to admit pt to [facility] for full stroke work-up. Will hold PT evaluation at this time & plan for PT evaluation after pt has transferred.   Renee Hahn, PT, DPT 08/10/23, 3:07 PM  Renee Hahn 08/10/2023, 3:06 PM

## 2023-08-10 NOTE — Consult Note (Addendum)
 [facility] Telemedicine Consult   Requesting Provider: Glendia Hahn Consult Participants: Myself, patient, brother at bedside, atrium nurse, bedside nurse Location of the provider: [facility] Location of the patient: Renee Hahn ED   This consult was provided via telemedicine with 2-way video and audio communication. The patient/family was informed that care would be provided in this way and agreed to receive care in this manner.    Chief Complaint: Difficulty reading  HPI: Renee Hahn is a 53 y.o. with PMHx significant for embolic L MCA stroke 02/2021 (no residual, cryptogenic etiology, workup at the time detailed in italics below), BMI 54.64  She reports she was reading last night at 10:30 PM.  Around 11 PM she suddenly had trouble reading, that she was falling asleep/dreaming.  However when she woke this morning she realized she was still having trouble reading and also more trouble talking.  Symptoms have been gradually improving.  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  - Transthoracic Echocardiogram: Left ventricular ejection fraction was 55-60%. Wall motion was normal. - Transesophageal  Echocardiogram: Negative for left atrial appendage thrombus with no interatrial shunt noted - A 30 day cardiac monitor is recommended to rule out paroxysmal atrial fibrillation [per patient resulted negative] - Serum Risk Factor Labs: - Hemoglobin A1C: 5.5 with no history of diabetes - Lipid profile: 90   LKW: 10:30 PM, symptom onset ~11 PM Thrombolytic given?: No, out of the window IR Thrombectomy? No, NIH too mild  Modified Rankin Scale: 0-Completely asymptomatic and back to baseline post- stroke; working and driving with no impairments  Time of teleneurologist evaluation: 8:51 AM   Exam: Vitals:   08/10/23 0803  BP: 136/87  Pulse: 92  Resp: 18  Temp: 98.3 F (36.8 C)  SpO2: 100%    General: No acute distress Pulmonary: breathing comfortably Cardiac: regular rate and rhythm on monitor   NIH Stroke scale 1A: Level of Consciousness - 0 1B: Ask Month and Age - 0 1C: 'Blink Eyes' & 'Squeeze Hands' - 0 2: Test Horizontal Extraocular Movements - 0 3: Test Visual Fields - 0 4: Test Facial Palsy - 0 5A: Test Left Arm Motor Drift - 0 5B: Test Right Arm Motor Drift - 0 6A: Test Left Leg Motor Drift - 0 6B: Test Right Leg Motor Drift - 0 7: Test Limb Ataxia - 0 8: Test Sensation - 0 9: Test Language/Aphasia- 1 10: Test Dysarthria - 0 11: Test Extinction/Inattention - 0 NIHSS score: 1   Imaging Reviewed:   Head CT,  1. Loss of gray-white differentiation within the left parietal lobe (within the posterior left MCA vascular territory) suspicious for acute infarct. ASPECTS is 9. [Age indeterminate for me] 2. Chronic cortical/subcortical left MCA territory infarct within the left  frontal operculum and insula.   CTA, no clear LVO though very limited by contrast timing (radiology report pending)   Labs reviewed in epic and pertinent values follow:  Basic Metabolic Panel: Recent Labs  Lab 08/10/23 0858  NA 142  K 3.8  CL 105  GLUCOSE 104*  BUN 11  CREATININE 1.00     CBC: Recent Labs  Lab 08/10/23 0858  HGB 12.9  HCT 38.0    Coagulation Studies: No results for input(s): LABPROT, INR in the last 72 hours.     Assessment: Transient difficulty reading / speaking in a patient with history of cryptogenic L MCA stroke.   Differential -- new stroke vs. seizure  Recommendations:  - STAT Head CT to rule out ICH  - CTA head and neck to evaluate for new LVO due to aphasia; limited by poor contrast timing - MRI brain to assess for new stroke (positive on my review)  - MRA head   Additional recommendations given MRI positive for stroke on my review (full sequences / read pending)  - Admit to [facility] for full stroke workup, stroke team to follow in consultation  - HgbA1c, fasting lipid panel - Carotid duplex - TCD  - LE Duplex  - Hypercoag panel - Frequent neuro checks - Echocardiogram - Continue home aspirin  81 mg daily - Plavix  300 mg load with 75 mg daily, course to be determined pending vessel imaging - Continue home atorvastatin  80 mg nightly, add PSK 9 inhibitor if patient is adherent but not meeting LDL goal less than 70 - Risk factor modification - Telemetry monitoring - Blood pressure goal   - Permissive hypertension to 220/120 due to acute stroke - PT consult, OT consult, Speech consult, unless patient is back to baseline   Renee Jernigan MD-PhD [facility] [phone]   If 8pm-8am, please page neurology on call as listed in AMION.  CRITICAL CARE Performed by: Renee Hahn   Total critical care time: 50 minutes  Critical care time was exclusive of separately billable procedures and treating other patients.  Critical care was necessary to treat or prevent imminent or life-threatening deterioration -- emergent evaluation for consideration of thrombectomy/thrombolytic  Critical care was time spent personally by me on the following activities: development of treatment plan with patient and/or surrogate as  well as nursing, discussions with consultants, evaluation of patient's response to treatment, examination of patient, obtaining history from patient or surrogate, ordering and performing treatments and interventions, ordering and review of laboratory studies, ordering and review of radiographic studies, pulse oximetry and re-evaluation of patient's condition.

## 2023-08-10 NOTE — ED Provider Notes (Signed)
 [place] EMERGENCY DEPARTMENT AT [facility] Provider Note   CSN: [id number] Arrival date & time: 08/10/23  0757     History  Chief Complaint  Patient presents with   Aphasia    Renee Hahn is a 53 y.o. female.  HPI 53 year old female presents with concern for a stroke.  The patient noticed last night she was reading her screen at around 11 PM and was falling asleep.  While that was happening she was having some problems seeing the screen and noted that it seemed like she was having a hard time seeing mostly the middle of her screen.  Had a headache at that time. This morning she was trying to read and the words seem to be getting jumbled when she was trying to read them.  This is similar to when she had her stroke in 2022.  She was having some trouble speaking when talking to her brother and so she called 911 and EMS brought her here.  No current headache or recent illness symptoms.  She feels like her symptoms are improving.  No weakness or numbness in her extremities. Her brother at the bedside noted she seems improved but still not quite right.  Home Medications Prior to Admission medications   Medication Sig Start Date End Date Taking? Authorizing Provider  acetaminophen  (TYLENOL ) 650 MG CR tablet Take 650-1,300 mg by mouth every 8 (eight) hours as needed for pain.    [provider]  aspirin  EC 81 MG tablet Take 1 tablet (81 mg total) by mouth daily. Swallow whole. 12/02/22   Nche, Roselie Rockford, NP  atorvastatin  (LIPITOR) 80 MG tablet Take 1 tablet (80 mg total) by mouth daily. 06/16/23   Nche, Roselie Rockford, NP  ferrous gluconate  (FERGON) 324 MG tablet Take 324 mg by mouth 2 (two) times daily with a meal.    [provider]  polyethylene glycol (MIRALAX ) 17 g packet Take 17 g by mouth daily as needed for mild constipation. 07/08/23   Jadine Toribio SQUIBB, MD  senna-docusate (SENOKOT-S) 8.6-50 MG tablet Take 1 tablet by mouth 2 (two) times daily.  07/08/23   Jadine Toribio SQUIBB, MD  Sodium Fluoride (FLUORIDEX DAILY DEFENSE DT)  08/05/22   [provider]  trolamine salicylate (ASPERCREME) 10 % cream Apply 1 Application topically as needed for muscle pain. *as needed for legs*    [provider]      Allergies    Iodinated contrast media    Review of Systems   Review of Systems  Constitutional:  Negative for fever.  Eyes:  Negative for visual disturbance (last night, no longer present).  Genitourinary:  Negative for dysuria.  Neurological:  Positive for speech difficulty. Negative for weakness, numbness and headaches.    Physical Exam Updated Vital Signs BP 123/69   Pulse 69   Temp 98.3 F (36.8 C) (Oral)   Resp 10   Ht 4' 11 (1.499 m)   Wt 122.7 kg   LMP 11/17/2022 (Approximate)   SpO2 100%   BMI 54.64 kg/m  Physical Exam Vitals and nursing note reviewed.  Constitutional:      Appearance: She is well-developed. She is obese.  HENT:     Head: Normocephalic and atraumatic.  Eyes:     Extraocular Movements: Extraocular movements intact.     Pupils: Pupils are equal, round, and reactive to light.  Cardiovascular:     Rate and Rhythm: Normal rate and regular rhythm.     Heart sounds:  Normal heart sounds.  Pulmonary:     Effort: Pulmonary effort is normal.     Breath sounds: Normal breath sounds.  Abdominal:     Palpations: Abdomen is soft.     Tenderness: There is no abdominal tenderness.  Skin:    General: Skin is warm and dry.  Neurological:     Mental Status: She is alert.     Comments: CN 3-12 grossly intact. Clear speech. Was able to read a sentence provided without difficulty. 5/5 strength in all 4 extremities. Grossly normal sensation.      ED Results / Procedures / Treatments   Labs (all labs ordered are listed, but only abnormal results are displayed) Labs Reviewed  CBC - Abnormal; Notable for the following components:      Result Value   RBC 5.14 (*)    MCV 77.8 (*)    MCH 23.7  (*)    RDW 15.9 (*)    All other components within normal limits  COMPREHENSIVE METABOLIC PANEL - Abnormal; Notable for the following components:   Glucose, Bld 113 (*)    Albumin 3.4 (*)    All other components within normal limits  I-STAT CHEM 8, ED - Abnormal; Notable for the following components:   Glucose, Bld 104 (*)    All other components within normal limits  ETHANOL  PROTIME-INR  APTT  DIFFERENTIAL  RAPID URINE DRUG SCREEN, HOSP PERFORMED  URINALYSIS, ROUTINE W REFLEX MICROSCOPIC  ANTITHROMBIN III   PROTEIN C ACTIVITY  PROTEIN C, TOTAL  PROTEIN S ACTIVITY  PROTEIN S, TOTAL  LUPUS ANTICOAGULANT PANEL  BETA-2 -GLYCOPROTEIN I ABS, IGG/M/A  HOMOCYSTEINE  FACTOR 5 LEIDEN  PROTHROMBIN GENE MUTATION  CARDIOLIPIN ANTIBODIES, IGG, IGM, IGA  CBG MONITORING, ED    EKG None  Radiology CT ANGIO HEAD NECK W WO CM Result Date: 08/10/2023 CLINICAL DATA:  Provided history: Stroke/TIA, determine embolic source. EXAM: CT ANGIOGRAPHY HEAD AND NECK WITH AND WITHOUT CONTRAST TECHNIQUE: Multidetector CT imaging of the head and neck was performed using the standard protocol during bolus administration of intravenous contrast. Multiplanar CT image reconstructions and MIPs were obtained to evaluate the vascular anatomy. Carotid stenosis measurements (when applicable) are obtained utilizing NASCET criteria, using the distal internal carotid diameter as the denominator. RADIATION DOSE REDUCTION: This exam was performed according to the departmental dose-optimization program which includes automated exposure control, adjustment of the mA and/or kV according to patient size and/or use of iterative reconstruction technique. CONTRAST:  75mL OMNIPAQUE  IOHEXOL  350 MG/ML SOLN COMPARISON:  Non-contrast head CT performed earlier today 08/10/2023. CT angiogram head/neck 02/20/2021. FINDINGS: CTA NECK FINDINGS Limited evaluation due to a poor quality arterial contrast bolus and due to motion degradation. Within  these limitations, findings are as follows. Aortic arch: The visualized thoracic aorta is normal in caliber. Streak/beam hardening artifact arising from a dense left-sided contrast bolus partially obscures the left subclavian artery. No definite hemodynamically significant stenosis of the innominate or visible proximal subclavian arteries. Right carotid system: Cannot be assessed due to a poor quality arterial contrast bolus. Left carotid system: Cannot be assessed due to a poor quality arterial contrast bolus. Vertebral arteries: Cannot be assessed due to a poor quality arterial contrast bolus. Skeleton: Poor dentition. Elsewhere, no acute fracture or aggressive osseous lesion. Other neck: Unremarkable. Upper chest: No consolidation within the imaged lung apices. Review of the MIP images confirms the above findings CTA HEAD FINDINGS Anterior circulation: The anterior intracranial arterial circulation cannot be adequately assessed due to a poor quality  arterial contrast bolus. Posterior circulation: The posterior intracranial arterial circulation cannot be adequately assessed due to a poor quality arterial contrast bolus. Venous sinuses: Within the limitations of contrast timing, no convincing thrombus. Anatomic variants: Cannot be assessed as described above. Review of the MIP images confirms the above findings These results were called by telephone at the time of interpretation on 08/10/2023 at 9:40 am to provider [facility] , who verbally acknowledged these results. IMPRESSION: CTA neck: The common carotid, internal carotid and vertebral arteries cannot be adequately assessed due to a a poor quality arterial contrast bolus. CTA head: The intracranial anterior and posterior arterial circulation cannot be adequately assessed due to a poor quality arterial contrast bolus. Electronically Signed   By: Rockey Childs D.O.   On: 08/10/2023 10:24   CT HEAD CODE STROKE WO CONTRAST Result Date: 08/10/2023 CLINICAL DATA:   Code stroke. Provided history: Neuro deficit, acute, stroke suspected. EXAM: CT HEAD WITHOUT CONTRAST TECHNIQUE: Contiguous axial images were obtained from the base of the skull through the vertex without intravenous contrast. RADIATION DOSE REDUCTION: This exam was performed according to the departmental dose-optimization program which includes automated exposure control, adjustment of the mA and/or kV according to patient size and/or use of iterative reconstruction technique. COMPARISON:  Head CT 02/20/2021. FINDINGS: Brain: No age-advanced or lobar predominant parenchymal atrophy. Loss of gray-white differentiation within the left parietal lobe within the posterior left MCA vascular territory suspicious for an acute infarct (for instance as seen on series 2, image 17 Chronic cortical/subcortical infarct within the left frontal operculum and left insula (MCA vascular territory).). There is no acute intracranial hemorrhage. No extra-axial fluid collection. No evidence of an intracranial mass. No midline shift. Vascular: No hyperdense vessel.  Atherosclerotic calcifications. Skull: No calvarial fracture or aggressive osseous lesion. Sinuses/Orbits: No mass or acute finding within the imaged orbits. No significant paranasal sinus disease at the imaged levels. ASPECTS [facility] Stroke Program Early CT Score) - Ganglionic level infarction (caudate, lentiform nuclei, internal capsule, insula, M1-M3 cortex): 7 - Supraganglionic infarction (M4-M6 cortex): 2 Total score (0-10 with 10 being normal): 9 Attempts are being made to reach the ordering provider at this time regarding impression #1. IMPRESSION: 1. Loss of gray-white differentiation within the left parietal lobe (within the posterior left MCA vascular territory) suspicious for acute infarct. ASPECTS is 9. 2. Chronic cortical/subcortical left MCA territory infarct within the left frontal operculum and insula. Electronically Signed   By: Rockey Childs D.O.   On:  08/10/2023 09:41    Procedures Procedures    Medications Ordered in ED Medications   stroke: early stages of recovery book (has no administration in time range)  clopidogrel  (PLAVIX ) tablet 300 mg (has no administration in time range)  diphenhydrAMINE  (BENADRYL ) 50 MG/ML injection (25 mg  Given 08/10/23 0935)  iohexol  (OMNIPAQUE ) 350 MG/ML injection 75 mL (75 mLs Intravenous Contrast Given 08/10/23 0929)  LORazepam  (ATIVAN ) injection 2 mg (2 mg Intravenous Given 08/10/23 0948)    ED Course/ Medical Decision Making/ A&P Clinical Course as of 08/10/23 1057  Wed Aug 10, 2023  0851 I briefly discussed with Dr. Jerrie, who recommends we call a code stroke given her symptoms and prior LVO with similar symptoms. [SG]    Clinical Course User Index [SG] Freddi Hamilton, MD                                 Medical Decision Making Amount  and/or Complexity of Data Reviewed Labs: ordered.    Details: Minimal hyperglycemia.  Labs otherwise unremarkable. Radiology: ordered and independent interpretation performed.    Details: Acute ischemic stroke on MRI.  No head bleed on CT  Risk Prescription drug management. Decision regarding hospitalization.   Patient presents with recurrent stroke symptoms.  MRI ultimately shows recurrent stroke.  Was called a code stroke as above after discussion with neurology but no TNK given given she is well outside the window and there does not appear to be an obvious large vessel occlusion.  She will need admission to [facility].  Discussed with Dr. Celinda for admission.  Neurology is recommending a 300 mg Plavix  load which I have ordered, patient reports no history of bleeding such as hematochezia or hematuria.        Final Clinical Impression(s) / ED Diagnoses Final diagnoses:  Acute ischemic stroke [facility])    Rx / DC Orders ED Discharge Orders     None         Freddi Hamilton, MD 08/10/23 1058

## 2023-08-10 NOTE — ED Notes (Signed)
Call to give report, nurse was not available, left message asking for return call

## 2023-08-10 NOTE — ED Notes (Signed)
 Lunch tray provided to pt.

## 2023-08-10 NOTE — ED Triage Notes (Signed)
 Patient brought in by EMS due to aphasia. Reports that last night around 11pm she was reading and was unable to comprehend the words as they were jumbled in my head. Reports she thought she was sleeping when this happened, but reports this morning it happened again. States the words were jumbled on the page and she was unable to comprehend them. Patient reports having a stroke 3 years ago and this was her primary complaint.

## 2023-08-11 ENCOUNTER — Inpatient Hospital Stay (HOSPITAL_COMMUNITY): Payer: No Typology Code available for payment source

## 2023-08-11 ENCOUNTER — Other Ambulatory Visit (HOSPITAL_COMMUNITY): Payer: No Typology Code available for payment source

## 2023-08-11 DIAGNOSIS — I10 Essential (primary) hypertension: Secondary | ICD-10-CM | POA: Diagnosis not present

## 2023-08-11 DIAGNOSIS — I639 Cerebral infarction, unspecified: Secondary | ICD-10-CM | POA: Diagnosis not present

## 2023-08-11 DIAGNOSIS — R4701 Aphasia: Secondary | ICD-10-CM | POA: Diagnosis not present

## 2023-08-11 DIAGNOSIS — E66813 Obesity, class 3: Secondary | ICD-10-CM | POA: Diagnosis not present

## 2023-08-11 DIAGNOSIS — I6389 Other cerebral infarction: Secondary | ICD-10-CM

## 2023-08-11 DIAGNOSIS — E782 Mixed hyperlipidemia: Secondary | ICD-10-CM

## 2023-08-11 HISTORY — DX: Cerebral infarction, unspecified: I63.9

## 2023-08-11 LAB — COMPREHENSIVE METABOLIC PANEL
ALT: 24 U/L (ref 0–44)
AST: 32 U/L (ref 15–41)
Albumin: 2.7 g/dL — ABNORMAL LOW (ref 3.5–5.0)
Alkaline Phosphatase: 83 U/L (ref 38–126)
Anion gap: 10 (ref 5–15)
BUN: 9 mg/dL (ref 6–20)
CO2: 25 mmol/L (ref 22–32)
Calcium: 9 mg/dL (ref 8.9–10.3)
Chloride: 106 mmol/L (ref 98–111)
Creatinine, Ser: 0.97 mg/dL (ref 0.44–1.00)
GFR, Estimated: >60 mL/min (ref >60)
Glucose, Bld: 101 mg/dL — ABNORMAL HIGH (ref 70–99)
Potassium: 3.7 mmol/L (ref 3.5–5.1)
Sodium: 141 mmol/L (ref 135–145)
Total Bilirubin: 0.9 mg/dL (ref 0.0–1.2)
Total Protein: 6.1 g/dL — ABNORMAL LOW (ref 6.5–8.1)

## 2023-08-11 LAB — HEMOGLOBIN A1C
Hgb A1c MFr Bld: 5.5 % (ref 4.8–5.6)
Mean Plasma Glucose: 111.15 mg/dL

## 2023-08-11 LAB — LIPID PANEL
Cholesterol: 131 mg/dL (ref 0–200)
HDL: 25 mg/dL — ABNORMAL LOW (ref >40)
LDL Cholesterol: 87 mg/dL (ref 0–99)
Total CHOL/HDL Ratio: 5.2 {ratio}
Triglycerides: 96 mg/dL (ref <150)
VLDL: 19 mg/dL (ref 0–40)

## 2023-08-11 LAB — CBC
HCT: 36.6 % (ref 36.0–46.0)
Hemoglobin: 11.4 g/dL — ABNORMAL LOW (ref 12.0–15.0)
MCH: 23.6 pg — ABNORMAL LOW (ref 26.0–34.0)
MCHC: 31.1 g/dL (ref 30.0–36.0)
MCV: 75.8 fL — ABNORMAL LOW (ref 80.0–100.0)
Platelets: 251 10*3/uL (ref 150–400)
RBC: 4.83 MIL/uL (ref 3.87–5.11)
RDW: 16 % — ABNORMAL HIGH (ref 11.5–15.5)
WBC: 7.5 10*3/uL (ref 4.0–10.5)
nRBC: 0 % (ref 0.0–0.2)

## 2023-08-11 LAB — ECHOCARDIOGRAM COMPLETE
AR max vel: 1.75 cm2
AV Area VTI: 1.74 cm2
AV Area mean vel: 1.74 cm2
AV Mean grad: 4 mm[Hg]
AV Peak grad: 8 mm[Hg]
Ao pk vel: 1.41 m/s
Area-P 1/2: 3.51 cm2
Height: 59 in
S' Lateral: 2.9 cm
Weight: 4328.07 [oz_av]

## 2023-08-11 LAB — GLUCOSE, CAPILLARY: Glucose-Capillary: 105 mg/dL — ABNORMAL HIGH (ref 70–99)

## 2023-08-11 MED ORDER — EZETIMIBE 10 MG PO TABS
10.0000 mg | ORAL_TABLET | Freq: Every day | ORAL | Status: DC
  Administered 2023-08-11 – 2023-08-12 (×2): 10 mg via ORAL
  Filled 2023-08-11 (×2): qty 1

## 2023-08-11 MED ORDER — CLOPIDOGREL BISULFATE 75 MG PO TABS
75.0000 mg | ORAL_TABLET | Freq: Every day | ORAL | Status: DC
Start: 2023-08-11 — End: 2023-08-12
  Administered 2023-08-11 – 2023-08-12 (×2): 75 mg via ORAL
  Filled 2023-08-11 (×2): qty 1

## 2023-08-11 NOTE — Progress Notes (Signed)
  Echocardiogram 2D Echocardiogram has been performed.  Renee Hahn 08/11/2023, 9:17 AM

## 2023-08-11 NOTE — Plan of Care (Signed)
 Problem: Education: Goal: Knowledge of disease or condition will improve 08/11/2023 0441 by Jori Roderic CROME, RN Outcome: Progressing 08/10/2023 2214 by Jori Roderic CROME, RN Outcome: Progressing Goal: Knowledge of secondary prevention will improve (MUST DOCUMENT ALL) 08/11/2023 0441 by Jori Roderic CROME, RN Outcome: Progressing 08/10/2023 2214 by Jori Roderic CROME, RN Outcome: Progressing Goal: Knowledge of patient specific risk factors will improve (DELETE if not current risk factor) 08/11/2023 0441 by Jori Roderic CROME, RN Outcome: Progressing 08/10/2023 2214 by Jori Roderic CROME, RN Outcome: Progressing   Problem: Ischemic Stroke/TIA Tissue Perfusion: Goal: Complications of ischemic stroke/TIA will be minimized 08/11/2023 0441 by Jori Roderic CROME, RN Outcome: Progressing 08/10/2023 2214 by Jori Roderic CROME, RN Outcome: Progressing   Problem: Coping: Goal: Will verbalize positive feelings about self 08/11/2023 0441 by Jori Roderic CROME, RN Outcome: Progressing 08/10/2023 2214 by Jori Roderic CROME, RN Outcome: Progressing Goal: Will identify appropriate support needs 08/11/2023 0441 by Jori Roderic CROME, RN Outcome: Progressing 08/10/2023 2214 by Jori Roderic CROME, RN Outcome: Progressing   Problem: Health Behavior/Discharge Planning: Goal: Ability to manage health-related needs will improve 08/11/2023 0441 by Jori Roderic CROME, RN Outcome: Progressing 08/10/2023 2214 by Jori Roderic CROME, RN Outcome: Progressing Goal: Goals will be collaboratively established with patient/family 08/11/2023 0441 by Jori Roderic CROME, RN Outcome: Progressing 08/10/2023 2214 by Jori Roderic CROME, RN Outcome: Progressing   Problem: Self-Care: Goal: Ability to participate in self-care as condition permits will improve 08/11/2023 0441 by Jori Roderic CROME, RN Outcome: Progressing 08/10/2023 2214 by Jori Roderic CROME, RN Outcome: Progressing Goal: Verbalization  of feelings and concerns over difficulty with self-care will improve 08/11/2023 0441 by Jori Roderic CROME, RN Outcome: Progressing 08/10/2023 2214 by Jori Roderic CROME, RN Outcome: Progressing Goal: Ability to communicate needs accurately will improve 08/11/2023 0441 by Jori Roderic CROME, RN Outcome: Progressing 08/10/2023 2214 by Jori Roderic CROME, RN Outcome: Progressing   Problem: Nutrition: Goal: Risk of aspiration will decrease 08/11/2023 0441 by Jori Roderic CROME, RN Outcome: Progressing 08/10/2023 2214 by Jori Roderic CROME, RN Outcome: Progressing Goal: Dietary intake will improve 08/11/2023 0441 by Jori Roderic CROME, RN Outcome: Progressing 08/10/2023 2214 by Jori Roderic CROME, RN Outcome: Progressing   Problem: Education: Goal: Knowledge of General Education information will improve Description: Including pain rating scale, medication(s)/side effects and non-pharmacologic comfort measures 08/11/2023 0441 by Jori Roderic CROME, RN Outcome: Progressing 08/10/2023 2214 by Jori Roderic CROME, RN Outcome: Progressing   Problem: Health Behavior/Discharge Planning: Goal: Ability to manage health-related needs will improve 08/11/2023 0441 by Jori Roderic CROME, RN Outcome: Progressing 08/10/2023 2214 by Jori Roderic CROME, RN Outcome: Progressing   Problem: Clinical Measurements: Goal: Ability to maintain clinical measurements within normal limits will improve 08/11/2023 0441 by Jori Roderic CROME, RN Outcome: Progressing 08/10/2023 2214 by Jori Roderic CROME, RN Outcome: Progressing Goal: Will remain free from infection 08/11/2023 0441 by Jori Roderic CROME, RN Outcome: Progressing 08/10/2023 2214 by Jori Roderic CROME, RN Outcome: Progressing Goal: Diagnostic test results will improve 08/11/2023 0441 by Jori Roderic CROME, RN Outcome: Progressing 08/10/2023 2214 by Jori Roderic CROME, RN Outcome: Progressing Goal: Respiratory complications will  improve 08/11/2023 0441 by Jori Roderic CROME, RN Outcome: Progressing 08/10/2023 2214 by Jori Roderic CROME, RN Outcome: Progressing Goal: Cardiovascular complication will be avoided 08/11/2023 0441 by Jori Roderic CROME, RN Outcome: Progressing 08/10/2023 2214 by Jori Roderic CROME, RN Outcome: Progressing   Problem: Activity: Goal: Risk for activity intolerance will decrease 08/11/2023 0441 by Jori Roderic CROME, RN Outcome: Progressing 08/10/2023 2214 by  Jori Roderic CROME, RN Outcome: Progressing   Problem: Nutrition: Goal: Adequate nutrition will be maintained 08/11/2023 0441 by Jori Roderic CROME, RN Outcome: Progressing 08/10/2023 2214 by Jori Roderic CROME, RN Outcome: Progressing   Problem: Coping: Goal: Level of anxiety will decrease 08/11/2023 0441 by Jori Roderic CROME, RN Outcome: Progressing 08/10/2023 2214 by Jori Roderic CROME, RN Outcome: Progressing   Problem: Elimination: Goal: Will not experience complications related to bowel motility 08/11/2023 0441 by Jori Roderic CROME, RN Outcome: Progressing 08/10/2023 2214 by Jori Roderic CROME, RN Outcome: Progressing Goal: Will not experience complications related to urinary retention 08/11/2023 0441 by Jori Roderic CROME, RN Outcome: Progressing 08/10/2023 2214 by Jori Roderic CROME, RN Outcome: Progressing   Problem: Pain Managment: Goal: General experience of comfort will improve and/or be controlled 08/11/2023 0441 by Jori Roderic CROME, RN Outcome: Progressing 08/10/2023 2214 by Jori Roderic CROME, RN Outcome: Progressing   Problem: Safety: Goal: Ability to remain free from injury will improve 08/11/2023 0441 by Jori Roderic CROME, RN Outcome: Progressing 08/10/2023 2214 by Jori Roderic CROME, RN Outcome: Progressing   Problem: Skin Integrity: Goal: Risk for impaired skin integrity will decrease 08/11/2023 0441 by Jori Roderic CROME, RN Outcome: Progressing 08/10/2023 2214 by Jori Roderic CROME, RN Outcome: Progressing

## 2023-08-11 NOTE — Progress Notes (Signed)
 TCD with bubbles has been completed. Preliminary results can be found in CV Proc through chart review.   08/11/23 2:41 PM Birda Buffy RVT

## 2023-08-11 NOTE — Consult Note (Addendum)
 ELECTROPHYSIOLOGY CONSULT NOTE  Patient ID: Renee Hahn MRN: [id number], DOB/AGE: 1971-04-09   Admit date: 08/10/2023 Date of Consult: 08/12/23  Primary Physician: Katheen Roselie Rockford, NP Primary Cardiologist: None  Primary Electrophysiologist: none prior Reason for Consultation: Cryptogenic stroke; recommendations regarding Implantable Loop Recorder, requested by Dr. Jerri Insurance: [employer]ULAN  History of Present Illness EP has been asked to evaluate Renee Hahn for placement of an implantable loop recorder to monitor for atrial fibrillation by Dr Jerri.  The patient was admitted on 08/10/2023 with sudden onset difficulty reading.   Stroke:  left MCA territory infarct, embolic pattern, etiology: Cryptogenic    Pt has had priir stroke, wore a 30 day monitor back in 2020 via [facility] > unable to see report, pt reports she was told she had no abnormal findings  Imaging and work up demonstrated:   Code Stroke CT head loss of gray-white differentiation within left parietal lobe suspicious for acute infarct ASPECTS 9 CTA head & neck nondiagnostic MRI acute infarct in left MCA territory involving left parietal and temporal lobes, chronic infarct in left frontal opercular region MRA no LVO or hemodynamically significant stenosis Carotid Doppler bilateral carotid arteries near normal with minimal thickening or plaque 2D Echo EF 50 to 55%, severely dilated right atrium, normal left atrium, no atrial level shunt TCD bubble study No HITS detected at rest or with Valsalva. Negative for right to left  shunt.  LE venous doppler: BILATERAL: - No evidence of deep vein thrombosis seen in the lower extremities, bilaterally. -No evidence of popliteal cyst, bilaterally.  Consider loop recorder prior to discharge LDL 87 HgbA1c 5.5 VTE prophylaxis -Lovenox  aspirin  81 mg daily prior to admission, now on aspirin  81 mg daily and clopidogrel  75 mg daily for 3 weeks and then Plavix  alone. Therapy  recommendations:  None recommended by PT Disposition: Home    The patient has been monitored on telemetry which has demonstrated sinus rhythm with no arrhythmias.  Inpatient stroke work-up will not require a TEE per Neurology given normal TEE in Aug 2022  Echocardiogram as above. Lab work is reviewed.  Prior to admission, the patient denies chest pain, shortness of breath, dizziness, palpitations, or syncope.  She is recovering from her stroke with plans to return home  at discharge.  Allergies, Past Medical, Surgical, Social, and Family Histories have been reviewed and are referenced here-in when relevant for medical decision making.   Inpatient Medications:   aspirin  EC  81 mg Oral Daily   atorvastatin   80 mg Oral Daily   clopidogrel   75 mg Oral Daily   enoxaparin  (LOVENOX ) injection  40 mg Subcutaneous Q24H   ezetimibe   10 mg Oral Daily    Physical Exam: Vitals:   08/10/23 2324 08/11/23 0445 08/11/23 0828 08/11/23 1124  BP: 122/80 108/87 (!) 133/90 (!) 145/84  Pulse: 75  65 88  Resp: 16 18 18 18   Temp: 98.1 F (36.7 C) 98.1 F (36.7 C) 98.1 F (36.7 C) 98.5 F (36.9 C)  TempSrc: Oral Oral Oral Oral  SpO2: 99% 100% 100% 100%  Weight:      Height:        GEN- NAD. A&O x 3. Normal affect. HEENT: Normocephalic, atraumatic Lungs- CTAB, Normal effort.  Heart- Regular rate and rhythm rate and rhythm. No M/G/R.  Extremities- No peripheral edema. no clubbing or cyanosis Skin- warm and dry, no rash or lesion. Neuro : Ability to read has improved   12-lead ECG SR (personally reviewed) All  prior EKG's in EPIC reviewed with no documented atrial fibrillation  Telemetry SR, ST (personally reviewed)  Monitor at [facility] 03/2021 No SVT, no A. fib, no VT, no long pauses 3 seconds or more noted.  -Full note in care everywhere.  Assessment and Plan:  1. Cryptogenic stroke The patient presents with cryptogenic stroke.  I spoke at length with the patient about monitoring for afib  with an implantable loop recorder.  Risks, benefits, and alteratives to implantable loop recorder were discussed with the patient today.   At this time, the patient is very clear in their decision to proceed with implantable loop recorder.     Wound care was reviewed with the patient (keep incision clean and dry for 3 days). Please call with questions.   Renee Hahn, Renee Hahn  08/11/2023 2:21 PM   Cryptogenic Stroke  Hirsuitism   HTN   Obesity    Agree with LOOP given the limitations of what we know about acting on the information    She is also part of the [employer] F X1 trial for stroke patients.  IF atrial fib were identified, then based on the previous trial she would be transitioned to Xa inhibitor  Reviewed with patient

## 2023-08-11 NOTE — Progress Notes (Signed)
 Patient stated that she is agreeable to loop recorder procedure.

## 2023-08-11 NOTE — Progress Notes (Addendum)
 STROKE TEAM PROGRESS NOTE   INTERIM HISTORY/SUBJECTIVE Patient reports that her ability to understand written language has returned to baseline.  She is able to read a paragraph easily.  She has remained hemodynamically stable, and her neurological exam is otherwise unchanged.  Embolic stroke in 02/2021.  Discussed with patient regarding stroke clinical trial.  OBJECTIVE  CBC    Component Value Date/Time   WBC 7.5 08/11/2023 0606   RBC 4.83 08/11/2023 0606   HGB 11.4 (L) 08/11/2023 0606   HGB 12.1 04/30/2020 1010   HCT 36.6 08/11/2023 0606   PLT 251 08/11/2023 0606   PLT 255 04/30/2020 1010   MCV 75.8 (L) 08/11/2023 0606   MCH 23.6 (L) 08/11/2023 0606   MCHC 31.1 08/11/2023 0606   RDW 16.0 (H) 08/11/2023 0606   LYMPHSABS 1.5 08/10/2023 0854   MONOABS 0.5 08/10/2023 0854   EOSABS 0.1 08/10/2023 0854   BASOSABS 0.0 08/10/2023 0854    BMET    Component Value Date/Time   NA 141 08/11/2023 0606   K 3.7 08/11/2023 0606   CL 106 08/11/2023 0606   CO2 25 08/11/2023 0606   GLUCOSE 101 (H) 08/11/2023 0606   BUN 9 08/11/2023 0606   CREATININE 0.97 08/11/2023 0606   CREATININE 0.82 12/18/2019 1431   CALCIUM  9.0 08/11/2023 0606   GFRNONAA >60 08/11/2023 0606   GFRNONAA >60 12/18/2019 1431    IMAGING past 24 hours ECHOCARDIOGRAM COMPLETE Result Date: 08/11/2023    ECHOCARDIOGRAM REPORT   Patient Name:   Renee Hahn Date of Exam: 08/11/2023 Medical Rec #:  [id number]          Height:       59.0 in Accession #:    [id number]         Weight:       270.5 lb Date of Birth:  Dec 26, 1970          BSA:          2.097 m Patient Age:    52 years           BP:           108/87 mmHg Patient Gender: F                  HR:           70 bpm. Exam Location:  Inpatient Procedure: 2D Echo, 3D Echo, Cardiac Doppler, Color Doppler, Strain Analysis and            Saline Contrast Bubble Study Indications:    Stroke  History:        Patient has no prior history of Echocardiogram examinations.                  Risk Factors:Hypertension and Dyslipidemia.  Sonographer:    Ozell Free Referring Phys: [id number] SRISHTI L BHAGAT IMPRESSIONS  1. Left ventricular ejection fraction, by estimation, is 50 to 55%. The left ventricle has low normal function. The left ventricle has no regional wall motion abnormalities. There is mild concentric left ventricular hypertrophy. Left ventricular diastolic parameters are indeterminate. The average left ventricular global longitudinal strain is -19.0 %. The global longitudinal strain is normal.  2. Right ventricular systolic function is normal. The right ventricular size is mildly enlarged. There is normal pulmonary artery systolic pressure.  3. Right atrial size was severely dilated.  4. The mitral valve is normal in structure. No evidence of mitral valve regurgitation. No evidence of mitral stenosis.  5. The  aortic valve is normal in structure. Aortic valve regurgitation is not visualized. Aortic valve sclerosis/calcification is present, without any evidence of aortic stenosis.  6. The inferior vena cava is normal in size with greater than 50% respiratory variability, suggesting right atrial pressure of 3 mmHg. FINDINGS  Left Ventricle: Left ventricular ejection fraction, by estimation, is 50 to 55%. The left ventricle has low normal function. The left ventricle has no regional wall motion abnormalities. The average left ventricular global longitudinal strain is -19.0 %. The global longitudinal strain is normal. The left ventricular internal cavity size was normal in size. There is mild concentric left ventricular hypertrophy. Left ventricular diastolic parameters are indeterminate. Right Ventricle: The right ventricular size is mildly enlarged. No increase in right ventricular wall thickness. Right ventricular systolic function is normal. There is normal pulmonary artery systolic pressure. The tricuspid regurgitant velocity is 2.35  m/s, and with an assumed right atrial pressure of 8 mmHg,  the estimated right ventricular systolic pressure is 30.1 mmHg. Left Atrium: Left atrial size was normal in size. Right Atrium: Right atrial size was severely dilated. Pericardium: There is no evidence of pericardial effusion. Mitral Valve: The mitral valve is normal in structure. No evidence of mitral valve regurgitation. No evidence of mitral valve stenosis. Tricuspid Valve: The tricuspid valve is normal in structure. Tricuspid valve regurgitation is not demonstrated. No evidence of tricuspid stenosis. Aortic Valve: The aortic valve is normal in structure. Aortic valve regurgitation is not visualized. Aortic valve sclerosis/calcification is present, without any evidence of aortic stenosis. Aortic valve mean gradient measures 4.0 mmHg. Aortic valve peak  gradient measures 8.0 mmHg. Aortic valve area, by VTI measures 1.74 cm. Pulmonic Valve: The pulmonic valve was normal in structure. Pulmonic valve regurgitation is not visualized. No evidence of pulmonic stenosis. Aorta: The aortic root is normal in size and structure. Venous: The inferior vena cava is normal in size with greater than 50% respiratory variability, suggesting right atrial pressure of 3 mmHg. IAS/Shunts: No atrial level shunt detected by color flow Doppler. Agitated saline contrast was given intravenously to evaluate for intracardiac shunting.  LEFT VENTRICLE PLAX 2D LVIDd:         4.20 cm   Diastology LVIDs:         2.90 cm   LV e' medial:    7.72 cm/s LV PW:         1.10 cm   LV E/e' medial:  11.0 LV IVS:        1.20 cm   LV e' lateral:   9.36 cm/s LVOT diam:     1.90 cm   LV E/e' lateral: 9.1 LV SV:         55 LV SV Index:   26        2D Longitudinal Strain LVOT Area:     2.84 cm  2D Strain GLS Avg:     -19.0 %                           3D Volume EF:                          3D EF:        53 %                          LV EDV:       113 ml  LV ESV:       54 ml                          LV SV:        59 ml RIGHT VENTRICLE             IVC RV Basal diam:  4.40 cm    IVC diam: 2.00 cm RV S prime:     9.46 cm/s TAPSE (M-mode): 2.7 cm LEFT ATRIUM             Index        RIGHT ATRIUM           Index LA diam:        3.20 cm 1.53 cm/m   RA Area:     22.00 cm LA Vol (A2C):   47.3 ml 22.56 ml/m  RA Volume:   83.00 ml  39.58 ml/m LA Vol (A4C):   47.8 ml 22.80 ml/m LA Biplane Vol: 49.3 ml 23.51 ml/m  AORTIC VALVE AV Area (Vmax):    1.75 cm AV Area (Vmean):   1.74 cm AV Area (VTI):     1.74 cm AV Vmax:           141.00 cm/s AV Vmean:          94.100 cm/s AV VTI:            0.315 m AV Peak Grad:      8.0 mmHg AV Mean Grad:      4.0 mmHg LVOT Vmax:         87.00 cm/s LVOT Vmean:        57.600 cm/s LVOT VTI:          0.193 m LVOT/AV VTI ratio: 0.61  AORTA Ao Root diam: 2.90 cm Ao Asc diam:  2.40 cm MITRAL VALVE               TRICUSPID VALVE MV Area (PHT): 3.51 cm    TR Peak grad:   22.1 mmHg MV Decel Time: 216 msec    TR Vmax:        235.00 cm/s MV E velocity: 84.90 cm/s MV A velocity: 88.00 cm/s  SHUNTS MV E/A ratio:  0.96        Systemic VTI:  0.19 m                            Systemic Diam: 1.90 cm Kardie Tobb DO Electronically signed by Dub Huntsman DO Signature Date/Time: 08/11/2023/12:22:55 PM    Final    VAS US  LOWER EXTREMITY VENOUS (DVT) Result Date: 08/10/2023  Lower Venous DVT Study Patient Name:  CHENOA LUDDY  Date of Exam:   08/10/2023 Medical Rec #: [id number]           Accession #:    [id number] Date of Birth: October 08, 1970           Patient Gender: F Patient Age:   58 years Exam Location:  [facility] Procedure:      VAS US  LOWER EXTREMITY VENOUS (DVT) Referring Phys: LOLA JERNIGAN --------------------------------------------------------------------------------  Indications: Stroke.  Limitations: Poor ultrasound/tissue interface. Comparison Study: No previous exams Performing Technologist: Jody Hill RVT, RDMS  Examination Guidelines: A complete evaluation includes B-mode imaging, spectral Doppler, color Doppler, and  power Doppler as needed of all accessible portions of each vessel. Bilateral testing is considered an integral part of  a complete examination. Limited examinations for reoccurring indications may be performed as noted. The reflux portion of the exam is performed with the patient in reverse Trendelenburg.  +---------+---------------+---------+-----------+----------+-------------------+ RIGHT    CompressibilityPhasicitySpontaneityPropertiesThrombus Aging      +---------+---------------+---------+-----------+----------+-------------------+ CFV      Full           Yes      Yes                                      +---------+---------------+---------+-----------+----------+-------------------+ SFJ      Full                                                             +---------+---------------+---------+-----------+----------+-------------------+ FV Prox  Full           Yes      Yes                                      +---------+---------------+---------+-----------+----------+-------------------+ FV Mid   Full           Yes      Yes                                      +---------+---------------+---------+-----------+----------+-------------------+ FV DistalFull           Yes      Yes                                      +---------+---------------+---------+-----------+----------+-------------------+ PFV      Full                                                             +---------+---------------+---------+-----------+----------+-------------------+ POP      Full           Yes      Yes                                      +---------+---------------+---------+-----------+----------+-------------------+ PTV      Full                                                             +---------+---------------+---------+-----------+----------+-------------------+ PERO     Full                                         Not well visualized  +---------+---------------+---------+-----------+----------+-------------------+   +---------+---------------+---------+-----------+----------+-------------------+ LEFT  CompressibilityPhasicitySpontaneityPropertiesThrombus Aging      +---------+---------------+---------+-----------+----------+-------------------+ CFV      Full           Yes      Yes                                      +---------+---------------+---------+-----------+----------+-------------------+ SFJ      Full                                                             +---------+---------------+---------+-----------+----------+-------------------+ FV Prox  Full           Yes      Yes                                      +---------+---------------+---------+-----------+----------+-------------------+ FV Mid   Full           Yes      Yes                                      +---------+---------------+---------+-----------+----------+-------------------+ FV DistalFull           Yes      Yes                                      +---------+---------------+---------+-----------+----------+-------------------+ PFV      Full                                                             +---------+---------------+---------+-----------+----------+-------------------+ POP      Full           Yes      Yes                                      +---------+---------------+---------+-----------+----------+-------------------+ PTV      Full                                                             +---------+---------------+---------+-----------+----------+-------------------+ PERO     Full                                         Not well visualized +---------+---------------+---------+-----------+----------+-------------------+     Summary: BILATERAL: - No evidence of deep vein thrombosis seen in the lower extremities, bilaterally. -No evidence of popliteal cyst, bilaterally.   *See  table(s) above for measurements and observations. Electronically signed by  Gaile New MD on 08/10/2023 at 5:57:11 PM.    Final    VAS US  CAROTID (at [facility] and [facility] only) Result Date: 08/10/2023 Carotid Arterial Duplex Study Patient Name:  CHRISTIE VISCOMI  Date of Exam:   08/10/2023 Medical Rec #: [id number]           Accession #:    [id number] Date of Birth: 14-Oct-1970           Patient Gender: F Patient Age:   49 years Exam Location:  [facility] Procedure:      VAS US  CAROTID Referring Phys: LOLA JERNIGAN --------------------------------------------------------------------------------  Indications:       CVA. Risk Factors:      Hyperlipidemia, no history of smoking, prior CVA. Comparison Study:  No previous carotid duplex exams. CTA on 02/20/2021 showed                    carotid arteries WNL Performing Technologist: Ezzie Potters RVT, RDMS  Examination Guidelines: A complete evaluation includes B-mode imaging, spectral Doppler, color Doppler, and power Doppler as needed of all accessible portions of each vessel. Bilateral testing is considered an integral part of a complete examination. Limited examinations for reoccurring indications may be performed as noted.  Right Carotid Findings: +----------+--------+--------+--------+------------------+------------------+           PSV cm/sEDV cm/sStenosisPlaque DescriptionComments           +----------+--------+--------+--------+------------------+------------------+ CCA Prox  47      12                                                   +----------+--------+--------+--------+------------------+------------------+ CCA Distal51      17                                intimal thickening +----------+--------+--------+--------+------------------+------------------+ ICA Prox  46      16                                                   +----------+--------+--------+--------+------------------+------------------+ ICA Distal53      18                                 tortuous           +----------+--------+--------+--------+------------------+------------------+ ECA       61      10                                                   +----------+--------+--------+--------+------------------+------------------+ +----------+--------+-------+----------------+-------------------+           PSV cm/sEDV cmsDescribe        Arm Pressure (mmHG) +----------+--------+-------+----------------+-------------------+ Subclavian120            Multiphasic, WNL                    +----------+--------+-------+----------------+-------------------+ +---------+--------+--+--------+--+---------+ VertebralPSV cm/s35EDV cm/s13Antegrade +---------+--------+--+--------+--+---------+  Left Carotid Findings: +----------+--------+--------+--------+------------------+--------+  PSV cm/sEDV cm/sStenosisPlaque DescriptionComments +----------+--------+--------+--------+------------------+--------+ CCA Prox  91      18                                         +----------+--------+--------+--------+------------------+--------+ CCA Distal67      17                                         +----------+--------+--------+--------+------------------+--------+ ICA Prox  90      33                                tortuous +----------+--------+--------+--------+------------------+--------+ ICA Distal77      27                                tortuous +----------+--------+--------+--------+------------------+--------+ ECA       74      13                                         +----------+--------+--------+--------+------------------+--------+ +----------+--------+--------+----------------+-------------------+           PSV cm/sEDV cm/sDescribe        Arm Pressure (mmHG) +----------+--------+--------+----------------+-------------------+ [id number]              Multiphasic, WNL                     +----------+--------+--------+----------------+-------------------+ +---------+--------+--+--------+--+---------+ VertebralPSV cm/s63EDV cm/s21Antegrade +---------+--------+--+--------+--+---------+ Hypoechoic plaque seen in carotid bulb  Summary: Right Carotid: The extracranial vessels were near-normal with only minimal wall                thickening or plaque. Left Carotid: The extracranial vessels were near-normal with only minimal wall               thickening or plaque. Vertebrals:  Bilateral vertebral arteries demonstrate antegrade flow. Subclavians: Normal flow hemodynamics were seen in bilateral subclavian              arteries. *See table(s) above for measurements and observations.     Preliminary     Vitals:   08/10/23 2324 08/11/23 0445 08/11/23 0828 08/11/23 1124  BP: 122/80 108/87 (!) 133/90 (!) 145/84  Pulse: 75  65 88  Resp: 16 18 18 18   Temp: 98.1 F (36.7 C) 98.1 F (36.7 C) 98.1 F (36.7 C) 98.5 F (36.9 C)  TempSrc: Oral Oral Oral Oral  SpO2: 99% 100% 100% 100%  Weight:      Height:         PHYSICAL EXAM General:  Alert, well-nourished, well-developed patient in no acute distress Psych:  Mood and affect appropriate for situation CV: Regular rate and rhythm on monitor Respiratory:  Regular, unlabored respirations on room air GI: Abdomen soft and nontender   NEURO:  Mental Status: AA&Ox3, patient is able to give clear and coherent history Speech/Language: speech is without dysarthria or aphasia.  Naming, repetition, fluency, and comprehension intact.  Able to read a paragraph aloud without difficulty  Cranial Nerves:  II: PERRL. Visual fields full.  III, IV, VI: EOMI. Eyelids elevate symmetrically.  V: Sensation is  intact to light touch and symmetrical to face.  VII: Face is symmetrical resting and smiling VIII: hearing intact to voice. IX, X: Phonation is normal.  KP:Dynloizm shrug 5/5. XII: tongue is midline without fasciculations. Motor: 5/5  strength to all muscle groups tested.  Tone: is normal and bulk is normal Sensation- Intact to light touch bilaterally.  Coordination: FTN intact bilaterally Gait- deferred  Most Recent NIH 0   ASSESSMENT/PLAN  [name] is a 53 y.o. female with history of left MCA stroke and hyperlipidemia admitted for sudden onset difficulty reading.  Patient states that the symptoms have resolved and that she has returned to baseline.  MRI demonstrates acute left MCA territory infarct. NIH on Admission 1  Stroke:  left MCA territory infarct, embolic pattern, etiology: Cryptogenic Code Stroke CT head loss of gray-white differentiation within left parietal lobe suspicious for acute infarct ASPECTS 9 CTA head & neck nondiagnostic MRI acute infarct in left MCA territory involving left parietal and temporal lobes, chronic infarct in left frontal opercular region MRA no LVO or hemodynamically significant stenosis Carotid Doppler unremarkable 2D Echo EF 50 to 55%, severely dilated right atrium, normal left atrium, no atrial level shunt TCD bubble study no PFO LE venous Doppler negative for DVT Consider loop recorder prior to discharge Will also consider OCEANIC stroke trial LDL 87 HgbA1c 5.5 UDS negative Hypercoagulable workup pending VTE prophylaxis -Lovenox  aspirin  81 mg daily prior to admission, now on aspirin  81 mg daily and clopidogrel  75 mg daily for 3 weeks and then Plavix  alone. Therapy recommendations: None Disposition: Pending, likely home  Hx of Stroke/TIA 02/2021 presented with aphasia due to left M2 occlusion.  MRI showed left frontal infarct.  Transferred to [facility], status post IR with TICI2b.  Symptom resolved on discharge.  LDL 90, A1c 5.5, EF 55 to 39%.  TEE negative, no PFO.  30-day cardiac monitor was reportedly negative.  Discharged on aspirin  and Lipitor 40  Hypertension Home meds: None Stable Long-term BP goal normotensive  Hyperlipidemia Home meds:  Atorvastatin  80 mg daily, resumed in hospital LDL 87, goal < 70 Add ezetimibe  10 mg daily Continue statin and Zetia  at discharge  Other Stroke Risk Factors Obesity, Body mass index is 54.64 kg/m., BMI >/= 30 associated with increased stroke risk, recommend weight loss, diet and exercise as appropriate   Other Active Problems None  Hospital day # 1  Ary Cummins, MD PhD Stroke Neurology 08/11/2023 8:19 PM  I discussed with Dr. Cherlyn and EP. I spent extensive face-to-face time with the patient, more than 50% of which was spent in counseling and coordination of care, reviewing test results, images and medication, and discussing the diagnosis, treatment plan and potential prognosis. This patient's care requiresreview of multiple databases, neurological assessment, discussion with family, other specialists and medical decision making of high complexity.      To contact Stroke Continuity provider, please refer to [email or web address]. After hours, contact General Neurology

## 2023-08-11 NOTE — Evaluation (Signed)
 Occupational Therapy Evaluation Patient Details Name: Renee Hahn MRN: [id number] DOB: 01/15/1971 Today's Date: 08/11/2023   History of Present Illness Pt is a 53 y/o F admitted on 08/10/23 after presenting with transient difficulty reading/speaking. MRI showed acute infarct in the left MCA territory involving the left  parietal & temporal lobes. PMH: embolic L MCA stroke (August 7977), obesity, anemia, HLD, prediabetes   Clinical Impression   Patient was independent PTA with ADLs, working and driving.  Patient presents now as independent with all ADLs and is able to read without difficulty.  Visual assessment appeared WNL.  No further OT is recommended at this time.  Patients family was present and all agreed with OT D/C.         If plan is discharge home, recommend the following: Assist for transportation;Direct supervision/assist for medications management    Functional Status Assessment  Patient has had a recent decline in their functional status and demonstrates the ability to make significant improvements in function in a reasonable and predictable amount of time.  Equipment Recommendations  None recommended by OT    Recommendations for Other Services       Precautions / Restrictions Precautions Precautions: None      Mobility Bed Mobility Overal bed mobility: Independent                  Transfers Overall transfer level: Independent Equipment used: None                      Balance Overall balance assessment: Independent (Able to stand with eyes closed x30 seconds.)                                         ADL either performed or assessed with clinical judgement   ADL Overall ADL's : Independent;Modified independent                                             Vision Baseline Vision/History: 1 Wears glasses Ability to See in Adequate Light: 0 Adequate Patient Visual Report: No change from baseline Vision  Assessment?: Yes;No apparent visual deficits;Wears glasses for reading;Wears glasses for driving Eye Alignment: Within Functional Limits Ocular Range of Motion: Within Functional Limits Tracking/Visual Pursuits: Able to track stimulus in all quads without difficulty Saccades: Decreased speed of saccadic movement (a) Convergence: Within functional limits Visual Fields: No apparent deficits Additional Comments: Patient was able to speed up with visual testing. Able to read a paragraph.  Speed slightly decreased but stated this was her baseline.     Perception         Praxis         Pertinent Vitals/Pain Pain Assessment Pain Assessment: No/denies pain     Extremity/Trunk Assessment Upper Extremity Assessment Upper Extremity Assessment: Overall WFL for tasks assessed   Lower Extremity Assessment Lower Extremity Assessment: Defer to PT evaluation   Cervical / Trunk Assessment Cervical / Trunk Assessment: Other exceptions Cervical / Trunk Exceptions: overweight   Communication Communication Communication: No apparent difficulties   Cognition Arousal: Alert Behavior During Therapy: WFL for tasks assessed/performed Overall Cognitive Status: Within Functional Limits for tasks assessed  General Comments  Patient stated the words were not jumbled at all this afternoon    Exercises     Shoulder Instructions      Home Living Family/patient expects to be discharged to:: Private residence Living Arrangements: Other relatives Available Help at Discharge: Family;Available PRN/intermittently Type of Home: Apartment Home Access: Stairs to enter     Home Layout: One level     Bathroom Shower/Tub: Chief Strategy Officer: Standard Bathroom Accessibility: Yes How Accessible: Accessible via walker Home Equipment: Shower seat          Prior Functioning/Environment Prior Level of Function :  Independent/Modified Independent;Working/employed;Driving                        OT Problem List: Impaired vision/perception      OT Treatment/Interventions:      OT Goals(Current goals can be found in the care plan section) Acute Rehab OT Goals Patient Stated Goal: To return home OT Goal Formulation: All assessment and education complete, DC therapy  OT Frequency:      Co-evaluation              AM-PAC OT 6 Clicks Daily Activity     Outcome Measure Help from another person eating meals?: None Help from another person taking care of personal grooming?: None Help from another person toileting, which includes using toliet, bedpan, or urinal?: None Help from another person bathing (including washing, rinsing, drying)?: None Help from another person to put on and taking off regular upper body clothing?: None Help from another person to put on and taking off regular lower body clothing?: None 6 Click Score: 24   End of Session Nurse Communication: Mobility status  Activity Tolerance: Patient tolerated treatment well Patient left: in bed;with call bell/phone within reach;with family/visitor present (Patient sitting EOB)  OT Visit Diagnosis: Other symptoms and signs involving the nervous system (M70.101)                Time: 8371-8341 OT Time Calculation (min): 30 min Charges:  OT General Charges $OT Visit: 1 Visit OT Evaluation $OT Eval Low Complexity: 1 Low OT Treatments $Self Care/Home Management : 8-22 mins  Renee Hahn OTR/L   Renee Renee Hahn 08/11/2023, 4:09 PM

## 2023-08-11 NOTE — Plan of Care (Signed)
  Problem: Coping: Goal: Will identify appropriate support needs Outcome: Progressing   Problem: Coping: Goal: Will verbalize positive feelings about self Outcome: Progressing

## 2023-08-11 NOTE — Evaluation (Signed)
 Physical Therapy Evaluation and Discharge Patient Details Name: Renee Hahn MRN: [id number] DOB: 11/17/1970 Today's Date: 08/11/2023  History of Present Illness  Pt is a 53 y/o F admitted on 08/10/23 after presenting with transient difficulty reading/speaking. MRI showed acute infarct in the left MCA territory involving the left  parietal & temporal lobes. PMH: embolic L MCA stroke (August 7977), obesity, anemia, HLD, prediabetes  Clinical Impression   Patient evaluated by Physical Therapy with no further acute PT needs identified. Patient denied weakness or numbness. Continues to have some difficulty reading, but denies vision changes. Words get jumbled up Independent with balance and mobility. PT is signing off. Thank you for this referral.         If plan is discharge home, recommend the following:     Can travel by private vehicle        Equipment Recommendations None recommended by PT  Recommendations for Other Services  OT consult;Speech consult    Functional Status Assessment Patient has had a recent decline in their functional status and demonstrates the ability to make significant improvements in function in a reasonable and predictable amount of time.     Precautions / Restrictions Precautions Precautions: None      Mobility  Bed Mobility Overal bed mobility: Independent                  Transfers Overall transfer level: Independent Equipment used: None                    Ambulation/Gait Ambulation/Gait assistance: Independent Gait Distance (Feet): 40 Feet Assistive device: None Gait Pattern/deviations: WFL(Within Functional Limits)   Gait velocity interpretation: 1.31 - 2.62 ft/sec, indicative of limited community Medical Sales Representative     Tilt Bed    Modified Rankin (Stroke Patients Only) Modified Rankin (Stroke Patients Only) Pre-Morbid Rankin Score: No symptoms Modified Rankin: No  significant disability     Balance Overall balance assessment: Independent (eyes closed >10 sec; modified tandem 30 sec; SLS 10 sec)                                           Pertinent Vitals/Pain Pain Assessment Pain Assessment: Faces Faces Pain Scale: Hurts little more Pain Location: bottom Pain Descriptors / Indicators: Discomfort Pain Intervention(s): Limited activity within patient's tolerance    Home Living Family/patient expects to be discharged to:: Private residence Living Arrangements: Other relatives (brother) Available Help at Discharge: Family;Available PRN/intermittently Type of Home: Apartment Home Access: Stairs to enter   Entrance Stairs-Number of Steps: 30 (20 steps down, 10 steps up)   Home Layout: One level Home Equipment: Shower seat Additional Comments: got the shower seat a few weeks ago when had surgery on an abscess on her bottom    Prior Function Prior Level of Function : Independent/Modified Independent;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[employer])                     Extremity/Trunk Assessment   Upper Extremity Assessment Upper Extremity Assessment: Defer to OT evaluation    Lower Extremity Assessment Lower Extremity Assessment: Overall WFL for tasks assessed    Cervical / Trunk Assessment Cervical / Trunk Assessment: Other exceptions Cervical / Trunk Exceptions: overweight  Communication   Communication Communication: No apparent  difficulties  Cognition Arousal: Alert Behavior During Therapy: WFL for tasks assessed/performed Overall Cognitive Status: Within Functional Limits for tasks assessed                                          General Comments General comments (skin integrity, edema, etc.): Reports reading still has some words jumbled up but denies vision problem    Exercises     Assessment/Plan    PT Assessment Patient does not need any further PT services  PT Problem List          PT Treatment Interventions      PT Goals (Current goals can be found in the Care Plan section)  Acute Rehab PT Goals PT Goal Formulation: All assessment and education complete, DC therapy    Frequency       Co-evaluation               AM-PAC PT 6 Clicks Mobility  Outcome Measure Help needed turning from your back to your side while in a flat bed without using bedrails?: None Help needed moving from lying on your back to sitting on the side of a flat bed without using bedrails?: None Help needed moving to and from a bed to a chair (including a wheelchair)?: None Help needed standing up from a chair using your arms (e.g., wheelchair or bedside chair)?: None Help needed to walk in hospital room?: None Help needed climbing 3-5 steps with a railing? : None 6 Click Score: 24    End of Session   Activity Tolerance: Patient tolerated treatment well Patient left: in bed;with call bell/phone within reach (nsg has given permission to be up ad lib, no alarm needed; pt deferred sitting due to abscess on bottom) Nurse Communication: Other (comment) (no PT needs) PT Visit Diagnosis: Other symptoms and signs involving the nervous system (R29.898)    Time: 9194-9183 PT Time Calculation (min) (ACUTE ONLY): 11 min   Charges:   PT Evaluation $PT Eval Low Complexity: 1 Low   PT General Charges $$ ACUTE PT VISIT: 1 Visit          Macario RAMAN, PT Acute Rehabilitation Services  Office [phone]   Macario SHAUNNA Soja 08/11/2023, 8:25 AM

## 2023-08-11 NOTE — TOC CM/SW Note (Signed)
 Transition of Care [facility]) - Inpatient Brief Assessment   Patient Details  Name: Renee Hahn MRN: [id number] Date of Birth: Jan 28, 1971  [facility] CM/SW Contact:    Renee CHRISTELLA Goodie, LCSW Phone Number: 08/11/2023, 4:08 PM   Clinical Narrative:    Patient from home, no TOC needs identified at this time.     Transition of Care Asessment: Insurance and Status: Insurance coverage has been reviewed Patient has primary care physician: Yes Home environment has been reviewed: Home with relatives Prior level of function:: Independent Prior/Current Home Services: No current home services Social Drivers of Health Review: SDOH reviewed no interventions necessary Readmission risk has been reviewed: Yes Transition of care needs: no transition of care needs at this time

## 2023-08-11 NOTE — Progress Notes (Signed)
 [facility]                                                                               Hallel Denherder, is a 53 y.o. female, DOB - [birth year], FMW:[id number] Admit date - 08/10/2023    Outpatient Primary MD for the patient is Nche, Roselie Rockford, NP  LOS - 1  days    Brief summary   Omelia E Kimmons is a 53 y.o. female with medical history significant of class III obesity, cholelithiasis, high fever, headache, hyperlipidemia, iron  deficiency anemia, menorrhagia, prediabetes, history of nonhemorrhagic CVA, vitamin B12 deficiency who presented to the emergency department with aphasia.  CT head without contrast showed loss of gray-white differentiation within the left parietal lobe (within the posterior left MCA vascular territory) suspicious for acute infarct.  Chronic cortical/subcortical left MCA territory infarct within the left frontal operculum and insula.  CTA head and neck could not be adequately assess due to poor quality arterial contrast bolus.  MRI brain and MRA head show an acute infarct in the left MCA territory involving the left parietal and temporal lobes.    Assessment & Plan    Assessment and Plan:    Acute infarct in the left MCA territory involving the left parietal and temporal lobes - reviewed CT, MRI and MRA of the head.  - US  carotids duplex reviewed.  Echocardiogram showed LVEF of 50 to 55%.  Venous duplex of the lower extremities No evidence of deep vein thrombosis seen in the lower extremities,  bilaterally.  LDL is 87, A1c is 5.5%, UDS Is negative.  Neurology on board.  TCD and TEE to be scheduled.  Continue with aspirin  and plavix  and statin.     Body mass index is 54.64 kg/m. Morbid Obesity.     Mild iron  deficiency anemia Hemoglobin around 11.     Estimated body mass index is 54.64 kg/m as calculated from the following:   Height as of this encounter: 4' 11 (1.499 m).   Weight as of this encounter: 122.7 kg.  Code  Status: full code.  DVT Prophylaxis:  enoxaparin  (LOVENOX ) injection 40 mg Start: 08/10/23 2200   Level of Care: Level of care: Telemetry Medical Family Communication: none at bedside.   Disposition Plan:     Remains inpatient appropriate:  further work up with TEE.   Procedures:  Echo.   Consultants:   Neurology.   Antimicrobials:   Anti-infectives (From admission, onward)    None        Medications  Scheduled Meds:  aspirin  EC  81 mg Oral Daily   atorvastatin   80 mg Oral Daily   clopidogrel   75 mg Oral Daily   enoxaparin  (LOVENOX ) injection  40 mg Subcutaneous Q24H   Continuous Infusions: PRN Meds:.acetaminophen  **OR** acetaminophen , ondansetron  **OR** ondansetron  (ZOFRAN ) IV, polyethylene glycol, senna-docusate    Subjective:   Shahara Hartsfield was seen and examined today.  She is back to baseline.   Objective:   Vitals:   08/10/23 2324 08/11/23 0445 08/11/23 0828 08/11/23 1124  BP: 122/80 108/87 (!) 133/90 (!) 145/84  Pulse: 75  65 88  Resp: 16 18 18 18   Temp: 98.1 F (36.7  C) 98.1 F (36.7 C) 98.1 F (36.7 C) 98.5 F (36.9 C)  TempSrc: Oral Oral Oral Oral  SpO2: 99% 100% 100% 100%  Weight:      Height:       No intake or output data in the 24 hours ending 08/11/23 1350 Filed Weights   08/10/23 0809  Weight: 122.7 kg     Exam General exam: Appears calm and comfortable  Respiratory system: Clear to auscultation. Respiratory effort normal. Cardiovascular system: S1 & S2 heard, RRR. No JVD, Gastrointestinal system: Abdomen is nondistended, soft and nontender.  Central nervous system: Alert and oriented.  Extremities: Symmetric 5 x 5 power. Skin: No rashes, Psychiatry:  Mood & affect appropriate.     Data Reviewed:  I have personally reviewed following labs and imaging studies   CBC Lab Results  Component Value Date   WBC 7.5 08/11/2023   RBC 4.83 08/11/2023   HGB 11.4 (L) 08/11/2023   HCT 36.6 08/11/2023   MCV 75.8 (L)  08/11/2023   MCH 23.6 (L) 08/11/2023   PLT 251 08/11/2023   MCHC 31.1 08/11/2023   RDW 16.0 (H) 08/11/2023   LYMPHSABS 1.5 08/10/2023   MONOABS 0.5 08/10/2023   EOSABS 0.1 08/10/2023   BASOSABS 0.0 08/10/2023     Last metabolic panel Lab Results  Component Value Date   NA 141 08/11/2023   K 3.7 08/11/2023   CL 106 08/11/2023   CO2 25 08/11/2023   BUN 9 08/11/2023   CREATININE 0.97 08/11/2023   GLUCOSE 101 (H) 08/11/2023   GFRNONAA >60 08/11/2023   GFRAA >60 12/18/2019   CALCIUM  9.0 08/11/2023   PROT 6.1 (L) 08/11/2023   ALBUMIN 2.7 (L) 08/11/2023   LABGLOB 3.6 12/18/2019   AGRATIO 0.9 12/18/2019   BILITOT 0.9 08/11/2023   ALKPHOS 83 08/11/2023   AST 32 08/11/2023   ALT 24 08/11/2023   ANIONGAP 10 08/11/2023    CBG (last 3)  Recent Labs    08/10/23 0852 08/11/23 1122  GLUCAP 88 105*      Coagulation Profile: Recent Labs  Lab 08/10/23 0854  INR 1.0     Radiology Studies: ECHOCARDIOGRAM COMPLETE Result Date: 08/11/2023    ECHOCARDIOGRAM REPORT   Patient Name:   LATRENDA IRANI Date of Exam: 08/11/2023 Medical Rec #:  [id number]          Height:       59.0 in Accession #:    [id number]         Weight:       270.5 lb Date of Birth:  [birth year]          BSA:          2.097 m Patient Age:    52 years           BP:           108/87 mmHg Patient Gender: F                  HR:           70 bpm. Exam Location:  Inpatient Procedure: 2D Echo, 3D Echo, Cardiac Doppler, Color Doppler, Strain Analysis and            Saline Contrast Bubble Study Indications:    Stroke  History:        Patient has no prior history of Echocardiogram examinations.                 Risk Factors:Hypertension and Dyslipidemia.  Sonographer:    Ozell Free Referring Phys: [id number] SRISHTI L BHAGAT IMPRESSIONS  1. Left ventricular ejection fraction, by estimation, is 50 to 55%. The left ventricle has low normal function. The left ventricle has no regional wall motion abnormalities. There is mild concentric  left ventricular hypertrophy. Left ventricular diastolic parameters are indeterminate. The average left ventricular global longitudinal strain is -19.0 %. The global longitudinal strain is normal.  2. Right ventricular systolic function is normal. The right ventricular size is mildly enlarged. There is normal pulmonary artery systolic pressure.  3. Right atrial size was severely dilated.  4. The mitral valve is normal in structure. No evidence of mitral valve regurgitation. No evidence of mitral stenosis.  5. The aortic valve is normal in structure. Aortic valve regurgitation is not visualized. Aortic valve sclerosis/calcification is present, without any evidence of aortic stenosis.  6. The inferior vena cava is normal in size with greater than 50% respiratory variability, suggesting right atrial pressure of 3 mmHg. FINDINGS  Left Ventricle: Left ventricular ejection fraction, by estimation, is 50 to 55%. The left ventricle has low normal function. The left ventricle has no regional wall motion abnormalities. The average left ventricular global longitudinal strain is -19.0 %. The global longitudinal strain is normal. The left ventricular internal cavity size was normal in size. There is mild concentric left ventricular hypertrophy. Left ventricular diastolic parameters are indeterminate. Right Ventricle: The right ventricular size is mildly enlarged. No increase in right ventricular wall thickness. Right ventricular systolic function is normal. There is normal pulmonary artery systolic pressure. The tricuspid regurgitant velocity is 2.35  m/s, and with an assumed right atrial pressure of 8 mmHg, the estimated right ventricular systolic pressure is 30.1 mmHg. Left Atrium: Left atrial size was normal in size. Right Atrium: Right atrial size was severely dilated. Pericardium: There is no evidence of pericardial effusion. Mitral Valve: The mitral valve is normal in structure. No evidence of mitral valve regurgitation.  No evidence of mitral valve stenosis. Tricuspid Valve: The tricuspid valve is normal in structure. Tricuspid valve regurgitation is not demonstrated. No evidence of tricuspid stenosis. Aortic Valve: The aortic valve is normal in structure. Aortic valve regurgitation is not visualized. Aortic valve sclerosis/calcification is present, without any evidence of aortic stenosis. Aortic valve mean gradient measures 4.0 mmHg. Aortic valve peak  gradient measures 8.0 mmHg. Aortic valve area, by VTI measures 1.74 cm. Pulmonic Valve: The pulmonic valve was normal in structure. Pulmonic valve regurgitation is not visualized. No evidence of pulmonic stenosis. Aorta: The aortic root is normal in size and structure. Venous: The inferior vena cava is normal in size with greater than 50% respiratory variability, suggesting right atrial pressure of 3 mmHg. IAS/Shunts: No atrial level shunt detected by color flow Doppler. Agitated saline contrast was given intravenously to evaluate for intracardiac shunting.  LEFT VENTRICLE PLAX 2D LVIDd:         4.20 cm   Diastology LVIDs:         2.90 cm   LV e' medial:    7.72 cm/s LV PW:         1.10 cm   LV E/e' medial:  11.0 LV IVS:        1.20 cm   LV e' lateral:   9.36 cm/s LVOT diam:     1.90 cm   LV E/e' lateral: 9.1 LV SV:         55 LV SV Index:   26  2D Longitudinal Strain LVOT Area:     2.84 cm  2D Strain GLS Avg:     -19.0 %                           3D Volume EF:                          3D EF:        53 %                          LV EDV:       113 ml                          LV ESV:       54 ml                          LV SV:        59 ml RIGHT VENTRICLE            IVC RV Basal diam:  4.40 cm    IVC diam: 2.00 cm RV S prime:     9.46 cm/s TAPSE (M-mode): 2.7 cm LEFT ATRIUM             Index        RIGHT ATRIUM           Index LA diam:        3.20 cm 1.53 cm/m   RA Area:     22.00 cm LA Vol (A2C):   47.3 ml 22.56 ml/m  RA Volume:   83.00 ml  39.58 ml/m LA Vol (A4C):   47.8  ml 22.80 ml/m LA Biplane Vol: 49.3 ml 23.51 ml/m  AORTIC VALVE AV Area (Vmax):    1.75 cm AV Area (Vmean):   1.74 cm AV Area (VTI):     1.74 cm AV Vmax:           141.00 cm/s AV Vmean:          94.100 cm/s AV VTI:            0.315 m AV Peak Grad:      8.0 mmHg AV Mean Grad:      4.0 mmHg LVOT Vmax:         87.00 cm/s LVOT Vmean:        57.600 cm/s LVOT VTI:          0.193 m LVOT/AV VTI ratio: 0.61  AORTA Ao Root diam: 2.90 cm Ao Asc diam:  2.40 cm MITRAL VALVE               TRICUSPID VALVE MV Area (PHT): 3.51 cm    TR Peak grad:   22.1 mmHg MV Decel Time: 216 msec    TR Vmax:        235.00 cm/s MV E velocity: 84.90 cm/s MV A velocity: 88.00 cm/s  SHUNTS MV E/A ratio:  0.96        Systemic VTI:  0.19 m                            Systemic Diam: 1.90 cm Kardie Tobb DO Electronically signed by Dub Huntsman DO Signature Date/Time: 08/11/2023/12:22:55 PM    Final  VAS US  LOWER EXTREMITY VENOUS (DVT) Result Date: 08/10/2023  Lower Venous DVT Study Patient Name:  CHANDI NICKLIN  Date of Exam:   08/10/2023 Medical Rec #: [id number]           Accession #:    [id number] Date of Birth: 08-25-1970           Patient Gender: F Patient Age:   52 years Exam Location:  [facility] Procedure:      VAS US  LOWER EXTREMITY VENOUS (DVT) Referring Phys: LOLA JERNIGAN --------------------------------------------------------------------------------  Indications: Stroke.  Limitations: Poor ultrasound/tissue interface. Comparison Study: No previous exams Performing Technologist: Jody Hill RVT, RDMS  Examination Guidelines: A complete evaluation includes B-mode imaging, spectral Doppler, color Doppler, and power Doppler as needed of all accessible portions of each vessel. Bilateral testing is considered an integral part of a complete examination. Limited examinations for reoccurring indications may be performed as noted. The reflux portion of the exam is performed with the patient in reverse Trendelenburg.   +---------+---------------+---------+-----------+----------+-------------------+ RIGHT    CompressibilityPhasicitySpontaneityPropertiesThrombus Aging      +---------+---------------+---------+-----------+----------+-------------------+ CFV      Full           Yes      Yes                                      +---------+---------------+---------+-----------+----------+-------------------+ SFJ      Full                                                             +---------+---------------+---------+-----------+----------+-------------------+ FV Prox  Full           Yes      Yes                                      +---------+---------------+---------+-----------+----------+-------------------+ FV Mid   Full           Yes      Yes                                      +---------+---------------+---------+-----------+----------+-------------------+ FV DistalFull           Yes      Yes                                      +---------+---------------+---------+-----------+----------+-------------------+ PFV      Full                                                             +---------+---------------+---------+-----------+----------+-------------------+ POP      Full           Yes      Yes                                      +---------+---------------+---------+-----------+----------+-------------------+  PTV      Full                                                             +---------+---------------+---------+-----------+----------+-------------------+ PERO     Full                                         Not well visualized +---------+---------------+---------+-----------+----------+-------------------+   +---------+---------------+---------+-----------+----------+-------------------+ LEFT     CompressibilityPhasicitySpontaneityPropertiesThrombus Aging      +---------+---------------+---------+-----------+----------+-------------------+  CFV      Full           Yes      Yes                                      +---------+---------------+---------+-----------+----------+-------------------+ SFJ      Full                                                             +---------+---------------+---------+-----------+----------+-------------------+ FV Prox  Full           Yes      Yes                                      +---------+---------------+---------+-----------+----------+-------------------+ FV Mid   Full           Yes      Yes                                      +---------+---------------+---------+-----------+----------+-------------------+ FV DistalFull           Yes      Yes                                      +---------+---------------+---------+-----------+----------+-------------------+ PFV      Full                                                             +---------+---------------+---------+-----------+----------+-------------------+ POP      Full           Yes      Yes                                      +---------+---------------+---------+-----------+----------+-------------------+ PTV      Full                                                             +---------+---------------+---------+-----------+----------+-------------------+  PERO     Full                                         Not well visualized +---------+---------------+---------+-----------+----------+-------------------+     Summary: BILATERAL: - No evidence of deep vein thrombosis seen in the lower extremities, bilaterally. -No evidence of popliteal cyst, bilaterally.   *See table(s) above for measurements and observations. Electronically signed by Gaile New MD on 08/10/2023 at 5:57:11 PM.    Final    VAS US  CAROTID (at [facility] and [facility] only) Result Date: 08/10/2023 Carotid Arterial Duplex Study Patient Name:  MARGEL JOENS  Date of Exam:   08/10/2023 Medical Rec #: [id number]           Accession #:     [id number] Date of Birth: Nov 19, 1970           Patient Gender: F Patient Age:   21 years Exam Location:  [facility] Procedure:      VAS US  CAROTID Referring Phys: LOLA JERNIGAN --------------------------------------------------------------------------------  Indications:       CVA. Risk Factors:      Hyperlipidemia, no history of smoking, prior CVA. Comparison Study:  No previous carotid duplex exams. CTA on 02/20/2021 showed                    carotid arteries WNL Performing Technologist: Ezzie Potters RVT, RDMS  Examination Guidelines: A complete evaluation includes B-mode imaging, spectral Doppler, color Doppler, and power Doppler as needed of all accessible portions of each vessel. Bilateral testing is considered an integral part of a complete examination. Limited examinations for reoccurring indications may be performed as noted.  Right Carotid Findings: +----------+--------+--------+--------+------------------+------------------+           PSV cm/sEDV cm/sStenosisPlaque DescriptionComments           +----------+--------+--------+--------+------------------+------------------+ CCA Prox  47      12                                                   +----------+--------+--------+--------+------------------+------------------+ CCA Distal51      17                                intimal thickening +----------+--------+--------+--------+------------------+------------------+ ICA Prox  46      16                                                   +----------+--------+--------+--------+------------------+------------------+ ICA Distal53      18                                tortuous           +----------+--------+--------+--------+------------------+------------------+ ECA       61      10                                                   +----------+--------+--------+--------+------------------+------------------+  +----------+--------+-------+----------------+-------------------+  PSV cm/sEDV cmsDescribe        Arm Pressure (mmHG) +----------+--------+-------+----------------+-------------------+ Subclavian120            Multiphasic, WNL                    +----------+--------+-------+----------------+-------------------+ +---------+--------+--+--------+--+---------+ VertebralPSV cm/s35EDV cm/s13Antegrade +---------+--------+--+--------+--+---------+  Left Carotid Findings: +----------+--------+--------+--------+------------------+--------+           PSV cm/sEDV cm/sStenosisPlaque DescriptionComments +----------+--------+--------+--------+------------------+--------+ CCA Prox  91      18                                         +----------+--------+--------+--------+------------------+--------+ CCA Distal67      17                                         +----------+--------+--------+--------+------------------+--------+ ICA Prox  90      33                                tortuous +----------+--------+--------+--------+------------------+--------+ ICA Distal77      27                                tortuous +----------+--------+--------+--------+------------------+--------+ ECA       74      13                                         +----------+--------+--------+--------+------------------+--------+ +----------+--------+--------+----------------+-------------------+           PSV cm/sEDV cm/sDescribe        Arm Pressure (mmHG) +----------+--------+--------+----------------+-------------------+ [id number]              Multiphasic, WNL                    +----------+--------+--------+----------------+-------------------+ +---------+--------+--+--------+--+---------+ VertebralPSV cm/s63EDV cm/s21Antegrade +---------+--------+--+--------+--+---------+ Hypoechoic plaque seen in carotid bulb  Summary: Right Carotid: The extracranial vessels  were near-normal with only minimal wall                thickening or plaque. Left Carotid: The extracranial vessels were near-normal with only minimal wall               thickening or plaque. Vertebrals:  Bilateral vertebral arteries demonstrate antegrade flow. Subclavians: Normal flow hemodynamics were seen in bilateral subclavian              arteries. *See table(s) above for measurements and observations.     Preliminary    MR BRAIN WO CONTRAST Result Date: 08/10/2023 CLINICAL DATA:  Seizure, new-onset, no history of trauma post-stroke epilepsy suspected vs. new stroke; Neuro deficit, acute, stroke suspected EXAM: MRI HEAD WITHOUT CONTRAST MRA HEAD WITHOUT CONTRAST TECHNIQUE: Multiplanar, multi-echo pulse sequences of the brain and surrounding structures were acquired without intravenous contrast. Angiographic images of the Circle of Willis were acquired using MRA technique without intravenous contrast. COMPARISON:  Same-day CT head and head and neck angiogram FINDINGS: MRI HEAD FINDINGS Brain: There is an acute infarcts in the left MCA territory involving the left parietal and temporal lobes. No hemorrhage. No hydrocephalus. No extra-axial fluid collection. No mass effect.  There is chronic infarct in the left frontal opercular region. Vascular: See below Skull and upper cervical spine: Normal marrow signal. Sinuses/Orbits: No middle ear or mastoid effusion. Paranasal sinuses are clear. Orbits are unremarkable. Other: None MRA HEAD FINDINGS Anterior circulation: No aneurysm. No vascular malformation. No proximal occlusion. No significant stenosis Posterior circulation: No aneurysm. No vascular malformation. No proximal occlusion. No significant stenosis Anatomic variants: None IMPRESSION: 1. Acute infarct in the left MCA territory involving the left parietal and temporal lobes. No hemorrhage. 2. Chronic infarct in the left frontal opercular region. 3. No intracranial large vessel occlusion or significant  stenosis. Findings were paged to Dr. Jerrie on 08/11/23 at 11:17 AM Electronically Signed   By: Lyndall Gore M.D.   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 new stroke; Neuro deficit, acute, stroke suspected EXAM: MRI HEAD WITHOUT CONTRAST MRA HEAD WITHOUT CONTRAST TECHNIQUE: Multiplanar, multi-echo pulse sequences of the brain and surrounding structures were acquired without intravenous contrast. Angiographic images of the Circle of Willis were acquired using MRA technique without intravenous contrast. COMPARISON:  Same-day CT head and head and neck angiogram FINDINGS: MRI HEAD FINDINGS Brain: There is an acute infarcts in the left MCA territory involving the left parietal and temporal lobes. No hemorrhage. No hydrocephalus. No extra-axial fluid collection. No mass effect. There is chronic infarct in the left frontal opercular region. Vascular: See below Skull and upper cervical spine: Normal marrow signal. Sinuses/Orbits: No middle ear or mastoid effusion. Paranasal sinuses are clear. Orbits are unremarkable. Other: None MRA HEAD FINDINGS Anterior circulation: No aneurysm. No vascular malformation. No proximal occlusion. No significant stenosis Posterior circulation: No aneurysm. No vascular malformation. No proximal occlusion. No significant stenosis Anatomic variants: None IMPRESSION: 1. Acute infarct in the left MCA territory involving the left parietal and temporal lobes. No hemorrhage. 2. Chronic infarct in the left frontal opercular region. 3. No intracranial large vessel occlusion or significant stenosis. Findings were paged to Dr. Jerrie on 08/11/23 at 11:17 AM Electronically Signed   By: Lyndall Gore M.D.   On: 08/10/2023 11:17   CT ANGIO HEAD NECK W WO CM Result Date: 08/10/2023 CLINICAL DATA:  Provided history: Stroke/TIA, determine embolic source. EXAM: CT ANGIOGRAPHY HEAD AND  NECK WITH AND WITHOUT CONTRAST TECHNIQUE: Multidetector CT imaging of the head and neck was performed using the standard protocol during bolus administration of intravenous contrast. Multiplanar CT image reconstructions and MIPs were obtained to evaluate the vascular anatomy. Carotid stenosis measurements (when applicable) are obtained utilizing NASCET criteria, using the distal internal carotid diameter as the denominator. RADIATION DOSE REDUCTION: This exam was performed according to the departmental dose-optimization program which includes automated exposure control, adjustment of the mA and/or kV according to patient size and/or use of iterative reconstruction technique. CONTRAST:  75mL OMNIPAQUE  IOHEXOL  350 MG/ML SOLN COMPARISON:  Non-contrast head CT performed earlier today 08/10/2023. CT angiogram head/neck 02/20/2021. FINDINGS: CTA NECK FINDINGS Limited evaluation due to a poor quality arterial contrast bolus and due to motion degradation. Within these limitations, findings are as follows. Aortic arch: The visualized thoracic aorta is normal in caliber. Streak/beam hardening artifact arising from a dense left-sided contrast bolus partially obscures the left subclavian artery. No definite hemodynamically significant stenosis of the innominate or visible proximal subclavian arteries. Right carotid system: Cannot be assessed due to a poor quality arterial contrast bolus. Left carotid system: Cannot be assessed due  to a poor quality arterial contrast bolus. Vertebral arteries: Cannot be assessed due to a poor quality arterial contrast bolus. Skeleton: Poor dentition. Elsewhere, no acute fracture or aggressive osseous lesion. Other neck: Unremarkable. Upper chest: No consolidation within the imaged lung apices. Review of the MIP images confirms the above findings CTA HEAD FINDINGS Anterior circulation: The anterior intracranial arterial circulation cannot be adequately assessed due to a poor quality arterial  contrast bolus. Posterior circulation: The posterior intracranial arterial circulation cannot be adequately assessed due to a poor quality arterial contrast bolus. Venous sinuses: Within the limitations of contrast timing, no convincing thrombus. Anatomic variants: Cannot be assessed as described above. Review of the MIP images confirms the above findings These results were called by telephone at the time of interpretation on 08/10/2023 at 9:40 am to provider [facility] , who verbally acknowledged these results. IMPRESSION: CTA neck: The common carotid, internal carotid and vertebral arteries cannot be adequately assessed due to a a poor quality arterial contrast bolus. CTA head: The intracranial anterior and posterior arterial circulation cannot be adequately assessed due to a poor quality arterial contrast bolus. Electronically Signed   By: Rockey Childs D.O.   On: 08/10/2023 10:24   CT HEAD CODE STROKE WO CONTRAST Result Date: 08/10/2023 CLINICAL DATA:  Code stroke. Provided history: Neuro deficit, acute, stroke suspected. EXAM: CT HEAD WITHOUT CONTRAST TECHNIQUE: Contiguous axial images were obtained from the base of the skull through the vertex without intravenous contrast. RADIATION DOSE REDUCTION: This exam was performed according to the departmental dose-optimization program which includes automated exposure control, adjustment of the mA and/or kV according to patient size and/or use of iterative reconstruction technique. COMPARISON:  Head CT 02/20/2021. FINDINGS: Brain: No age-advanced or lobar predominant parenchymal atrophy. Loss of gray-white differentiation within the left parietal lobe within the posterior left MCA vascular territory suspicious for an acute infarct (for instance as seen on series 2, image 17 Chronic cortical/subcortical infarct within the left frontal operculum and left insula (MCA vascular territory).). There is no acute intracranial hemorrhage. No extra-axial fluid collection. No  evidence of an intracranial mass. No midline shift. Vascular: No hyperdense vessel.  Atherosclerotic calcifications. Skull: No calvarial fracture or aggressive osseous lesion. Sinuses/Orbits: No mass or acute finding within the imaged orbits. No significant paranasal sinus disease at the imaged levels. ASPECTS [facility] Stroke Program Early CT Score) - Ganglionic level infarction (caudate, lentiform nuclei, internal capsule, insula, M1-M3 cortex): 7 - Supraganglionic infarction (M4-M6 cortex): 2 Total score (0-10 with 10 being normal): 9 Attempts are being made to reach the ordering provider at this time regarding impression #1. IMPRESSION: 1. Loss of gray-white differentiation within the left parietal lobe (within the posterior left MCA vascular territory) suspicious for acute infarct. ASPECTS is 9. 2. Chronic cortical/subcortical left MCA territory infarct within the left frontal operculum and insula. Electronically Signed   By: Rockey Childs D.O.   On: 08/10/2023 09:41       Elgie Butter M.D. [facility] 08/11/2023, 1:50 PM  Available via Epic secure chat 7am-7pm After 7 pm, please refer to night coverage provider listed on amion.

## 2023-08-12 ENCOUNTER — Encounter: Payer: Self-pay | Admitting: Neurology

## 2023-08-12 ENCOUNTER — Encounter (HOSPITAL_COMMUNITY)
Admission: EM | Disposition: A | Discharge status: Home / Self Care | Payer: No Typology Code available for payment source | Source: Home / Self Care | Attending: Internal Medicine

## 2023-08-12 DIAGNOSIS — E66813 Obesity, class 3: Secondary | ICD-10-CM | POA: Diagnosis not present

## 2023-08-12 DIAGNOSIS — I639 Cerebral infarction, unspecified: Secondary | ICD-10-CM | POA: Diagnosis not present

## 2023-08-12 DIAGNOSIS — I10 Essential (primary) hypertension: Secondary | ICD-10-CM | POA: Diagnosis not present

## 2023-08-12 DIAGNOSIS — R4701 Aphasia: Secondary | ICD-10-CM | POA: Diagnosis not present

## 2023-08-12 HISTORY — PX: LOOP RECORDER INSERTION: EP1214

## 2023-08-12 LAB — HOMOCYSTEINE: Homocysteine: 14.2 umol/L (ref 0.0–14.5)

## 2023-08-12 LAB — PROTEIN C, TOTAL: Protein C, Total: 81 % (ref 60–150)

## 2023-08-12 LAB — PROTEIN S, TOTAL: Protein S Ag, Total: 77 % (ref 60–150)

## 2023-08-12 LAB — CARDIOLIPIN ANTIBODIES, IGG, IGM, IGA
Anticardiolipin IgA: <9 [APL'U]/mL (ref 0–11)
Anticardiolipin IgG: <9 [GPL'U]/mL (ref 0–14)
Anticardiolipin IgM: <9 [MPL'U]/mL (ref 0–12)

## 2023-08-12 LAB — PROTEIN S ACTIVITY: Protein S Activity: 64 % (ref 63–140)

## 2023-08-12 LAB — BETA-2-GLYCOPROTEIN I ABS, IGG/M/A
Beta-2 Glyco I IgG: <9 GPI IgG units (ref 0–20)
Beta-2-Glycoprotein I IgA: <9 GPI IgA units (ref 0–25)
Beta-2-Glycoprotein I IgM: <9 GPI IgM units (ref 0–32)

## 2023-08-12 LAB — PROTEIN C ACTIVITY: Protein C Activity: 94 % (ref 73–180)

## 2023-08-12 SURGERY — LOOP RECORDER INSERTION
Anesthesia: LOCAL

## 2023-08-12 MED ORDER — CLOPIDOGREL BISULFATE 75 MG PO TABS: 75.0000 mg | ORAL_TABLET | Freq: Every day | ORAL | 0 refills | Status: DC

## 2023-08-12 MED ORDER — STUDY - OCEANIC-STROKE - ASUNDEXIAN 50 MG OR PLACEBO TABLET (PI-SETHI)
1.0000 | ORAL_TABLET | Freq: Every day | ORAL | Status: DC
  Filled 2023-08-12: qty 1

## 2023-08-12 MED ORDER — STUDY - OCEANIC-STROKE - ASUNDEXIAN 50 MG OR PLACEBO TABLET (PI-SETHI)
1.0000 | ORAL_TABLET | Freq: Every day | ORAL | Status: DC
  Administered 2023-08-12: 50 mg via ORAL
  Filled 2023-08-12: qty 1

## 2023-08-12 MED ORDER — ASPIRIN 81 MG PO TBEC: 81.0000 mg | DELAYED_RELEASE_TABLET | Freq: Every day | ORAL | 0 refills | Status: AC

## 2023-08-12 MED ORDER — STUDY - OCEANIC-STROKE - ASUNDEXIAN 50 MG OR PLACEBO TABLET (PI-SETHI): 1.0000 | ORAL_TABLET | Freq: Every day | ORAL | 0 refills | Status: DC

## 2023-08-12 MED ORDER — LIDOCAINE-EPINEPHRINE 1 %-1:100000 IJ SOLN
INTRAMUSCULAR | Status: DC | PRN
  Administered 2023-08-12: 20 mL

## 2023-08-12 MED ORDER — EZETIMIBE 10 MG PO TABS: 10.0000 mg | ORAL_TABLET | Freq: Every day | ORAL | 3 refills | Status: DC

## 2023-08-12 MED ORDER — LIDOCAINE-EPINEPHRINE 1 %-1:100000 IJ SOLN
INTRAMUSCULAR | Status: AC
  Filled 2023-08-12: qty 1

## 2023-08-12 SURGICAL SUPPLY — 2 items
MONITOR REVEAL LINQ II (Prosthesis & Implant Heart) IMPLANT
PACK LOOP INSERTION (CUSTOM PROCEDURE TRAY) ×2 IMPLANT

## 2023-08-12 NOTE — Plan of Care (Signed)
  Problem: Education: Goal: Knowledge of disease or condition will improve Outcome: Progressing Goal: Knowledge of secondary prevention will improve (MUST DOCUMENT ALL) Outcome: Progressing Goal: Knowledge of patient specific risk factors will improve (DELETE if not current risk factor) Outcome: Progressing   Problem: Ischemic Stroke/TIA Tissue Perfusion: Goal: Complications of ischemic stroke/TIA will be minimized Outcome: Progressing   Problem: Coping: Goal: Will verbalize positive feelings about self Outcome: Progressing Goal: Will identify appropriate support needs Outcome: Progressing   Problem: Health Behavior/Discharge Planning: Goal: Ability to manage health-related needs will improve Outcome: Progressing Goal: Goals will be collaboratively established with patient/family Outcome: Progressing   Problem: Self-Care: Goal: Ability to participate in self-care as condition permits will improve Outcome: Progressing Goal: Verbalization of feelings and concerns over difficulty with self-care will improve Outcome: Progressing Goal: Ability to communicate needs accurately will improve Outcome: Progressing

## 2023-08-12 NOTE — TOC Transition Note (Signed)
 Transition of Care [facility]) - Discharge Note   Patient Details  Name: Renee Hahn MRN: [id number] Date of Birth: 05-Jan-1971  [facility] CM/SW Contact:  Andrez JULIANNA George, RN Phone Number: 08/12/2023, 3:53 PM   Clinical Narrative:     Pt is discharging home with self care. No needs per TOC.  Final next level of care: Home/Self Care Barriers to Discharge: No Barriers Identified   Patient Goals and CMS Choice            Discharge Placement                       Discharge Plan and Services Additional resources added to the After Visit Summary for                                       Social Drivers of Health (SDOH) Interventions SDOH Screenings   Food Insecurity: No Food Insecurity (08/10/2023)  Housing: Low Risk  (08/10/2023)  Transportation Needs: No Transportation Needs (08/10/2023)  Utilities: Not At Risk (08/10/2023)  Depression (PHQ2-9): Low Risk  (07/18/2023)  Recent Concern: Depression (PHQ2-9) - Medium Risk (06/16/2023)  Financial Resource Strain: Medium Risk (03/03/2021)   Received from [facility]  Physical Activity: Inactive (03/03/2021)   Received from [facility]  Social Connections: Unknown (11/13/2021)   Received from [facility], [facility]  Stress: Stress Concern Present (03/03/2021)   Received from [facility], [facility]  Tobacco Use: Low Risk  (08/10/2023)     Readmission Risk Interventions     No data to display

## 2023-08-12 NOTE — Plan of Care (Signed)
  Problem: Education: Goal: Knowledge of disease or condition will improve 08/12/2023 1532 by Renee Aureliano DEL, RN Outcome: Progressing 08/12/2023 1245 by Renee Aureliano DEL, RN Outcome: Progressing Goal: Knowledge of secondary prevention will improve (MUST DOCUMENT ALL) 08/12/2023 1532 by Renee Aureliano DEL, RN Outcome: Progressing 08/12/2023 1245 by Renee Aureliano DEL, RN Outcome: Progressing Goal: Knowledge of patient specific risk factors will improve (DELETE if not current risk factor) 08/12/2023 1532 by Renee Aureliano DEL, RN Outcome: Progressing 08/12/2023 1245 by Renee Aureliano DEL, RN Outcome: Progressing   Problem: Ischemic Stroke/TIA Tissue Perfusion: Goal: Complications of ischemic stroke/TIA will be minimized 08/12/2023 1532 by Renee Aureliano DEL, RN Outcome: Progressing 08/12/2023 1245 by Renee Aureliano DEL, RN Outcome: Progressing   Problem: Coping: Goal: Will verbalize positive feelings about self 08/12/2023 1532 by Renee Aureliano DEL, RN Outcome: Progressing 08/12/2023 1245 by Renee Aureliano DEL, RN Outcome: Progressing Goal: Will identify appropriate support needs 08/12/2023 1532 by Renee Aureliano DEL, RN Outcome: Progressing 08/12/2023 1245 by Renee Aureliano DEL, RN Outcome: Progressing   Problem: Health Behavior/Discharge Planning: Goal: Ability to manage health-related needs will improve 08/12/2023 1532 by Renee Aureliano DEL, RN Outcome: Progressing 08/12/2023 1245 by Renee Aureliano DEL, RN Outcome: Progressing Goal: Goals will be collaboratively established with patient/family 08/12/2023 1532 by Renee Aureliano DEL, RN Outcome: Progressing 08/12/2023 1245 by Renee Aureliano DEL, RN Outcome: Progressing   Problem: Self-Care: Goal: Ability to participate in self-care as condition permits will improve 08/12/2023 1532 by Renee Aureliano DEL, RN Outcome: Progressing 08/12/2023 1245 by Renee Aureliano DEL, RN Outcome: Progressing Goal: Verbalization of feelings and concerns over difficulty with self-care will improve 08/12/2023 1532 by  Renee Aureliano DEL, RN Outcome: Progressing 08/12/2023 1245 by Renee Aureliano DEL, RN Outcome: Progressing Goal: Ability to communicate needs accurately will improve 08/12/2023 1532 by Renee Aureliano DEL, RN Outcome: Progressing 08/12/2023 1245 by Renee Aureliano DEL, RN Outcome: Progressing   Problem: Nutrition: Goal: Risk of aspiration will decrease 08/12/2023 1532 by Renee Aureliano DEL, RN Outcome: Progressing 08/12/2023 1245 by Renee Aureliano DEL, RN Outcome: Progressing Goal: Dietary intake will improve 08/12/2023 1532 by Renee Aureliano DEL, RN Outcome: Progressing 08/12/2023 1245 by Renee Aureliano DEL, RN Outcome: Progressing  Pt D/C to home post Loop procedure. Home meds given with experimental meds. BP 126/74 (BP Location: Right Wrist)   Pulse 85   Temp 98.1 F (36.7 C) (Oral)   Resp 18   Ht 4' 11 (1.499 m)   Wt 122.7 kg   LMP 11/17/2022 (Approximate)   SpO2 99%   BMI 54.64 kg/m  AVS reviewed, follow up questions answered. No other needs voiced at this time. IV removed. [name] 08/12/23 3:33 PM

## 2023-08-12 NOTE — Discharge Instructions (Addendum)
 Care After Your Loop Recorder  You have a Med[employer]onic Loop Recorder   Monitor your cardiac device site for redness, swelling, and drainage. Call the device clinic at [phone] if you experience these symptoms or fever/chills.  If you notice bleeding from your site, hold firm, but gently pressure with two fingers for 5 minutes. Dried blood on the steri-strips when removing the outer bandage is normal.   Keep the large square bandage on your site for 24 hours and then you may remove it yourself. Keep the steri-strips underneath in place.   You may shower after 72 hours / 3 days from your procedure with the steri-strips in place. They will usually fall off on their own, or may be removed after 10 days. Pat dry.   Avoid lotions, ointments, or perfumes over your incision until it is well-healed.  Please do not submerge in water  until your site is completely healed.   Your device is MRI compatible.   Remote monitoring is used to monitor your cardiac device from home. This monitoring is scheduled every month by our office. It allows us  to keep an eye on the function of your device to ensure it is working properly.   Follow up with [facility]-

## 2023-08-12 NOTE — Progress Notes (Signed)
 [facility] New Study Start: OCEANIC-STROKE   SUMMARY For more information refer to: [email or web address] Study Identifier: [id number]    A Multicenter, International, Randomized, Placebo Controlled, Double-blind, Parallel Group and Event Driven Phase 3 Study of the Oral FXIa Inhibitor Asundexian ([id number]) for the Prevention of Ischemic Stroke in Female and Female Participants Aged 51 Years and Older After an Acute Non-cardioembolic Ischemic Stroke or High-risk TIA  Brief Summary This study will randomize adult patients with an initial diagnosis of acute non-cardioembolic ischemic stroke or high-risk transient ischemic attack (TIA) to treatment within 72 hours of symptom onset and with the intention to be treated with antiplatelet therapy.  Design Phase 3, Randomized, Interventional, Event-Driven  Intervention Asundexian ([id number]) 50 mg tablets or matching placebo tablets (pink, oval, film-coated tablets  Concomitant Therapy Participants are to receive antiplatelet therapy (single or dual; provider discretion) during the study conduct.  Prohibited Therapy Oral anticoagulation Full dose and/or long-term anticoagulation therapy with heparin  or LMWH  Chronic (more than 4 weeks continuous) therapy with NSAIDs during the study conduct Concomitant use of combined P-gp and strong/moderate CYP3A4 inducers Concomitant use of combined P-gp and strong CYP3A4 inhibitors Herbal/traditional medications and/or supplements with known anticoagulant/antiplatelet effects  Anticoagulation Prophylaxis Venous thromboembolism prophylaxis with LMWH or unfractionated heparin  for short periods of time (2 weeks) is allowed.  Potential Drug-Drug Interactions Rosuvastatin 20 mg daily or higher (additional monitoring may be warranted due to increased concentrations of rosuvastatin) - asundexian ([id number]) is a weak inhibitor of CYP2C8)  Administration  Can be taken irrespective of food  intake. Should be swallowed whole with water , preferably in the morning. CANNOT be crushed or broken.      Plan: Start [asundexian [phone]) 50 mg tablets or placebo] today. Study medication must be picked up from pharmacy, medication can not be tubed.    Please contact IDS if any questions or concerns regarding the study medication.    Ruffin Lada, PharmD, BCPS Investigational Drug Service Pharmacist  [phone]

## 2023-08-12 NOTE — Research (Signed)
 PATIENT: Renee Hahn DOB: [birth year]  Renee Hahn is a 53 y.o. female who meets inclusion and exclusion criteria and may be a potential candidate for OCEANIC study.  A brief description of the study's purpose and duration was provided to see if the patient was interested in participating. The patient was told that study participation is voluntary, and if she choose not to participate, she will continue receiving the same treatment as any other patient.   Since the patient seemed interested in the trial, a copy of the informed consent was provided to her at 2pm yesterday and I said I would come back later to discuss the study in more detail after she had time to read the informed consent.  Detailed discussion  I returned at 9am today to discuss the study in detail.  I requested she ask any questions she may have as we reviewed the informed consent so I could answer them as we went along. I discussed the standard-of-care treatments, appropriate alternative treatments, procedures or devices that might be helpful to the patients, the clinical trial, and each option's relative risks and benefits and alternatives. We discussed the research participant's bill of rights. Then, we also reviewed the ICF page by page and discussed their responsibilities if they choose to participate, any compensation, and what medical treatment is available if injury occurs. Renee Hahn was encouraged to discuss study participation with family, significant others, and primary care attending or physician to help reduce the possibility of undue influence by talking to the investigator alone.  The patient was reminded that the study participation is voluntary, and if she choose not to participate, she will continue receiving the same care as any other patient. Additionally, if she choose to participate and later decide she want to withdraw, she will continue to be treated as any other patient. Renee Hahn was  told that she did not have to decide now. She then called her brother who had several questions and I answered all of them. Then she asked to take more time to think about the trial.   I came back again around 11:30 and she stated that she would like to proceed with consent. Renee Hahn answered questions appropriately to verbalize understanding of the informed consent.   The subject agreed to participate in the [facility] trial and signed the Research Participants Bill of Rights and the informed consent at 11:48 am today.  The informed consent was obtained prior to performance of any protocol-specific procedures for the subject.  A copy of the Research Participants Zell of Rights and signed informed consent was given to the subject, and a copy placed in the subject's medical record.   Ary Cummins, MD PhD Stroke Neurology 08/12/2023 10:11 PM

## 2023-08-12 NOTE — Plan of Care (Signed)

## 2023-08-12 NOTE — Evaluation (Signed)
 Speech Language Pathology Evaluation Patient Details Name: CATIA TODOROV MRN: [id number] DOB: 05/19/1971 Today's Date: 08/12/2023 Time: 8986-8966 SLP Time Calculation (min) (ACUTE ONLY): 20 min  Problem List:  Patient Active Problem List   Diagnosis Date Noted   Acute ischemic stroke (HCC) 08/11/2023   Aphasia 08/10/2023   Class 3 obesity 08/10/2023   Mild protein malnutrition (HCC) 08/10/2023   Hypertension 08/10/2023   Sepsis (HCC) 07/06/2023   Abscess, gluteal, left 07/05/2023   Cellulitis and abscess of buttock 07/04/2023   Mixed hyperlipidemia 06/16/2023   Complex atypical endometrial hyperplasia 01/11/2023   Elevated BP without diagnosis of hypertension 08/16/2022   Prediabetes 08/16/2022   History of cerebrovascular accident (CVA) with residual deficit 03/02/2021   Anemia 02/20/2021   Morbid obesity (HCC) 01/24/2020   Iron  deficiency anemia due to chronic blood loss 12/17/2019   Seborrhea 12/17/2019   S/P laparoscopic cholecystectomy 09/21/2019   Past Medical History:  Past Medical History:  Diagnosis Date   BMI 50.0-59.9, adult (HCC) 01/28/2020   Cholelithiasis    Hay fever    Headache    past   Hyperlipidemia    Iron  deficiency anemia    Menorrhagia 12/18/2019   Prediabetes    Stroke (HCC)    Vitamin B12 deficiency 02/27/2021   Past Surgical History:  Past Surgical History:  Procedure Laterality Date   BREAST BIOPSY     BREAST EXCISIONAL BIOPSY Left 12/03/2021   benign   BREAST LUMPECTOMY WITH RADIOACTIVE SEED LOCALIZATION Left 12/03/2021   Procedure: LEFT BREAST LUMPECTOMY WITH RADIOACTIVE SEED LOCALIZATION;  Surgeon: Vanderbilt Ned, MD;  Location: [facility] OR;  Service: General;  Laterality: Left;   CHOLECYSTECTOMY N/A 09/21/2019   Procedure: LAPAROSCOPIC CHOLECYSTECTOMY WITH INTRAOPERATIVE CHOLANGIOGRAM;  Surgeon: Vanderbilt Ned, MD;  Location: [facility] ORS;  Service: General;  Laterality: N/A;   DILITATION & CURRETTAGE/HYSTROSCOPY WITH HYDROTHERMAL ABLATION  N/A 11/17/2022   Procedure: DILATATION & CURETTAGE/HYSTEROSCOPY WITH HYDROTHERMAL ABLATION;  Surgeon: Rosalva Sawyer, MD;  Location: [facility] OR;  Service: Gynecology;  Laterality: N/A;   IRRIGATION AND DEBRIDEMENT BUTTOCKS Left 07/05/2023   Procedure: IRRIGATION AND DEBRIDEMENT LEFT BUTTOCKS;  Surgeon: Ned Hila, MD;  Location: [facility] ORS;  Service: General;  Laterality: Left;   ROBOTIC ASSISTED TOTAL HYSTERECTOMY WITH BILATERAL SALPINGO OOPHERECTOMY N/A 01/11/2023   Procedure: XI ROBOTIC ASSISTED TOTAL HYSTERECTOMY >250g WITH BILATERAL SALPINGO OOPHORECTOMY, MINI LAPAROTOMY;  Surgeon: Eldonna Mays, MD;  Location: [facility] ORS;  Service: Gynecology;  Laterality: N/A;   SENTINEL NODE BIOPSY N/A 01/11/2023   Procedure: SENTINEL NODE BIOPSY;  Surgeon: Eldonna Mays, MD;  Location: [facility] ORS;  Service: Gynecology;  Laterality: N/A;   THROMBECTOMY  2022   HPI:  Pt is a 53 y/o F admitted on 08/10/23 after presenting with transient difficulty reading/speaking. MRI showed acute infarct in the left MCA territory involving the left  parietal & temporal lobes. PMH: embolic L MCA stroke (August 7977), obesity, anemia, HLD, prediabetes   Assessment / Plan / Recommendation Clinical Impression  Pt lives with her brother and works for [employer]. She reports that her language is mostly back to baseline and if she feels stressed she may have difficulty finding the word but compensates by using a different word. Throughout assessment she did not exhibit any hesitations or dysnomia when expressing herself in conversation. She has been texting without difficulty and showed therapist several messages she sent without errors. She read a paragraph from handout from neurologist re: a medication and was able to comprehend and answer questions with 100% accuracy. She stated  sometimes it may take me a little longer to read occasionaly but I can understand what I have read. Oromotor ability and speech intelligibility are within normal range. Pt  scored a 26/30 on the SLUMS just one point below what is considered within average range. She feels that she will be able to manage her finances and medication without difficulty. Pt in agreement that she does not need further ST services however educated if she experiences any difficulty at home with her activities to notify her MD who can refer for outpatient. SLP did recommend that family/brother supervise her with her finances and medication management the first time she performs these tasks to double check for accuracy.    SLP Assessment  SLP Recommendation/Assessment: Patient does not need any further Speech Lanaguage Pathology Services (see impression statement) SLP Visit Diagnosis: Cognitive communication deficit (R41.841)    Recommendations for follow up therapy are one component of a multi-disciplinary discharge planning process, led by the attending physician.  Recommendations may be updated based on patient status, additional functional criteria and insurance authorization.    Follow Up Recommendations   (see impression statement)    Assistance Recommended at Discharge     Functional Status Assessment Patient has not had a recent decline in their functional status  Frequency and Duration           SLP Evaluation Cognition  Overall Cognitive Status: Within Functional Limits for tasks assessed Arousal/Alertness: Awake/alert Orientation Level: Oriented X4 Year: 2025 Day of Week: Correct Attention: Sustained Sustained Attention: Appears intact Memory: Appears intact (5/5 words indepedently) Awareness: Appears intact Problem Solving: Appears intact Safety/Judgment: Appears intact       Comprehension  Auditory Comprehension Overall Auditory Comprehension: Appears within functional limits for tasks assessed Visual Recognition/Discrimination Discrimination: Not tested Reading Comprehension Reading Status: Within funtional limits    Expression Expression Primary Mode of  Expression: Verbal Verbal Expression Overall Verbal Expression: Appears within functional limits for tasks assessed Initiation: No impairment Level of Generative/Spontaneous Verbalization: Conversation Repetition:  (NT) Naming: No impairment Pragmatics: No impairment Written Expression Dominant Hand: Right Written Expression: Not tested   Oral / Motor  Oral Motor/Sensory Function Overall Oral Motor/Sensory Function: Within functional limits Motor Speech Overall Motor Speech: Appears within functional limits for tasks assessed Respiration: Within functional limits Phonation: Normal Resonance: Within functional limits Articulation: Within functional limitis Intelligibility: Intelligible Motor Planning: Witnin functional limits Motor Speech Errors: Not applicable            Dustin Olam Bull 08/12/2023, 11:30 AM

## 2023-08-12 NOTE — Progress Notes (Addendum)
 STROKE TEAM PROGRESS NOTE   INTERIM HISTORY/SUBJECTIVE Patient sister is at the bedside.  She is neuro stable and no acute change overnight. She has read the [facility] trial info and asked appropriate questions.  I then talked to her brother and also answered his question.  Patient family decided to proceed with a trial.  OBJECTIVE  CBC    Component Value Date/Time   WBC 7.5 08/11/2023 0606   RBC 4.83 08/11/2023 0606   HGB 11.4 (L) 08/11/2023 0606   HGB 12.1 04/30/2020 1010   HCT 36.6 08/11/2023 0606   PLT 251 08/11/2023 0606   PLT 255 04/30/2020 1010   MCV 75.8 (L) 08/11/2023 0606   MCH 23.6 (L) 08/11/2023 0606   MCHC 31.1 08/11/2023 0606   RDW 16.0 (H) 08/11/2023 0606   LYMPHSABS 1.5 08/10/2023 0854   MONOABS 0.5 08/10/2023 0854   EOSABS 0.1 08/10/2023 0854   BASOSABS 0.0 08/10/2023 0854    BMET    Component Value Date/Time   NA 141 08/11/2023 0606   K 3.7 08/11/2023 0606   CL 106 08/11/2023 0606   CO2 25 08/11/2023 0606   GLUCOSE 101 (H) 08/11/2023 0606   BUN 9 08/11/2023 0606   CREATININE 0.97 08/11/2023 0606   CREATININE 0.82 12/18/2019 1431   CALCIUM  9.0 08/11/2023 0606   GFRNONAA >60 08/11/2023 0606   GFRNONAA >60 12/18/2019 1431    IMAGING past 24 hours No results found.   Vitals:   08/12/23 0338 08/12/23 0936 08/12/23 1128 08/12/23 1513  BP: 129/78 126/81 125/75 126/74  Pulse: 81 72 83 85  Resp: 17 18 18 18   Temp: 98.1 F (36.7 C) 98.2 F (36.8 C) 98.1 F (36.7 C) 98.1 F (36.7 C)  TempSrc: Oral Oral Oral Oral  SpO2: 99% 99% 99% 99%  Weight:      Height:         PHYSICAL EXAM General:  Alert, well-nourished, well-developed patient in no acute distress Psych:  Mood and affect appropriate for situation CV: Regular rate and rhythm on monitor Respiratory:  Regular, unlabored respirations on room air GI: Abdomen soft and nontender   NEURO:  Mental Status: AA&Ox3, patient is able to give clear and coherent history Speech/Language: speech is  without dysarthria or aphasia.  Naming, repetition, fluency, and comprehension intact.  Able to read a paragraph aloud without difficulty  Cranial Nerves:  II: PERRL. Visual fields full.  III, IV, VI: EOMI. Eyelids elevate symmetrically.  V: Sensation is intact to light touch and symmetrical to face.  VII: Face is symmetrical resting and smiling VIII: hearing intact to voice. IX, X: Phonation is normal.  KP:Dynloizm shrug 5/5. XII: tongue is midline without fasciculations. Motor: 5/5 strength to all muscle groups tested.  Tone: is normal and bulk is normal Sensation- Intact to light touch bilaterally.  Coordination: FTN intact bilaterally Gait- deferred  Most Recent NIH 0   ASSESSMENT/PLAN  [name] is a 53 y.o. female with history of left MCA stroke and hyperlipidemia admitted for sudden onset difficulty reading.  Patient states that the symptoms have resolved and that she has returned to baseline.  MRI demonstrates acute left MCA territory infarct. NIH on Admission 1  Stroke:  left MCA territory infarct, embolic pattern, etiology: Cryptogenic Code Stroke CT head loss of gray-white differentiation within left parietal lobe suspicious for acute infarct ASPECTS 9 CTA head & neck nondiagnostic MRI acute infarct in left MCA territory involving left parietal and temporal lobes, chronic infarct in left  frontal opercular region MRA no LVO or hemodynamically significant stenosis Carotid Doppler unremarkable 2D Echo EF 50 to 55%, severely dilated right atrium, normal left atrium, no atrial level shunt TCD bubble study no PFO LE venous Doppler negative for DVT Loop recorder placed prior to discharge Patient proceeded with OCEANIC stroke trial LDL 87 HgbA1c 5.5 UDS negative Hypercoagulable workup so far negative, rest pending VTE prophylaxis -Lovenox  aspirin  81 mg daily prior to admission, now on aspirin  81 mg daily and clopidogrel  75 mg daily for 3 weeks and then Plavix   alone.  Continue OCEANIC trial investigational medication at home Therapy recommendations: None Disposition: home  Hx of Stroke/TIA 02/2021 presented with aphasia due to left M2 occlusion.  MRI showed left frontal infarct.  Transferred to [facility], status post IR with TICI2b.  Symptom resolved on discharge.  LDL 90, A1c 5.5, EF 55 to 39%.  TEE negative, no PFO.  30-day cardiac monitor was reportedly negative.  Discharged on aspirin  and Lipitor 40  Hypertension Home meds: None Stable Long-term BP goal normotensive  Hyperlipidemia Home meds: Atorvastatin  80 mg daily, resumed in hospital LDL 87, goal < 70 Add ezetimibe  10 mg daily Continue statin and Zetia  at discharge  Other Stroke Risk Factors Obesity, Body mass index is 54.64 kg/m., BMI >/= 30 associated with increased stroke risk, recommend weight loss, diet and exercise as appropriate   Other Active Problems Recent loss of several family members, requested grief support, will refer to psychiatry as outpatient.  Hospital day # 2  Neurology will sign off. Please call with questions. Pt will follow up with stroke clinic NP at [facility] in about 4 weeks. Thanks for the consult.   Renee Cummins, MD PhD Stroke Neurology 08/12/2023 10:11 PM       To contact Stroke Continuity provider, please refer to [email or web address]. After hours, contact General Neurology

## 2023-08-13 LAB — LUPUS ANTICOAGULANT PANEL
DRVVT: 42.4 s (ref 0.0–47.0)
PTT Lupus Anticoagulant: 32.3 s (ref 0.0–43.5)

## 2023-08-13 NOTE — Discharge Summary (Signed)
 Physician Discharge Summary   Patient: Renee Hahn MRN: [id number] DOB: May 25, 1971  Admit date:     08/10/2023  Discharge date: 08/12/2023  Discharge Physician: Elgie Butter   PCP: Katheen Roselie Rockford, NP   Recommendations at discharge:  Please follow up with PCP in one week.  Please follow up neurology as recommended.   Discharge Diagnoses: Principal Problem:   Aphasia Active Problems:   Prediabetes   Mixed hyperlipidemia   Class 3 obesity   Mild protein malnutrition (HCC)   Hypertension   Acute ischemic stroke [facility]    Hospital Course: Renee Hahn is a 53 y.o. female with medical history significant of class III obesity, cholelithiasis, high fever, headache, hyperlipidemia, iron  deficiency anemia, menorrhagia, prediabetes, history of nonhemorrhagic CVA, vitamin B12 deficiency who presented to the emergency department with aphasia.  CT head without contrast showed loss of gray-white differentiation within the left parietal lobe (within the posterior left MCA vascular territory) suspicious for acute infarct.  Chronic cortical/subcortical left MCA territory infarct within the left frontal operculum and insula.  CTA head and neck could not be adequately assess due to poor quality arterial contrast bolus.  MRI brain and MRA head show an acute infarct in the left MCA territory involving the left parietal and temporal lobes.     Assessment and Plan:     Acute infarct in the left MCA territory involving the left parietal and temporal lobes - reviewed CT, MRI and MRA of the head.  - US  carotids duplex reviewed.  Echocardiogram showed LVEF of 50 to 55%.  Venous duplex of the lower extremities No evidence of deep vein thrombosis seen in the lower extremities,  bilaterally.  LDL is 87, A1c is 5.5%, UDS Is negative.  Neurology on board.  TCD and TEE to be scheduled.  Continue with aspirin  and plavix  for 3 weeks followed by plavix  alone along with zetia  and  statin.  She is  enrolled in OCEANIC trial investigational medication.  She also got loop recorder before discharge.        Body mass index is 54.64 kg/m. Morbid Obesity.        Mild iron  deficiency anemia Hemoglobin around 11.        Estimated body mass index is 54.64 kg/m as calculated from the following:   Height as of this encounter: 4' 11 (1.499 m).   Weight as of this encounter: 122.7 kg.     Consultants: neurology, EP Procedures performed: loop recorder.   Disposition: Home Diet recommendation:  Cardiac and Carb modified diet DISCHARGE MEDICATION: Allergies as of 08/12/2023       Reactions   Iodinated Contrast Media Itching        Medication List     STOP taking these medications    ferrous gluconate  324 MG tablet Commonly known as: FERGON   ibuprofen  200 MG tablet Commonly known as: ADVIL        TAKE these medications    acetaminophen  650 MG CR tablet Commonly known as: TYLENOL  Take 650-1,300 mg by mouth every 8 (eight) hours as needed for pain.   aspirin  EC 81 MG tablet Take 1 tablet (81 mg total) by mouth daily for 21 days. Swallow whole.   atorvastatin  80 MG tablet Commonly known as: LIPITOR Take 1 tablet (80 mg total) by mouth daily.   clopidogrel  75 MG tablet Commonly known as: PLAVIX  Take 1 tablet (75 mg total) by mouth daily.   ezetimibe  10 MG tablet Commonly known as: ZETIA  Take  1 tablet (10 mg total) by mouth daily.   FLUORIDEX DT Place 1 Application onto teeth 2 (two) times daily.   OCEANIC-STROKE asundexian or placebo 50 mg tablet Take 1 tablet (50 mg total) by mouth daily. For Investigational Use Only   polyethylene glycol 17 g packet Commonly known as: MiraLax  Take 17 g by mouth daily as needed for mild constipation.   senna-docusate 8.6-50 MG tablet Commonly known as: Senokot-S Take 1 tablet by mouth 2 (two) times daily. What changed:  when to take this reasons to take this   trolamine salicylate 10 % cream Commonly known  as: ASPERCREME Apply 1 Application topically as needed for muscle pain. *as needed for legs*        Follow-up Information     Nche, Roselie Rockford, NP. Schedule an appointment as soon as possible for a visit in 1 week(s).   Specialty: Internal Medicine Contact information: [address] [place] [address] [phone]         [facility]. Schedule an appointment as soon as possible for a visit in 1 month(s).   Specialty: Neurology Why: stroke clinic Contact information: [address] [place]  [address] [phone]               Discharge Exam: Fredricka Weights   08/10/23 0809  Weight: 122.7 kg   General exam: Appears calm and comfortable  Respiratory system: Clear to auscultation. Respiratory effort normal. Cardiovascular system: S1 & S2 heard, RRR. No JVD Gastrointestinal system: Abdomen is nondistended, soft and nontender.  Central nervous system: Alert and oriented.  Extremities: Symmetric 5 x 5 power. Skin: No rashes,  Psychiatry: Mood & affect appropriate.    Condition at discharge: fair  The results of significant diagnostics from this hospitalization (including imaging, microbiology, ancillary and laboratory) are listed below for reference.   Imaging Studies: VAS US  CAROTID (at [facility] and [facility] only) Result Date: 08/11/2023 Carotid Arterial Duplex Study Patient Name:  Renee Hahn  Date of Exam:   08/10/2023 Medical Rec #: [id number]           Accession #:    [id number] Date of Birth: 1971/03/20           Patient Gender: F Patient Age:   29 years Exam Location:  [facility] Procedure:      VAS US  CAROTID Referring Phys: LOLA JERNIGAN --------------------------------------------------------------------------------  Indications:       CVA. Risk Factors:      Hyperlipidemia, no history of smoking, prior CVA. Comparison Study:  No previous carotid duplex exams. CTA on 02/20/2021 showed                     carotid arteries WNL Performing Technologist: Ezzie Potters RVT, RDMS  Examination Guidelines: A complete evaluation includes B-mode imaging, spectral Doppler, color Doppler, and power Doppler as needed of all accessible portions of each vessel. Bilateral testing is considered an integral part of a complete examination. Limited examinations for reoccurring indications may be performed as noted.  Right Carotid Findings: +----------+--------+--------+--------+------------------+------------------+           PSV cm/sEDV cm/sStenosisPlaque DescriptionComments           +----------+--------+--------+--------+------------------+------------------+ CCA Prox  47      12                                                   +----------+--------+--------+--------+------------------+------------------+  CCA Distal51      17                                intimal thickening +----------+--------+--------+--------+------------------+------------------+ ICA Prox  46      16                                                   +----------+--------+--------+--------+------------------+------------------+ ICA Distal53      18                                tortuous           +----------+--------+--------+--------+------------------+------------------+ ECA       61      10                                                   +----------+--------+--------+--------+------------------+------------------+ +----------+--------+-------+----------------+-------------------+           PSV cm/sEDV cmsDescribe        Arm Pressure (mmHG) +----------+--------+-------+----------------+-------------------+ Subclavian120            Multiphasic, WNL                    +----------+--------+-------+----------------+-------------------+ +---------+--------+--+--------+--+---------+ VertebralPSV cm/s35EDV cm/s13Antegrade +---------+--------+--+--------+--+---------+  Left Carotid Findings:  +----------+--------+--------+--------+------------------+--------+           PSV cm/sEDV cm/sStenosisPlaque DescriptionComments +----------+--------+--------+--------+------------------+--------+ CCA Prox  91      18                                         +----------+--------+--------+--------+------------------+--------+ CCA Distal67      17                                         +----------+--------+--------+--------+------------------+--------+ ICA Prox  90      33                                tortuous +----------+--------+--------+--------+------------------+--------+ ICA Distal77      27                                tortuous +----------+--------+--------+--------+------------------+--------+ ECA       74      13                                         +----------+--------+--------+--------+------------------+--------+ +----------+--------+--------+----------------+-------------------+           PSV cm/sEDV cm/sDescribe        Arm Pressure (mmHG) +----------+--------+--------+----------------+-------------------+ [id number]              Multiphasic, WNL                    +----------+--------+--------+----------------+-------------------+ +---------+--------+--+--------+--+---------+  VertebralPSV cm/s63EDV cm/s21Antegrade +---------+--------+--+--------+--+---------+ Hypoechoic plaque seen in carotid bulb  Summary: Right Carotid: The extracranial vessels were near-normal with only minimal wall                thickening or plaque. Left Carotid: The extracranial vessels were near-normal with only minimal wall               thickening or plaque. Vertebrals:  Bilateral vertebral arteries demonstrate antegrade flow. Subclavians: Normal flow hemodynamics were seen in bilateral subclavian              arteries. *See table(s) above for measurements and observations.  Electronically signed by Ary Cummins MD on 08/11/2023 at 8:14:01 PM.    Final    VAS  US  TRANSCRANIAL DOPPLER W BUBBLES Result Date: 08/11/2023  Transcranial Doppler with Bubble Patient Name:  KEONDRIA SIEVER  Date of Exam:   08/11/2023 Medical Rec #: [id number]           Accession #:    [id number] Date of Birth: February 10, 1971           Patient Gender: F Patient Age:   13 years Exam Location:  [facility] Procedure:      VAS US  TRANSCRANIAL DOPPLER W BUBBLES Referring Phys: ARY XU --------------------------------------------------------------------------------  Indications: Stroke. Comparison Study: No prior studies. Performing Technologist: Cordella Collet RVT  Examination Guidelines: A complete evaluation includes B-mode imaging, spectral Doppler, color Doppler, and power Doppler as needed of all accessible portions of each vessel. Bilateral testing is considered an integral part of a complete examination. Limited examinations for reoccurring indications may be performed as noted.  Summary:  A vascular evaluation was performed. The left middle cerebral artery was studied. An IV was inserted into the patient's left Cephalic. Verbal informed consent was obtained.  No HITS detected at rest or with Valsalva. Negative for right to left shunt.  *See table(s) above for TCD measurements and observations.  Diagnosing physician: Ary Cummins MD Electronically signed by Ary Cummins MD on 08/11/2023 at 8:13:44 PM.    Final    ECHOCARDIOGRAM COMPLETE Result Date: 08/11/2023    ECHOCARDIOGRAM REPORT   Patient Name:   LENER VENTRESCA Date of Exam: 08/11/2023 Medical Rec #:  [id number]          Height:       59.0 in Accession #:    [id number]         Weight:       270.5 lb Date of Birth:  08/25/1970          BSA:          2.097 m Patient Age:    52 years           BP:           108/87 mmHg Patient Gender: F                  HR:           70 bpm. Exam Location:  Inpatient Procedure: 2D Echo, 3D Echo, Cardiac Doppler, Color Doppler, Strain Analysis and            Saline Contrast Bubble Study Indications:     Stroke  History:        Patient has no prior history of Echocardiogram examinations.                 Risk Factors:Hypertension and Dyslipidemia.  Sonographer:    Ozell Free Referring Phys: [id number] SRISHTI L  BHAGAT IMPRESSIONS  1. Left ventricular ejection fraction, by estimation, is 50 to 55%. The left ventricle has low normal function. The left ventricle has no regional wall motion abnormalities. There is mild concentric left ventricular hypertrophy. Left ventricular diastolic parameters are indeterminate. The average left ventricular global longitudinal strain is -19.0 %. The global longitudinal strain is normal.  2. Right ventricular systolic function is normal. The right ventricular size is mildly enlarged. There is normal pulmonary artery systolic pressure.  3. Right atrial size was severely dilated.  4. The mitral valve is normal in structure. No evidence of mitral valve regurgitation. No evidence of mitral stenosis.  5. The aortic valve is normal in structure. Aortic valve regurgitation is not visualized. Aortic valve sclerosis/calcification is present, without any evidence of aortic stenosis.  6. The inferior vena cava is normal in size with greater than 50% respiratory variability, suggesting right atrial pressure of 3 mmHg. FINDINGS  Left Ventricle: Left ventricular ejection fraction, by estimation, is 50 to 55%. The left ventricle has low normal function. The left ventricle has no regional wall motion abnormalities. The average left ventricular global longitudinal strain is -19.0 %. The global longitudinal strain is normal. The left ventricular internal cavity size was normal in size. There is mild concentric left ventricular hypertrophy. Left ventricular diastolic parameters are indeterminate. Right Ventricle: The right ventricular size is mildly enlarged. No increase in right ventricular wall thickness. Right ventricular systolic function is normal. There is normal pulmonary artery systolic pressure.  The tricuspid regurgitant velocity is 2.35  m/s, and with an assumed right atrial pressure of 8 mmHg, the estimated right ventricular systolic pressure is 30.1 mmHg. Left Atrium: Left atrial size was normal in size. Right Atrium: Right atrial size was severely dilated. Pericardium: There is no evidence of pericardial effusion. Mitral Valve: The mitral valve is normal in structure. No evidence of mitral valve regurgitation. No evidence of mitral valve stenosis. Tricuspid Valve: The tricuspid valve is normal in structure. Tricuspid valve regurgitation is not demonstrated. No evidence of tricuspid stenosis. Aortic Valve: The aortic valve is normal in structure. Aortic valve regurgitation is not visualized. Aortic valve sclerosis/calcification is present, without any evidence of aortic stenosis. Aortic valve mean gradient measures 4.0 mmHg. Aortic valve peak  gradient measures 8.0 mmHg. Aortic valve area, by VTI measures 1.74 cm. Pulmonic Valve: The pulmonic valve was normal in structure. Pulmonic valve regurgitation is not visualized. No evidence of pulmonic stenosis. Aorta: The aortic root is normal in size and structure. Venous: The inferior vena cava is normal in size with greater than 50% respiratory variability, suggesting right atrial pressure of 3 mmHg. IAS/Shunts: No atrial level shunt detected by color flow Doppler. Agitated saline contrast was given intravenously to evaluate for intracardiac shunting.  LEFT VENTRICLE PLAX 2D LVIDd:         4.20 cm   Diastology LVIDs:         2.90 cm   LV e' medial:    7.72 cm/s LV PW:         1.10 cm   LV E/e' medial:  11.0 LV IVS:        1.20 cm   LV e' lateral:   9.36 cm/s LVOT diam:     1.90 cm   LV E/e' lateral: 9.1 LV SV:         55 LV SV Index:   26        2D Longitudinal Strain LVOT Area:     2.84 cm  2D Strain GLS Avg:     -19.0 %                           3D Volume EF:                          3D EF:        53 %                          LV EDV:       113 ml                           LV ESV:       54 ml                          LV SV:        59 ml RIGHT VENTRICLE            IVC RV Basal diam:  4.40 cm    IVC diam: 2.00 cm RV S prime:     9.46 cm/s TAPSE (M-mode): 2.7 cm LEFT ATRIUM             Index        RIGHT ATRIUM           Index LA diam:        3.20 cm 1.53 cm/m   RA Area:     22.00 cm LA Vol (A2C):   47.3 ml 22.56 ml/m  RA Volume:   83.00 ml  39.58 ml/m LA Vol (A4C):   47.8 ml 22.80 ml/m LA Biplane Vol: 49.3 ml 23.51 ml/m  AORTIC VALVE AV Area (Vmax):    1.75 cm AV Area (Vmean):   1.74 cm AV Area (VTI):     1.74 cm AV Vmax:           141.00 cm/s AV Vmean:          94.100 cm/s AV VTI:            0.315 m AV Peak Grad:      8.0 mmHg AV Mean Grad:      4.0 mmHg LVOT Vmax:         87.00 cm/s LVOT Vmean:        57.600 cm/s LVOT VTI:          0.193 m LVOT/AV VTI ratio: 0.61  AORTA Ao Root diam: 2.90 cm Ao Asc diam:  2.40 cm MITRAL VALVE               TRICUSPID VALVE MV Area (PHT): 3.51 cm    TR Peak grad:   22.1 mmHg MV Decel Time: 216 msec    TR Vmax:        235.00 cm/s MV E velocity: 84.90 cm/s MV A velocity: 88.00 cm/s  SHUNTS MV E/A ratio:  0.96        Systemic VTI:  0.19 m                            Systemic Diam: 1.90 cm Kardie Tobb DO Electronically signed by Dub Huntsman DO Signature Date/Time: 08/11/2023/12:22:55 PM    Final    VAS US  LOWER EXTREMITY VENOUS (DVT) Result Date: 08/10/2023  Lower  Venous DVT Study Patient Name:  Levana E Miera  Date of Exam:   08/10/2023 Medical Rec #: [id number]           Accession #:    [id number] Date of Birth: Dec 11, 1970           Patient Gender: F Patient Age:   54 years Exam Location:  [facility] Procedure:      VAS US  LOWER EXTREMITY VENOUS (DVT) Referring Phys: LOLA JERNIGAN --------------------------------------------------------------------------------  Indications: Stroke.  Limitations: Poor ultrasound/tissue interface. Comparison Study: No previous exams Performing Technologist: Jody Hill RVT, RDMS   Examination Guidelines: A complete evaluation includes B-mode imaging, spectral Doppler, color Doppler, and power Doppler as needed of all accessible portions of each vessel. Bilateral testing is considered an integral part of a complete examination. Limited examinations for reoccurring indications may be performed as noted. The reflux portion of the exam is performed with the patient in reverse Trendelenburg.  +---------+---------------+---------+-----------+----------+-------------------+ RIGHT    CompressibilityPhasicitySpontaneityPropertiesThrombus Aging      +---------+---------------+---------+-----------+----------+-------------------+ CFV      Full           Yes      Yes                                      +---------+---------------+---------+-----------+----------+-------------------+ SFJ      Full                                                             +---------+---------------+---------+-----------+----------+-------------------+ FV Prox  Full           Yes      Yes                                      +---------+---------------+---------+-----------+----------+-------------------+ FV Mid   Full           Yes      Yes                                      +---------+---------------+---------+-----------+----------+-------------------+ FV DistalFull           Yes      Yes                                      +---------+---------------+---------+-----------+----------+-------------------+ PFV      Full                                                             +---------+---------------+---------+-----------+----------+-------------------+ POP      Full           Yes      Yes                                      +---------+---------------+---------+-----------+----------+-------------------+  PTV      Full                                                              +---------+---------------+---------+-----------+----------+-------------------+ PERO     Full                                         Not well visualized +---------+---------------+---------+-----------+----------+-------------------+   +---------+---------------+---------+-----------+----------+-------------------+ LEFT     CompressibilityPhasicitySpontaneityPropertiesThrombus Aging      +---------+---------------+---------+-----------+----------+-------------------+ CFV      Full           Yes      Yes                                      +---------+---------------+---------+-----------+----------+-------------------+ SFJ      Full                                                             +---------+---------------+---------+-----------+----------+-------------------+ FV Prox  Full           Yes      Yes                                      +---------+---------------+---------+-----------+----------+-------------------+ FV Mid   Full           Yes      Yes                                      +---------+---------------+---------+-----------+----------+-------------------+ FV DistalFull           Yes      Yes                                      +---------+---------------+---------+-----------+----------+-------------------+ PFV      Full                                                             +---------+---------------+---------+-----------+----------+-------------------+ POP      Full           Yes      Yes                                      +---------+---------------+---------+-----------+----------+-------------------+ PTV      Full                                                             +---------+---------------+---------+-----------+----------+-------------------+  PERO     Full                                         Not well visualized +---------+---------------+---------+-----------+----------+-------------------+      Summary: BILATERAL: - No evidence of deep vein thrombosis seen in the lower extremities, bilaterally. -No evidence of popliteal cyst, bilaterally.   *See table(s) above for measurements and observations. Electronically signed by Gaile New MD on 08/10/2023 at 5:57:11 PM.    Final    MR BRAIN WO CONTRAST Result Date: 08/10/2023 CLINICAL DATA:  Seizure, new-onset, no history of trauma post-stroke epilepsy suspected vs. new stroke; Neuro deficit, acute, stroke suspected EXAM: MRI HEAD WITHOUT CONTRAST MRA HEAD WITHOUT CONTRAST TECHNIQUE: Multiplanar, multi-echo pulse sequences of the brain and surrounding structures were acquired without intravenous contrast. Angiographic images of the Circle of Willis were acquired using MRA technique without intravenous contrast. COMPARISON:  Same-day CT head and head and neck angiogram FINDINGS: MRI HEAD FINDINGS Brain: There is an acute infarcts in the left MCA territory involving the left parietal and temporal lobes. No hemorrhage. No hydrocephalus. No extra-axial fluid collection. No mass effect. There is chronic infarct in the left frontal opercular region. Vascular: See below Skull and upper cervical spine: Normal marrow signal. Sinuses/Orbits: No middle ear or mastoid effusion. Paranasal sinuses are clear. Orbits are unremarkable. Other: None MRA HEAD FINDINGS Anterior circulation: No aneurysm. No vascular malformation. No proximal occlusion. No significant stenosis Posterior circulation: No aneurysm. No vascular malformation. No proximal occlusion. No significant stenosis Anatomic variants: None IMPRESSION: 1. Acute infarct in the left MCA territory involving the left parietal and temporal lobes. No hemorrhage. 2. Chronic infarct in the left frontal opercular region. 3. No intracranial large vessel occlusion or significant stenosis. Findings were paged to Dr. Jerrie on 08/11/23 at 11:17 AM Electronically Signed   By: Lyndall Gore M.D.   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 new stroke; Neuro deficit, acute, stroke suspected EXAM: MRI HEAD WITHOUT CONTRAST MRA HEAD WITHOUT CONTRAST TECHNIQUE: Multiplanar, multi-echo pulse sequences of the brain and surrounding structures were acquired without intravenous contrast. Angiographic images of the Circle of Willis were acquired using MRA technique without intravenous contrast. COMPARISON:  Same-day CT head and head and neck angiogram FINDINGS: MRI HEAD FINDINGS Brain: There is an acute infarcts in the left MCA territory involving the left parietal and temporal lobes. No hemorrhage. No hydrocephalus. No extra-axial fluid collection. No mass effect. There is chronic infarct in the left frontal opercular region. Vascular: See below Skull and upper cervical spine: Normal marrow signal. Sinuses/Orbits: No middle ear or mastoid effusion. Paranasal sinuses are clear. Orbits are unremarkable. Other: None MRA HEAD FINDINGS Anterior circulation: No aneurysm. No vascular malformation. No proximal occlusion. No significant stenosis Posterior circulation: No aneurysm. No vascular malformation. No proximal occlusion. No significant stenosis Anatomic variants: None IMPRESSION: 1. Acute infarct in the left MCA territory involving the left parietal and temporal lobes. No hemorrhage. 2. Chronic infarct in the left frontal opercular region. 3. No intracranial large vessel occlusion or significant stenosis. Findings were paged to Dr. Jerrie on 08/11/23 at 11:17 AM Electronically Signed   By: Lyndall Gore M.D.   On: 08/10/2023 11:17   CT ANGIO HEAD NECK W WO CM Result Date: 08/10/2023 CLINICAL  DATA:  Provided history: Stroke/TIA, determine embolic source. EXAM: CT ANGIOGRAPHY HEAD AND NECK WITH AND WITHOUT CONTRAST TECHNIQUE: Multidetector CT imaging of the head and neck was performed using the standard protocol during bolus  administration of intravenous contrast. Multiplanar CT image reconstructions and MIPs were obtained to evaluate the vascular anatomy. Carotid stenosis measurements (when applicable) are obtained utilizing NASCET criteria, using the distal internal carotid diameter as the denominator. RADIATION DOSE REDUCTION: This exam was performed according to the departmental dose-optimization program which includes automated exposure control, adjustment of the mA and/or kV according to patient size and/or use of iterative reconstruction technique. CONTRAST:  75mL OMNIPAQUE  IOHEXOL  350 MG/ML SOLN COMPARISON:  Non-contrast head CT performed earlier today 08/10/2023. CT angiogram head/neck 02/20/2021. FINDINGS: CTA NECK FINDINGS Limited evaluation due to a poor quality arterial contrast bolus and due to motion degradation. Within these limitations, findings are as follows. Aortic arch: The visualized thoracic aorta is normal in caliber. Streak/beam hardening artifact arising from a dense left-sided contrast bolus partially obscures the left subclavian artery. No definite hemodynamically significant stenosis of the innominate or visible proximal subclavian arteries. Right carotid system: Cannot be assessed due to a poor quality arterial contrast bolus. Left carotid system: Cannot be assessed due to a poor quality arterial contrast bolus. Vertebral arteries: Cannot be assessed due to a poor quality arterial contrast bolus. Skeleton: Poor dentition. Elsewhere, no acute fracture or aggressive osseous lesion. Other neck: Unremarkable. Upper chest: No consolidation within the imaged lung apices. Review of the MIP images confirms the above findings CTA HEAD FINDINGS Anterior circulation: The anterior intracranial arterial circulation cannot be adequately assessed due to a poor quality arterial contrast bolus. Posterior circulation: The posterior intracranial arterial circulation cannot be adequately assessed due to a poor quality arterial  contrast bolus. Venous sinuses: Within the limitations of contrast timing, no convincing thrombus. Anatomic variants: Cannot be assessed as described above. Review of the MIP images confirms the above findings These results were called by telephone at the time of interpretation on 08/10/2023 at 9:40 am to provider [facility] , who verbally acknowledged these results. IMPRESSION: CTA neck: The common carotid, internal carotid and vertebral arteries cannot be adequately assessed due to a a poor quality arterial contrast bolus. CTA head: The intracranial anterior and posterior arterial circulation cannot be adequately assessed due to a poor quality arterial contrast bolus. Electronically Signed   By: Rockey Childs D.O.   On: 08/10/2023 10:24   CT HEAD CODE STROKE WO CONTRAST Result Date: 08/10/2023 CLINICAL DATA:  Code stroke. Provided history: Neuro deficit, acute, stroke suspected. EXAM: CT HEAD WITHOUT CONTRAST TECHNIQUE: Contiguous axial images were obtained from the base of the skull through the vertex without intravenous contrast. RADIATION DOSE REDUCTION: This exam was performed according to the departmental dose-optimization program which includes automated exposure control, adjustment of the mA and/or kV according to patient size and/or use of iterative reconstruction technique. COMPARISON:  Head CT 02/20/2021. FINDINGS: Brain: No age-advanced or lobar predominant parenchymal atrophy. Loss of gray-white differentiation within the left parietal lobe within the posterior left MCA vascular territory suspicious for an acute infarct (for instance as seen on series 2, image 17 Chronic cortical/subcortical infarct within the left frontal operculum and left insula (MCA vascular territory).). There is no acute intracranial hemorrhage. No extra-axial fluid collection. No evidence of an intracranial mass. No midline shift. Vascular: No hyperdense vessel.  Atherosclerotic calcifications. Skull: No calvarial fracture or  aggressive osseous lesion. Sinuses/Orbits: No mass or acute finding within  the imaged orbits. No significant paranasal sinus disease at the imaged levels. ASPECTS [facility] Stroke Program Early CT Score) - Ganglionic level infarction (caudate, lentiform nuclei, internal capsule, insula, M1-M3 cortex): 7 - Supraganglionic infarction (M4-M6 cortex): 2 Total score (0-10 with 10 being normal): 9 Attempts are being made to reach the ordering provider at this time regarding impression #1. IMPRESSION: 1. Loss of gray-white differentiation within the left parietal lobe (within the posterior left MCA vascular territory) suspicious for acute infarct. ASPECTS is 9. 2. Chronic cortical/subcortical left MCA territory infarct within the left frontal operculum and insula. Electronically Signed   By: Rockey Childs D.O.   On: 08/10/2023 09:41    Microbiology: Results for orders placed or performed during the hospital encounter of 07/04/23  Culture, blood (Routine X 2) w Reflex to ID Panel     Status: None   Collection Time: 07/04/23 11:39 PM   Specimen: BLOOD RIGHT WRIST  Result Value Ref Range Status   Specimen Description   Final    BLOOD RIGHT WRIST Performed at [facility], [address], [place], [place] [address]    Special Requests   Final    BOTTLES DRAWN AEROBIC AND ANAEROBIC Blood Culture results may not be optimal due to an excessive volume of blood received in culture bottles Performed at [facility], [address] [place], [place] [address]    Culture   Final    NO GROWTH 5 DAYS Performed at [facility], [address] [place], [place] [address]    Report Status 07/10/2023 FINAL  Final  Culture, blood (Routine X 2) w Reflex to ID Panel     Status: None   Collection Time: 07/04/23 11:44 PM   Specimen: BLOOD LEFT ARM  Result Value Ref Range Status   Specimen Description   Final    BLOOD LEFT ARM Performed at [facility], [address],  [place], [place] [address]    Special Requests   Final    BOTTLES DRAWN AEROBIC AND ANAEROBIC Blood Culture adequate volume Performed at [facility], [address] [place], [place] [address]    Culture   Final    NO GROWTH 5 DAYS Performed at [facility], [address], [place], [place] [address]    Report Status 07/10/2023 FINAL  Final    Labs: CBC: Recent Labs  Lab 08/10/23 0854 08/10/23 0858 08/11/23 0606  WBC 8.1  --  7.5  NEUTROABS 6.1  --   --   HGB 12.2 12.9 11.4*  HCT 40.0 38.0 36.6  MCV 77.8*  --  75.8*  PLT 273  --  251   Basic Metabolic Panel: Recent Labs  Lab 08/10/23 0854 08/10/23 0858 08/11/23 0606  NA 141 142 141  K 3.8 3.8 3.7  CL 106 105 106  CO2 27  --  25  GLUCOSE 113* 104* 101*  BUN 12 11 9   CREATININE 0.83 1.00 0.97  CALCIUM  8.9  --  9.0   Liver Function Tests: Recent Labs  Lab 08/10/23 0854 08/11/23 0606  AST 39 32  ALT 41 24  ALKPHOS 93 83  BILITOT 0.5 0.9  PROT 7.5 6.1*  ALBUMIN 3.4* 2.7*   CBG: Recent Labs  Lab 08/10/23 0852 08/11/23 1122  GLUCAP 88 105*    Discharge time spent: 42 minutes.   Signed: Malon Siddall, MD [facility]

## 2023-08-15 ENCOUNTER — Telehealth: Payer: Self-pay

## 2023-08-15 ENCOUNTER — Encounter (HOSPITAL_COMMUNITY): Payer: Self-pay | Admitting: Internal Medicine

## 2023-08-15 NOTE — Transitions of Care (Post Inpatient/ED Visit) (Signed)
 08/15/2023  Name: Renee Hahn MRN: [id number] DOB: 11/03/1970  Today's TOC FU Call Status: Today's TOC FU Call Status:: Successful TOC FU Call Completed TOC FU Call Complete Date: 08/15/23 Patient's Name and Date of Birth confirmed.  Transition Care Management Follow-up Telephone Call Date of Discharge: 08/12/23 Discharge Facility: [facility]) Type of Discharge: Inpatient Admission Primary Inpatient Discharge Diagnosis:: cerebral infarction How have you been since you were released from the hospital?: Better Any questions or concerns?: No  Items Reviewed: Did you receive and understand the discharge instructions provided?: Yes Medications obtained,verified, and reconciled?: Yes (Medications Reviewed) Any new allergies since your discharge?: No Dietary orders reviewed?: Yes Do you have support at home?: Yes People in Home: sibling(s)  Medications Reviewed Today: Medications Reviewed Today     Reviewed by Darrall Ellison, LPN (Licensed Practical Nurse) on 08/15/23 at 1123  Med List Status: <None>   Medication Order Taking? Sig Documenting Provider Last Dose Status Informant  acetaminophen  (TYLENOL ) 650 MG CR tablet [id number] No Take 650-1,300 mg by mouth every 8 (eight) hours as needed for pain. [provider] Past Week Active Self, Pharmacy Records  aspirin  EC 81 MG tablet [id number]  Take 1 tablet (81 mg total) by mouth daily for 21 days. Swallow whole. Akula, Vijaya, MD  Active   atorvastatin  (LIPITOR) 80 MG tablet [id number] No Take 1 tablet (80 mg total) by mouth daily. Nche, Connye Delaine, NP Past Week Active Self, Pharmacy Records  clopidogrel  (PLAVIX ) 75 MG tablet [id number]  Take 1 tablet (75 mg total) by mouth daily. Akula, Vijaya, MD  Active   ezetimibe  (ZETIA ) 10 MG tablet [id number]  Take 1 tablet (10 mg total) by mouth daily. Akula, Vijaya, MD  Active   polyethylene glycol (MIRALAX ) 17 g packet [id number] No Take 17 g by mouth daily as needed for  mild constipation. Lonita Roach, MD Taking Active Self, Pharmacy Records           Med Note Micah Ade   Wed Aug 10, 2023 12:27 PM) >30 days.  senna-docusate (SENOKOT-S) 8.6-50 MG tablet [id number] No Take 1 tablet by mouth 2 (two) times daily.  Patient taking differently: Take 1 tablet by mouth 2 (two) times daily as needed for moderate constipation.   Lonita Roach, MD Past Week Active Self, Pharmacy Records  Sodium Fluoride [facility] DT) [id number] No Place 1 Application onto teeth 2 (two) times daily. [provider] 08/09/2023 Active Self, Pharmacy Records  Study - OCEANIC-STROKE - asundexian 50 mg or placebo tablet (PI-Sethi) [id number]  Take 1 tablet (50 mg total) by mouth daily. For Investigational Use Only Akula, Vijaya, MD  Active   trolamine salicylate (ASPERCREME) 10 % cream [id number] No Apply 1 Application topically as needed for muscle pain. *as needed for legs* [provider] Past Month Active Self, Pharmacy Records           Med Note Micah Ade   Wed Aug 10, 2023  9:38 AM)              Home Care and Equipment/Supplies: Were Home Health Services Ordered?: NA Any new equipment or medical supplies ordered?: NA  Functional Questionnaire: Do you need assistance with bathing/showering or dressing?: No Do you need assistance with meal preparation?: No Do you need assistance with eating?: No Do you have difficulty maintaining continence: No Do you need assistance with getting out of bed/getting out of a chair/moving?: No Do you have difficulty managing or taking your medications?:  No  Follow up appointments reviewed: PCP Follow-up appointment confirmed?: Yes Date of PCP follow-up appointment?: 08/24/23 Follow-up Provider: [facility] Follow-up appointment confirmed?: Yes Date of Specialist follow-up appointment?: 09/12/23 Follow-Up Specialty Provider:: neuro Do you need transportation to your follow-up appointment?:  No Do you understand care options if your condition(s) worsen?: Yes-patient verbalized understanding    SIGNATURE Darrall Ellison, LPN [facility] Nurse Health Advisor Direct Dial [phone]

## 2023-08-17 LAB — PROTHROMBIN GENE MUTATION

## 2023-08-22 LAB — FACTOR 5 LEIDEN

## 2023-08-23 ENCOUNTER — Encounter: Payer: Self-pay | Admitting: Nurse Practitioner

## 2023-08-24 ENCOUNTER — Encounter (HOSPITAL_COMMUNITY): Payer: Self-pay | Admitting: *Deleted

## 2023-08-24 ENCOUNTER — Other Ambulatory Visit: Payer: Self-pay

## 2023-08-24 ENCOUNTER — Emergency Department (HOSPITAL_COMMUNITY)
Admission: EM | Admit: 2023-08-24 | Discharge: 2023-08-25 | Disposition: A | Discharge status: Home / Self Care | Payer: No Typology Code available for payment source | Attending: Emergency Medicine | Admitting: Emergency Medicine

## 2023-08-24 ENCOUNTER — Inpatient Hospital Stay: Payer: No Typology Code available for payment source | Admitting: Nurse Practitioner

## 2023-08-24 DIAGNOSIS — S20352A Superficial foreign body of left front wall of thorax, initial encounter: Secondary | ICD-10-CM | POA: Insufficient documentation

## 2023-08-24 DIAGNOSIS — Z7982 Long term (current) use of aspirin: Secondary | ICD-10-CM | POA: Insufficient documentation

## 2023-08-24 DIAGNOSIS — X58XXXA Exposure to other specified factors, initial encounter: Secondary | ICD-10-CM | POA: Diagnosis not present

## 2023-08-24 NOTE — ED Triage Notes (Signed)
Loop recorder placed the 7th of this month she was attempting to remove the tape tonight and she thinks she pulled the loop out to far or dislodged it

## 2023-08-25 ENCOUNTER — Ambulatory Visit: Payer: No Typology Code available for payment source | Admitting: Nurse Practitioner

## 2023-08-25 ENCOUNTER — Encounter: Payer: Self-pay | Admitting: Nurse Practitioner

## 2023-08-25 ENCOUNTER — Telehealth: Payer: Self-pay

## 2023-08-25 VITALS — BP 122/84 | HR 83 | Temp 98.5°F | Ht 59.0 in | Wt 274.2 lb

## 2023-08-25 DIAGNOSIS — F4322 Adjustment disorder with anxiety: Secondary | ICD-10-CM | POA: Diagnosis not present

## 2023-08-25 DIAGNOSIS — L0231 Cutaneous abscess of buttock: Secondary | ICD-10-CM

## 2023-08-25 DIAGNOSIS — I10 Essential (primary) hypertension: Secondary | ICD-10-CM

## 2023-08-25 DIAGNOSIS — L680 Hirsutism: Secondary | ICD-10-CM

## 2023-08-25 DIAGNOSIS — Z8673 Personal history of transient ischemic attack (TIA), and cerebral infarction without residual deficits: Secondary | ICD-10-CM | POA: Diagnosis not present

## 2023-08-25 DIAGNOSIS — L03317 Cellulitis of buttock: Secondary | ICD-10-CM

## 2023-08-25 LAB — TESTOSTERONE,FREE AND TOTAL: Testosterone, Free: 1.5 pg/mL (ref 0.0–4.2)

## 2023-08-25 MED ORDER — AMOXICILLIN-POT CLAVULANATE 875-125 MG PO TABS: 1.0000 | ORAL_TABLET | Freq: Two times a day (BID) | ORAL | 0 refills | Status: DC

## 2023-08-25 MED ORDER — BACITRACIN ZINC 500 UNIT/GM EX OINT
TOPICAL_OINTMENT | Freq: Two times a day (BID) | CUTANEOUS | Status: DC
  Administered 2023-08-25: 1 via TOPICAL
  Filled 2023-08-25: qty 0.9

## 2023-08-25 NOTE — Assessment & Plan Note (Signed)
Improved swelling and decreased drainage. Has another f/up with surgeon

## 2023-08-25 NOTE — Progress Notes (Signed)
Established Patient Visit  Patient: Renee Hahn   DOB: 09/10/1970   53 y.o. Female  MRN: [id number] Visit Date: 08/25/2023  Subjective:    Chief Complaint  Patient presents with   Hospitalization Follow-up    Stroke on 08/10/23, went back to ED yesterday to have loop recorder removed   HPI Female hirsutism Excessive hair growth on chin S/p hysterectomy and oophrectomy 01/2023 due to menorrhagia and abnormal uterine biopsy. Benign pathology. Hx of prediabetes and hyperlipidemia.  Testosterone drawn during recent hospitalization: incomplete results Ordered Prolactin, testosterone, 17 hydroxyprogesterone, and DHEA sulfate  Cellulitis and abscess of buttock Improved swelling and decreased drainage. Has another f/up with surgeon  History of CVA (cerebrovascular accident) without residual deficits Hospitalization 08/10/23 -08/12/23: presented with difficulty reading and abnormal vision. These symptoms are all resolved. She is registered to the [employer]. She has f/up appointment with Dr. Alvina Filbert on 09/12/2023. Current use of plavix, ASA 81mg , lipitor, and zetia. TCM complete 08/15/23 MRA head: Acute infarct in the left MCA territory involving the left parietal and temporal lobes. No hemorrhage. Chronic infarct in the left frontal opercular region. No intracranial large vessel occlusion or significant stenosis. CTA neck: The common carotid, internal carotid and vertebral arteries cannot be adequately assessed due to a poor quality arterial contrast bolus. Transcranial doppler with bubbles: No HITS detected at rest or with Valsalva. Negative for right to left shunt.  Complete Echo with bubbles: normal Venous doppler-bilateral: negative DVT Carotid Duplex: The extracranial vessels were near-normal with only minimal wall thickening or plaque. Left Carotid: The extracranial vessels were near-normal with only minimal wall thickening or plaque. Vertebrals:  Bilateral  vertebral arteries demonstrate antegrade flow. Subclavians: Normal flow hemodynamics were seen in bilateral subclavian arteries.  Negative hypercoagulation panel Loop recorder inserted on 08/12/2023, but device protruded through skin after 13days. It was removed by ED provider. Sent message to Dr. Graciela Husbands to notify about removal.  No Home health, OT, or PT services needed   Reviewed medical, surgical, and social history today  Medications: Outpatient Medications Prior to Visit  Medication Sig   acetaminophen (TYLENOL) 650 MG CR tablet Take 650-1,300 mg by mouth every 8 (eight) hours as needed for pain.   aspirin EC 81 MG tablet Take 1 tablet (81 mg total) by mouth daily for 21 days. Swallow whole.   atorvastatin (LIPITOR) 80 MG tablet Take 1 tablet (80 mg total) by mouth daily.   clopidogrel (PLAVIX) 75 MG tablet Take 1 tablet (75 mg total) by mouth daily.   ezetimibe (ZETIA) 10 MG tablet Take 1 tablet (10 mg total) by mouth daily.   Sodium Fluoride (FLUORIDEX DT) Place 1 Application onto teeth 2 (two) times daily.   Study - OCEANIC-STROKE - asundexian 50 mg or placebo tablet (PI-Sethi) Take 1 tablet (50 mg total) by mouth daily. For Investigational Use Only   amoxicillin-clavulanate (AUGMENTIN) 875-125 MG tablet Take 1 tablet by mouth every 12 (twelve) hours.   polyethylene glycol (MIRALAX) 17 g packet Take 17 g by mouth daily as needed for mild constipation.   senna-docusate (SENOKOT-S) 8.6-50 MG tablet Take 1 tablet by mouth 2 (two) times daily.   trolamine salicylate (ASPERCREME) 10 % cream Apply 1 Application topically as needed for muscle pain. *as needed for legs*   No facility-administered medications prior to visit.   Reviewed past medical and social history.   ROS per HPI above  Last CBC  Lab Results  Component Value Date   WBC 7.5 08/11/2023   HGB 11.4 (L) 08/11/2023   HCT 36.6 08/11/2023   MCV 75.8 (L) 08/11/2023   MCH 23.6 (L) 08/11/2023   RDW 16.0 (H) 08/11/2023   PLT  251 08/11/2023   Last metabolic panel Lab Results  Component Value Date   GLUCOSE 101 (H) 08/11/2023   NA 141 08/11/2023   K 3.7 08/11/2023   CL 106 08/11/2023   CO2 25 08/11/2023   BUN 9 08/11/2023   CREATININE 0.97 08/11/2023   GFRNONAA >60 08/11/2023   CALCIUM 9.0 08/11/2023   PROT 6.1 (L) 08/11/2023   ALBUMIN 2.7 (L) 08/11/2023   LABGLOB 3.6 12/18/2019   AGRATIO 0.9 12/18/2019   BILITOT 0.9 08/11/2023   ALKPHOS 83 08/11/2023   AST 32 08/11/2023   ALT 24 08/11/2023   ANIONGAP 10 08/11/2023   Last lipids Lab Results  Component Value Date   CHOL 131 08/11/2023   HDL 25 (L) 08/11/2023   LDLCALC 87 08/11/2023   TRIG 96 08/11/2023   CHOLHDL 5.2 08/11/2023   Last hemoglobin A1c Lab Results  Component Value Date   HGBA1C 5.5 08/11/2023   Last thyroid functions Lab Results  Component Value Date   TSH 1.95 09/13/2022        Objective:  BP 122/84 (BP Location: Left Arm, Patient Position: Sitting, Cuff Size: Large)   Pulse 83   Temp 98.5 F (36.9 C)   Ht 4\' 11"  (1.499 m)   Wt 274 lb 3.2 oz (124.4 kg)   LMP 11/17/2022 (Approximate)   SpO2 100%   BMI 55.38 kg/m      Physical Exam Vitals and nursing note reviewed.  Cardiovascular:     Rate and Rhythm: Normal rate and regular rhythm.     Pulses: Normal pulses.     Heart sounds: Normal heart sounds.  Pulmonary:     Effort: Pulmonary effort is normal.     Breath sounds: Normal breath sounds.  Neurological:     Mental Status: She is alert and oriented to person, place, and time.     Cranial Nerves: No cranial nerve deficit.     Motor: No weakness.     Gait: Gait normal.     No results found for any visits on 08/25/23.    Assessment & Plan:    Problem List Items Addressed This Visit     Cellulitis and abscess of buttock   Improved swelling and decreased drainage. Has another f/up with surgeon      Female hirsutism   Excessive hair growth on chin S/p hysterectomy and oophrectomy 01/2023 due to  menorrhagia and abnormal uterine biopsy. Benign pathology. Hx of prediabetes and hyperlipidemia.  Testosterone drawn during recent hospitalization: incomplete results Ordered Prolactin, testosterone, 17 hydroxyprogesterone, and DHEA sulfate      Relevant Orders   Prolactin   Testosterone,Free and Total   17-Hydroxyprogesterone   DHEA-sulfate   History of CVA (cerebrovascular accident) without residual deficits - Primary   Hospitalization 08/10/23 -08/12/23: presented with difficulty reading and abnormal vision. These symptoms are all resolved. She is registered to the [employer]. She has f/up appointment with Dr. Alvina Filbert on 09/12/2023. Current use of plavix, ASA 81mg , lipitor, and zetia. TCM complete 08/15/23 MRA head: Acute infarct in the left MCA territory involving the left parietal and temporal lobes. No hemorrhage. Chronic infarct in the left frontal opercular region. No intracranial large vessel occlusion or significant stenosis. CTA neck: The common carotid,  internal carotid and vertebral arteries cannot be adequately assessed due to a poor quality arterial contrast bolus. Transcranial doppler with bubbles: No HITS detected at rest or with Valsalva. Negative for right to left shunt.  Complete Echo with bubbles: normal Venous doppler-bilateral: negative DVT Carotid Duplex: The extracranial vessels were near-normal with only minimal wall thickening or plaque. Left Carotid: The extracranial vessels were near-normal with only minimal wall thickening or plaque. Vertebrals:  Bilateral vertebral arteries demonstrate antegrade flow. Subclavians: Normal flow hemodynamics were seen in bilateral subclavian arteries.  Negative hypercoagulation panel Loop recorder inserted on 08/12/2023, but device protruded through skin after 13days. It was removed by ED provider. Sent message to Dr. Graciela Husbands to notify about removal.  No Home health, OT, or PT services needed      Hypertension   Morbid  obesity (HCC)   Other Visit Diagnoses       Adjustment disorder with anxiety       Relevant Orders   Ambulatory referral to Psychology      Return in about 3 months (around 11/22/2023) for HTN, hyperlipidemia (fasting).     Alysia Penna, NP

## 2023-08-25 NOTE — Transitions of Care (Post Inpatient/ED Visit) (Signed)
08/25/2023  Name: Renee Hahn MRN: [id number] DOB: 1971-02-04  Today's TOC FU Call Status:   Patient's Name and Date of Birth confirmed.  Transition Care Management Follow-up Telephone Call Date of Discharge: 08/25/23 Type of Discharge: Emergency Department Reason for ED Visit: Other: (Loop Recoder Issue) How have you been since you were released from the hospital?: Better Any questions or concerns?: No  Items Reviewed: Did you receive and understand the discharge instructions provided?: Yes People in Home: sibling(s)  Medications Reviewed Today: Medications Reviewed Today     Reviewed by Waymond Cera, CMA (Certified Medical Assistant) on 08/25/23 at 1344  Med List Status: <None>   Medication Order Taking? Sig Documenting Provider Last Dose Status Informant  acetaminophen (TYLENOL) 650 MG CR tablet [id number] Yes Take 650-1,300 mg by mouth every 8 (eight) hours as needed for pain. [provider] Taking Active Self, Pharmacy Records  amoxicillin-clavulanate (AUGMENTIN) 875-125 MG tablet [id number] Yes Take 1 tablet by mouth every 12 (twelve) hours. Tilden Fossa, MD Taking Active   aspirin EC 81 MG tablet [id number] Yes Take 1 tablet (81 mg total) by mouth daily for 21 days. Swallow whole. Kathlen Mody, MD Taking Active   atorvastatin (LIPITOR) 80 MG tablet [id number] Yes Take 1 tablet (80 mg total) by mouth daily. Nche, Bonna Gains, NP Taking Active Self, Pharmacy Records  clopidogrel (PLAVIX) 75 MG tablet [id number] Yes Take 1 tablet (75 mg total) by mouth daily. Kathlen Mody, MD Taking Active   ezetimibe (ZETIA) 10 MG tablet [id number] Yes Take 1 tablet (10 mg total) by mouth daily. Kathlen Mody, MD Taking Active   polyethylene glycol (MIRALAX) 17 g packet [id number] Yes Take 17 g by mouth daily as needed for mild constipation. Standley Brooking, MD Taking Active Self, Pharmacy Records           Med Note Jola Schmidt   Wed Aug 10, 2023 12:27 PM) >30  days.  senna-docusate (SENOKOT-S) 8.6-50 MG tablet [id number] Yes Take 1 tablet by mouth 2 (two) times daily. Standley Brooking, MD Taking Active Self, Pharmacy Records  Sodium Fluoride [facility] DT) [id number] Yes Place 1 Application onto teeth 2 (two) times daily. [provider] Taking Active Self, Pharmacy Records  Study - OCEANIC-STROKE - asundexian 50 mg or placebo tablet (PI-Sethi) [id number] Yes Take 1 tablet (50 mg total) by mouth daily. For Investigational Use Only Kathlen Mody, MD Taking Active   trolamine salicylate (ASPERCREME) 10 % cream [id number] Yes Apply 1 Application topically as needed for muscle pain. *as needed for legs* [provider] Taking Active Self, Pharmacy Records           Med Note Roxan Hockey, Simonne Martinet   Wed Aug 10, 2023  9:38 AM)              Home Care and Equipment/Supplies: Were Home Health Services Ordered?: NA Any new equipment or medical supplies ordered?: NA  Functional Questionnaire: Do you need assistance with bathing/showering or dressing?: No Do you need assistance with meal preparation?: No Do you need assistance with eating?: No Do you have difficulty maintaining continence: No Do you need assistance with getting out of bed/getting out of a chair/moving?: No Do you have difficulty managing or taking your medications?: No  Follow up appointments reviewed: PCP Follow-up appointment confirmed?: Yes Date of PCP follow-up appointment?: 08/25/23 Follow-up Provider: seen provider today Specialist Hospital Follow-up appointment confirmed?: NA Do you need transportation to your follow-up appointment?: No Do you understand care options  if your condition(s) worsen?: Yes-patient verbalized understanding    SIGNATURE Ian Bushman, cma

## 2023-08-25 NOTE — ED Provider Notes (Signed)
Renee Hahn   CSN: [id number] Arrival date & time: 08/24/23  2109     History  Chief Complaint  Patient presents with   loop recorder problem    Renee Hahn is a 53 y.o. female.  The history is provided by the patient and medical records.   Renee Hahn is a 53 y.o. female who presents to the Emergency Department complaining of loop recorder.  She presents to the emergency department for evaluation of loop recorder coming through her skin.  It was placed on February 7 when she was admitted for stroke.  She states that her Steri-Strips started to come off today and she started to peel at them and noticed that the loop recorder was coming through the skin.  She has mild local irritation and discomfort.  No fevers, nausea, vomiting.  She has been doing well since hospital discharge.  She is compliant with her Plavix.    Home Medications Prior to Admission medications   Medication Sig Start Date End Date Taking? Authorizing Provider  acetaminophen (TYLENOL) 650 MG CR tablet Take 650-1,300 mg by mouth every 8 (eight) hours as needed for pain.    [provider]  aspirin EC 81 MG tablet Take 1 tablet (81 mg total) by mouth daily for 21 days. Swallow whole. 08/12/23 09/02/23  Kathlen Mody, MD  atorvastatin (LIPITOR) 80 MG tablet Take 1 tablet (80 mg total) by mouth daily. 06/16/23   Nche, Bonna Gains, NP  clopidogrel (PLAVIX) 75 MG tablet Take 1 tablet (75 mg total) by mouth daily. 08/13/23   Kathlen Mody, MD  ezetimibe (ZETIA) 10 MG tablet Take 1 tablet (10 mg total) by mouth daily. 08/13/23   Kathlen Mody, MD  polyethylene glycol (MIRALAX) 17 g packet Take 17 g by mouth daily as needed for mild constipation. 07/08/23   Standley Brooking, MD  senna-docusate (SENOKOT-S) 8.6-50 MG tablet Take 1 tablet by mouth 2 (two) times daily. Patient taking differently: Take 1 tablet by mouth 2 (two) times daily as needed for  moderate constipation. 07/08/23   Standley Brooking, MD  Sodium Fluoride [facility] DT) Place 1 Application onto teeth 2 (two) times daily.    [provider]  Study - OCEANIC-STROKE - asundexian 50 mg or placebo tablet (PI-Sethi) Take 1 tablet (50 mg total) by mouth daily. For Investigational Use Only 08/13/23   Kathlen Mody, MD  trolamine salicylate (ASPERCREME) 10 % cream Apply 1 Application topically as needed for muscle pain. *as needed for legs*    [provider]      Allergies    Iodinated contrast media    Review of Systems   Review of Systems  All other systems reviewed and are negative.   Physical Exam Updated Vital Signs BP (!) 149/83   Pulse (!) 104   Temp 98.6 F (37 C)   Resp 18   Ht 4\' 11"  (1.499 m)   Wt 122.7 kg   LMP 11/17/2022 (Approximate)   SpO2 100%   BMI 54.64 kg/m  Physical Exam Vitals and nursing Hahn reviewed.  Constitutional:      Appearance: She is well-developed.  HENT:     Head: Normocephalic and atraumatic.  Cardiovascular:     Rate and Rhythm: Normal rate and regular rhythm.     Heart sounds: No murmur heard. Pulmonary:     Effort: Pulmonary effort is normal. No respiratory distress.     Comments: There is  approximately 1 cm of loop recorder protruding from the chest wall.  There is no surrounding erythema or induration. Musculoskeletal:        General: No tenderness.  Skin:    General: Skin is warm and dry.  Neurological:     Mental Status: She is alert and oriented to person, place, and time.  Psychiatric:        Behavior: Behavior normal.     ED Results / Procedures / Treatments   Labs (all labs ordered are listed, but only abnormal results are displayed) Labs Reviewed - No data to display  EKG None  Radiology No results found.  Procedures .Foreign Body Removal  Date/Time: 08/25/2023 12:32 AM  Performed by: Tilden Fossa, MD Authorized by: Tilden Fossa, MD  Consent: Verbal consent  obtained. Consent given by: patient Patient understanding: patient states understanding of the procedure being performed Patient identity confirmed: verbally with patient Body area: skin  Sedation: Patient sedated: no  Patient restrained: no Patient cooperative: yes Depth: subcutaneous Complexity: simple 1 objects recovered. Objects recovered: Loop recorder Post-procedure assessment: foreign body removed      Medications Ordered in ED Medications - No data to display  ED Course/ Medical Decision Making/ A&P                                 Medical Decision Making  Patient here for evaluation of her loop recorder coming through her skin.  On examination there is about 1 cm of exposed loop recorder, no evidence of surrounding soft tissue infection.  Discussed with Dr. Lily Peer with cardiology.  He recommends removing the loop recorder.  Also recommends empiric antibiotics.  Wound was cleaned and dressing was applied.  Wound was not closed due to concern for infection risk.  Plan to discharge home with outpatient follow-up and return precautions.        Final Clinical Impression(s) / ED Diagnoses Final diagnoses:  Foreign body of chest wall, left, initial encounter    Rx / DC Orders ED Discharge Orders     None         Tilden Fossa, MD 08/25/23 [phone]

## 2023-08-25 NOTE — Assessment & Plan Note (Addendum)
Hospitalization 08/10/23 -08/12/23: presented with difficulty reading and abnormal vision. These symptoms are all resolved. She is registered to the [employer]. She has f/up appointment with Dr. Alvina Filbert on 09/12/2023. Current use of plavix, ASA 81mg , lipitor, and zetia. TCM complete 08/15/23 MRA head: Acute infarct in the left MCA territory involving the left parietal and temporal lobes. No hemorrhage. Chronic infarct in the left frontal opercular region. No intracranial large vessel occlusion or significant stenosis. CTA neck: The common carotid, internal carotid and vertebral arteries cannot be adequately assessed due to a poor quality arterial contrast bolus. Transcranial doppler with bubbles: No HITS detected at rest or with Valsalva. Negative for right to left shunt.  Complete Echo with bubbles: normal Venous doppler-bilateral: negative DVT Carotid Duplex: The extracranial vessels were near-normal with only minimal wall thickening or plaque. Left Carotid: The extracranial vessels were near-normal with only minimal wall thickening or plaque. Vertebrals:  Bilateral vertebral arteries demonstrate antegrade flow. Subclavians: Normal flow hemodynamics were seen in bilateral subclavian arteries.  Negative hypercoagulation panel Loop recorder inserted on 08/12/2023, but device protruded through skin after 13days. It was removed by ED provider. Sent message to Dr. Graciela Husbands to notify about removal.  No Home health, OT, or PT services needed

## 2023-08-25 NOTE — Assessment & Plan Note (Addendum)
Excessive hair growth on chin S/p hysterectomy and oophrectomy 01/2023 due to menorrhagia and abnormal uterine biopsy. Benign pathology. Hx of prediabetes and hyperlipidemia.  Testosterone drawn during recent hospitalization: incomplete results Ordered Prolactin, testosterone, 17 hydroxyprogesterone, and DHEA sulfate

## 2023-08-31 ENCOUNTER — Ambulatory Visit (INDEPENDENT_AMBULATORY_CARE_PROVIDER_SITE_OTHER): Payer: No Typology Code available for payment source | Admitting: Nurse Practitioner

## 2023-08-31 ENCOUNTER — Ambulatory Visit (INDEPENDENT_AMBULATORY_CARE_PROVIDER_SITE_OTHER): Payer: Self-pay | Admitting: Licensed Clinical Social Worker

## 2023-08-31 ENCOUNTER — Encounter: Payer: Self-pay | Admitting: Nurse Practitioner

## 2023-08-31 ENCOUNTER — Encounter (HOSPITAL_COMMUNITY): Payer: Self-pay | Admitting: Licensed Clinical Social Worker

## 2023-08-31 VITALS — BP 126/78 | HR 94 | Temp 98.6°F | Ht 59.0 in | Wt 274.0 lb

## 2023-08-31 DIAGNOSIS — F4321 Adjustment disorder with depressed mood: Secondary | ICD-10-CM | POA: Diagnosis not present

## 2023-08-31 DIAGNOSIS — F321 Major depressive disorder, single episode, moderate: Secondary | ICD-10-CM

## 2023-08-31 DIAGNOSIS — F064 Anxiety disorder due to known physiological condition: Secondary | ICD-10-CM

## 2023-08-31 MED ORDER — ESCITALOPRAM OXALATE 10 MG PO TABS: 10.0000 mg | ORAL_TABLET | Freq: Every day | ORAL | 5 refills | Status: DC

## 2023-08-31 NOTE — Progress Notes (Signed)
 Established Patient Visit  Patient: Renee Hahn   DOB: 07-Aug-1970   53 y.o. Female  MRN: [id number] Visit Date: 08/31/2023  Subjective:    Chief Complaint  Patient presents with   Anxiety concerns    HPI Anxiety disorder due to medical condition Chronic, but worse in last 29month due to recent hospitalization on the week of husband and father's death anniversary. Reports irritability, restlessness, and easily overwhelmed. No previous use of antidepressant or antianxiety med. No previous CBT. No hx of IVC, no SI/HI/hallucination Support system at home: sister and brother Has therapy appointment today at 3pm.  We discussed use of vistaril prn vs SSRI daily in combination with therapy. We also discussed possible med side effects. She opted to start lexapro 5-10mg  daily. Will complete FMLA form for her appts with me and with the therapist. F/up in 29month  Reviewed medical, surgical, and social history today  Medications: Outpatient Medications Prior to Visit  Medication Sig   acetaminophen (TYLENOL) 650 MG CR tablet Take 650-1,300 mg by mouth every 8 (eight) hours as needed for pain.   aspirin EC 81 MG tablet Take 1 tablet (81 mg total) by mouth daily for 21 days. Swallow whole.   atorvastatin (LIPITOR) 80 MG tablet Take 1 tablet (80 mg total) by mouth daily.   clopidogrel (PLAVIX) 75 MG tablet Take 1 tablet (75 mg total) by mouth daily.   ezetimibe (ZETIA) 10 MG tablet Take 1 tablet (10 mg total) by mouth daily.   Sodium Fluoride (FLUORIDEX DT) Place 1 Application onto teeth 2 (two) times daily.   Study - OCEANIC-STROKE - asundexian 50 mg or placebo tablet (PI-Sethi) Take 1 tablet (50 mg total) by mouth daily. For Investigational Use Only   trolamine salicylate (ASPERCREME) 10 % cream Apply 1 Application topically as needed for muscle pain. *as needed for legs*   [DISCONTINUED] amoxicillin-clavulanate (AUGMENTIN) 875-125 MG tablet Take 1 tablet by mouth every 12  (twelve) hours.   [DISCONTINUED] polyethylene glycol (MIRALAX) 17 g packet Take 17 g by mouth daily as needed for mild constipation.   [DISCONTINUED] senna-docusate (SENOKOT-S) 8.6-50 MG tablet Take 1 tablet by mouth 2 (two) times daily.   No facility-administered medications prior to visit.   Reviewed past medical and social history.   ROS per HPI above      Objective:  BP 126/78 (BP Location: Right Arm, Patient Position: Sitting)   Pulse 94   Temp 98.6 F (37 C) (Temporal)   Ht 4\' 11"  (1.499 m)   Wt 274 lb (124.3 kg)   LMP 11/17/2022 (Approximate)   SpO2 97%   BMI 55.34 kg/m      Physical Exam Vitals and nursing note reviewed.  Psychiatric:        Attention and Perception: Attention normal.        Mood and Affect: Mood is anxious.        Speech: Speech normal.        Behavior: Behavior normal.        Cognition and Memory: Cognition and memory normal.        Judgment: Judgment normal.     No results found for any visits on 08/31/23.    Assessment & Plan:    Problem List Items Addressed This Visit     Anxiety disorder due to medical condition - Primary   Chronic, but worse in last 29month due to recent hospitalization on the  week of husband and father's death anniversary. Reports irritability, restlessness, and easily overwhelmed. No previous use of antidepressant or antianxiety med. No previous CBT. No hx of IVC, no SI/HI/hallucination Support system at home: sister and brother Has therapy appointment today at 3pm.  We discussed use of vistaril prn vs SSRI daily in combination with therapy. We also discussed possible med side effects. She opted to start lexapro 5-10mg  daily. Will complete FMLA form for her appts with me and with the therapist. F/up in 13month      Relevant Medications   escitalopram (LEXAPRO) 10 MG tablet   Other Visit Diagnoses       Complicated grief          Return in about 4 weeks (around 09/28/2023) for depression and anxiety.      Alysia Penna, NP

## 2023-08-31 NOTE — Patient Instructions (Addendum)
 Take plavix and aspirin 81mg  x 3weeks, then plavix only continuously. Maintain f/up appointment with neurology.  Start 1/2 tablet once daily for 1 week and then increase to a full tablet once daily continuously  We discussed common side effects such as nausea, drowsiness and weight gain.  Also discussed rare but serious side effect of suicide ideation.  She is instructed to discontinue medication go directly to ED if this occurs.  Plan follow up in 1 month to evaluate progress.     Have FMLA form sent to me and neurology. I will complete FMLA form based on visit frequency with me and the therapist.  Schedule lab appointment. Needs to before 11am

## 2023-08-31 NOTE — Assessment & Plan Note (Signed)
 Chronic, but worse in last 60month due to recent hospitalization on the week of husband and father's death anniversary. Reports irritability, restlessness, and easily overwhelmed. No previous use of antidepressant or antianxiety med. No previous CBT. No hx of IVC, no SI/HI/hallucination Support system at home: sister and brother Has therapy appointment today at 3pm.  We discussed use of vistaril prn vs SSRI daily in combination with therapy. We also discussed possible med side effects. She opted to start lexapro 5-10mg  daily. Will complete FMLA form for her appts with me and with the therapist. F/up in 60month

## 2023-08-31 NOTE — Progress Notes (Deleted)
   Established Patient Office Visit  Subjective   Patient ID: Renee Hahn, female    DOB: 06/07/1971  Age: 53 y.o. MRN: [id number]  Chief Complaint  Patient presents with   Anxiety   Depression    HPI  {History (Optional):23778}  ROS    Objective:     LMP 11/17/2022 (Approximate)  {Vitals History (Optional):23777}  Physical Exam   No results found for any visits on 08/31/23.  {Labs (Optional):23779}  The ASCVD Risk score (Arnett DK, et al., 2019) failed to calculate for the following reasons:   Risk score cannot be calculated because patient has a medical history suggesting prior/existing ASCVD    Assessment & Plan:   Problem List Items Addressed This Visit       Other   Anxiety disorder due to medical condition - Primary   Other Visit Diagnoses       Current moderate episode of major depressive disorder, unspecified whether recurrent (HCC)           No follow-ups on file.    Waynette Towers S, LCAS
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
## 2023-09-01 ENCOUNTER — Other Ambulatory Visit (INDEPENDENT_AMBULATORY_CARE_PROVIDER_SITE_OTHER): Payer: No Typology Code available for payment source

## 2023-09-01 ENCOUNTER — Telehealth: Payer: Self-pay | Admitting: Nurse Practitioner

## 2023-09-01 DIAGNOSIS — L680 Hirsutism: Secondary | ICD-10-CM | POA: Diagnosis not present

## 2023-09-01 NOTE — Telephone Encounter (Signed)
 Copied from CRM [id number]. Topic: General - Other >> Sep 01, 2023 12:28 PM Taleah C wrote: Reason for CRM: Victorino Dike, case Production designer, theatre/television/film with [employer], called to leave a contact information just incase there's anything she can do to be in assistance for the patient's plan of care. Tele: [phone] (direct line), 8am - 4:30pm m-fr EST.   Please advise.

## 2023-09-02 ENCOUNTER — Ambulatory Visit: Payer: No Typology Code available for payment source | Admitting: Nurse Practitioner

## 2023-09-02 NOTE — Telephone Encounter (Signed)
 Called and left a voice message asking to give me a call back at [phone] which is the direct number to my desk. Will try calling again

## 2023-09-05 ENCOUNTER — Encounter (HOSPITAL_COMMUNITY): Payer: Self-pay | Admitting: Licensed Clinical Social Worker

## 2023-09-05 ENCOUNTER — Ambulatory Visit (INDEPENDENT_AMBULATORY_CARE_PROVIDER_SITE_OTHER): Payer: Self-pay | Admitting: Licensed Clinical Social Worker

## 2023-09-05 ENCOUNTER — Encounter: Payer: Self-pay | Admitting: Nurse Practitioner

## 2023-09-05 DIAGNOSIS — F321 Major depressive disorder, single episode, moderate: Secondary | ICD-10-CM | POA: Diagnosis not present

## 2023-09-05 DIAGNOSIS — F064 Anxiety disorder due to known physiological condition: Secondary | ICD-10-CM

## 2023-09-05 LAB — TESTOSTERONE,FREE AND TOTAL
Testosterone, Free: 1.8 pg/mL (ref 0.0–4.2)
Testosterone: 31 ng/dL (ref 4–50)

## 2023-09-05 NOTE — Progress Notes (Signed)
 Virtual Visit via Video Note   I connected with [name] on 09/04/20 at 1:00-2:00am EST by a video enabled telemedicine application and verified that I am speaking with the correct person using two identifiers.   Location: Patient: Home Provider: Home Office   I discussed the limitations of evaluation and management by telemedicine and the availability of in person appointments. The patient expressed understanding and agreed to proceed.    THERAPIST PROGRESS NOTE  Session Time: 1pm  Participation Level: Active  Behavioral Response: CasualAlertEuthymic  Type of Therapy: Individual Therapy  Treatment Goals addressed: Meet with clinician weekly for therapy to monitor for progress towards goals and address any barriers to success; Reduce depression from average severity level of 6/10 down to a 4/10 in next 6 months by engaging in 1-2 positive coping skills daily as part of developing self-care routine; Reduce average anxiety level from 7/10 down to 5/10 in next 6 months by utilizing 1-2 relaxation skills/grounding skills per day, such as mindful breathing, progressive muscle relaxation, positive visualizations.   ProgressTowards Goals: Initial  Interventions: CBT  Summary: Renee Hahn is a 53 y.o. female who presents for today's session on time and was alert, oriented x5, with no evidence or self-report of SI/HI or A/V H.  Patient reported ongoing compliance with medication and denied any use of alcohol or illicit substances. Clinician inquired about patient's current emotional ratings, as well as any significant changes in thoughts, feelings or behavior since previous session.  Patient reported scores of 2/10 for depression, 3/10 for anxiety, 0/10 for anger/irritability, and denied any reoccurrence of panic attacks. Today was the first day of therapy so spent a majority of the session building a trusting therapeutic relationship. Cln also provided pt with coping skills for  anxiety and pt practiced them in session.  Suicidal/Homicidal: Nowithout intent/plan   Plan: Return again in 1week.  Diagnosis: Anxiety disorder due to medical condition  Current moderate episode of major depressive disorder, unspecified whether recurrent (HCC)  Collaboration of Care: Continue to work with providers  Patient/Guardian was advised Release of Information must be obtained prior to any record release in order to collaborate their care with an outside provider. Patient/Guardian was advised if they have not already done so to contact the registration department to sign all necessary forms in order for [name] to release information regarding their care.   Consent: Patient/Guardian gives verbal consent for treatment and assignment of benefits for services provided during this visit. Patient/Guardian expressed understanding and agreed to proceed.   Deloros Beretta S, LCAS 09/05/2023

## 2023-09-06 ENCOUNTER — Encounter: Payer: Self-pay | Admitting: Internal Medicine

## 2023-09-06 ENCOUNTER — Telehealth: Payer: Self-pay

## 2023-09-06 NOTE — Telephone Encounter (Signed)
 Call transferred to device clinic.  Nadine with [employer] calling requesting further information in regards to reimplantation of loop recorder for this Pt.  Per Michaell Cowing needs a letter from ordering physician indicating medical need for reimplantation of loop recorder.  Will discuss with Dr. Graciela Husbands.  Fax # for Gloriajean Dell is [phone].

## 2023-09-06 NOTE — Telephone Encounter (Signed)
 Dr. Graciela Husbands completed letter of necessity for loop recorder reimplantation.  Letter faxed to [facility] with [employer].  Fax confirmation received.

## 2023-09-07 LAB — 17-HYDROXYPROGESTERONE: 17-OH-Progesterone, LC/MS/MS: 10 ng/dL

## 2023-09-07 LAB — DHEA-SULFATE: DHEA-SO4: 72 ug/dL (ref 15–205)

## 2023-09-07 LAB — PROLACTIN: Prolactin: 6.2 ng/mL

## 2023-09-07 NOTE — Telephone Encounter (Signed)
 Nadine from [employer] to speak with you, requesting cb to [phone]

## 2023-09-13 ENCOUNTER — Ambulatory Visit: Payer: No Typology Code available for payment source | Admitting: Student

## 2023-09-14 ENCOUNTER — Encounter (HOSPITAL_COMMUNITY): Payer: Self-pay | Admitting: Licensed Clinical Social Worker

## 2023-09-14 ENCOUNTER — Ambulatory Visit (INDEPENDENT_AMBULATORY_CARE_PROVIDER_SITE_OTHER): Payer: Self-pay | Admitting: Licensed Clinical Social Worker

## 2023-09-14 DIAGNOSIS — F321 Major depressive disorder, single episode, moderate: Secondary | ICD-10-CM

## 2023-09-14 DIAGNOSIS — F064 Anxiety disorder due to known physiological condition: Secondary | ICD-10-CM

## 2023-09-20 ENCOUNTER — Ambulatory Visit (INDEPENDENT_AMBULATORY_CARE_PROVIDER_SITE_OTHER): Admitting: Licensed Clinical Social Worker

## 2023-09-20 DIAGNOSIS — F321 Major depressive disorder, single episode, moderate: Secondary | ICD-10-CM | POA: Diagnosis not present

## 2023-09-20 DIAGNOSIS — F064 Anxiety disorder due to known physiological condition: Secondary | ICD-10-CM

## 2023-09-22 ENCOUNTER — Other Ambulatory Visit: Payer: Self-pay | Admitting: Nurse Practitioner

## 2023-09-22 DIAGNOSIS — F064 Anxiety disorder due to known physiological condition: Secondary | ICD-10-CM

## 2023-09-25 ENCOUNTER — Encounter (HOSPITAL_COMMUNITY): Payer: Self-pay | Admitting: Licensed Clinical Social Worker

## 2023-09-25 NOTE — Patient Instructions (Signed)
 Renee Hahn

## 2023-09-25 NOTE — Progress Notes (Signed)
 Virtual Visit via Video Note   I connected with [name] on 09/13/20 2:00-3pm EDST by a video enabled telemedicine application and verified that I am speaking with the correct person using two identifiers.   Location: Patient: Home Provider: Home Office   I discussed the limitations of evaluation and management by telemedicine and the availability of in person appointments. The patient expressed understanding and agreed to proceed.    THERAPIST PROGRESS NOTE  Session Time: 2-3pm  Participation Level: Active  Behavioral Response: CasualAlertEuthymic  Type of Therapy: Individual Therapy  Treatment Goals addressed: Meet with clinician weekly for therapy to monitor for progress towards goals and address any barriers to success; Reduce depression from average severity level of 6/10 down to a 4/10 in next 6 months by engaging in 1-2 positive coping skills daily as part of developing self-care routine; Reduce average anxiety level from 7/10 down to 5/10 in next 6 months by utilizing 1-2 relaxation skills/grounding skills per day, such as mindful breathing, progressive muscle relaxation, positive visualizations.   ProgressTowards Goals: Initial  Interventions: CBT  Summary: [name] is a 53 y.o. female who presents for today's session on time and was alert, oriented x5, with no evidence or self-report of SI/HI or A/V H.  Patient reported ongoing compliance with medication and denied any use of alcohol or illicit substances. Clinician inquired about patient's current emotional ratings, as well as any significant changes in thoughts, feelings or .behavior since previous session.  Patient reported scores of 2/10 for depression, 5/10 for anxiety, 0/10 for anger/irritability, and denied any reoccurrence of panic attacks. Today was the 2nd day of therapy so spent a majority of the session building a trusting therapeutic relationship. Again, cln also provided pt with coping skills for  anxiety and pt practiced them in session.Cln provided psychoeducation on using mindfulness-based stress response to assist patient in lowering the stress response. Patient was encouraged to use mindfulness skills as a coping skill to assist in lowering stress response.   Suicidal/Homicidal: Nowithout intent/plan   Plan: Return again in 1week.  Diagnosis: Anxiety due to a medical condition, Depression  Collaboration of Care: Continue to work with providers  Patient/Guardian was advised Release of Information must be obtained prior to any record release in order to collaborate their care with an outside provider. Patient/Guardian was advised if they have not already done so to contact the registration department to sign all necessary forms in order for [name] to release information regarding their care.   Consent: Patient/Guardian gives verbal consent for treatment and assignment of benefits for services provided during this visit. Patient/Guardian expressed understanding and agreed to proceed.   Tasmine Hipwell S, LCAS 09/14/23

## 2023-09-27 ENCOUNTER — Ambulatory Visit (HOSPITAL_COMMUNITY): Admitting: Licensed Clinical Social Worker

## 2023-09-28 ENCOUNTER — Telehealth: Payer: Self-pay

## 2023-09-28 ENCOUNTER — Encounter: Payer: Self-pay | Admitting: Nurse Practitioner

## 2023-09-28 ENCOUNTER — Ambulatory Visit (INDEPENDENT_AMBULATORY_CARE_PROVIDER_SITE_OTHER): Payer: No Typology Code available for payment source | Admitting: Nurse Practitioner

## 2023-09-28 VITALS — BP 128/74 | HR 88 | Temp 98.3°F | Ht 59.0 in | Wt 270.6 lb

## 2023-09-28 DIAGNOSIS — F064 Anxiety disorder due to known physiological condition: Secondary | ICD-10-CM | POA: Diagnosis not present

## 2023-09-28 DIAGNOSIS — F419 Anxiety disorder, unspecified: Secondary | ICD-10-CM | POA: Diagnosis not present

## 2023-09-28 NOTE — Assessment & Plan Note (Addendum)
 Reports improved mood with lexapro Denies any adverse effects Started counseling sessions, has appointment once a week.  Maintain med dose and appts with counselor F/up in 3months

## 2023-09-28 NOTE — Progress Notes (Signed)
                Established Patient Visit  Patient: Renee Hahn   DOB: 10-24-1970   53 y.o. Female  MRN: [id number] Visit Date: 09/28/2023  Subjective:    Chief Complaint  Patient presents with   Follow-up    4 week f/u for depression and anxiety    HPI Anxiety disorder due to medical condition Reports improved mood with lexapro Denies any adverse effects Started counseling sessions, has appointment once a week.  Maintain med dose and appts with counselor F/up in 3months Appointment with counselor once a week  Reviewed medical, surgical, and social history today  Medications: Outpatient Medications Prior to Visit  Medication Sig   acetaminophen (TYLENOL) 650 MG CR tablet Take 650-1,300 mg by mouth every 8 (eight) hours as needed for pain.   atorvastatin (LIPITOR) 80 MG tablet Take 1 tablet (80 mg total) by mouth daily.   clopidogrel (PLAVIX) 75 MG tablet Take 1 tablet (75 mg total) by mouth daily.   escitalopram (LEXAPRO) 10 MG tablet TAKE 1 TABLET BY MOUTH EVERY DAY   ezetimibe (ZETIA) 10 MG tablet Take 1 tablet (10 mg total) by mouth daily.   Sodium Fluoride (FLUORIDEX DT) Place 1 Application onto teeth 2 (two) times daily.   Study - OCEANIC-STROKE - asundexian 50 mg or placebo tablet (PI-Sethi) Take 1 tablet (50 mg total) by mouth daily. For Investigational Use Only   trolamine salicylate (ASPERCREME) 10 % cream Apply 1 Application topically as needed for muscle pain. *as needed for legs*   No facility-administered medications prior to visit.   Reviewed past medical and social history.   ROS per HPI above      Objective:  BP 128/74 (BP Location: Left Arm, Patient Position: Sitting, Cuff Size: Large)   Pulse 88   Temp 98.3 F (36.8 C) (Temporal)   Ht 4\' 11"  (1.499 m)   Wt 270 lb 9.6 oz (122.7 kg)   LMP 11/17/2022 (Approximate)   SpO2 98%   BMI 54.65 kg/m      Physical Exam Vitals and nursing note reviewed.  Cardiovascular:     Rate and Rhythm:  Normal rate and regular rhythm.     Pulses: Normal pulses.     Heart sounds: Normal heart sounds.  Pulmonary:     Effort: Pulmonary effort is normal.     Breath sounds: Normal breath sounds.  Neurological:     Mental Status: She is alert and oriented to person, place, and time.  Psychiatric:        Mood and Affect: Mood normal.        Behavior: Behavior normal.        Thought Content: Thought content normal.     No results found for any visits on 09/28/23.    Assessment & Plan:    Problem List Items Addressed This Visit     Anxiety disorder due to medical condition - Primary   Reports improved mood with lexapro Denies any adverse effects Started counseling sessions, has appointment once a week.  Maintain med dose and appts with counselor F/up in 3months      Return in about 4 months (around 01/28/2024) for Maintain upcoming appointment in July and December.Alysia Penna, NP

## 2023-09-28 NOTE — Telephone Encounter (Signed)
 Faxed FMLA paperwork to [employer] LOA Department. Patient notified via MyChart about paperwork she gave at today's office visit was faxed.

## 2023-09-28 NOTE — Patient Instructions (Addendum)
 Maintain upcoming appointment in July and December. Your form will be completed and faxed

## 2023-09-29 ENCOUNTER — Ambulatory Visit (INDEPENDENT_AMBULATORY_CARE_PROVIDER_SITE_OTHER): Admitting: Licensed Clinical Social Worker

## 2023-09-29 ENCOUNTER — Encounter (HOSPITAL_COMMUNITY): Payer: Self-pay | Admitting: Licensed Clinical Social Worker

## 2023-09-29 DIAGNOSIS — F064 Anxiety disorder due to known physiological condition: Secondary | ICD-10-CM

## 2023-09-29 DIAGNOSIS — F321 Major depressive disorder, single episode, moderate: Secondary | ICD-10-CM

## 2023-09-29 NOTE — Progress Notes (Signed)
 Virtual Visit via Video Note   I connected with [name] on 09/28/20 11-12pm EDST by a video enabled telemedicine application and verified that I am speaking with the correct person using two identifiers.   Location: Patient: Home Provider: Home Office   I discussed the limitations of evaluation and management by telemedicine and the availability of in person appointments. The patient expressed understanding and agreed to proceed.    THERAPIST PROGRESS NOTE  Session Time: 11-12pm  Participation Level: Active  Behavioral Response: CasualAlertEuthymic  Type of Therapy: Individual Therapy  Treatment Goals addressed: Meet with clinician weekly for therapy to monitor for progress towards goals and address any barriers to success; Reduce depression from average severity level of 6/10 down to a 4/10 in next 6 months by engaging in 1-2 positive coping skills daily as part of developing self-care routine; Reduce average anxiety level from 7/10 down to 5/10 in next 6 months by utilizing 1-2 relaxation skills/grounding skills per day, such as mindful breathing, progressive muscle relaxation, positive visualizations.   ProgressTowards Goals: Initial  Interventions: CBT  Summary: Renee Hahn is a 53 y.o. female who presents for today's session on time and was alert, oriented x5, with no evidence or self-report of SI/HI or A/V H.  Patient reported ongoing compliance with medication and denied any use of alcohol or illicit substances. Clinician inquired about patient's current emotional ratings, as well as any significant changes in thoughts, feelings or .behavior since previous session.  Patient reported scores of 2/10 for depression, 5/10 for anxiety, 0/10 for anger/irritability, and denied any reoccurrence of panic attacks. Today was the 3rd day of therapy so continued building a trusting therapeutic relationship. Again, cln also provided pt with coping skills for anxiety and pt  practiced them in session. Pt discussed her job and relationship with son. Cln used CBT to assist in identifying positives to assess how her mood has been impacted.      Suicidal/Homicidal: Nowithout intent/plan   Plan: Return again in 1week.  Diagnosis: Anxiety due to a medical condition, Depression  Collaboration of Care: Continue to work with providers  Patient/Guardian was advised Release of Information must be obtained prior to any record release in order to collaborate their care with an outside provider. Patient/Guardian was advised if they have not already done so to contact the registration department to sign all necessary forms in order for [name] to release information regarding their care.   Consent: Patient/Guardian gives verbal consent for treatment and assignment of benefits for services provided during this visit. Patient/Guardian expressed understanding and agreed to proceed.   Via Rosado S, LCAS 09/29/23

## 2023-10-01 ENCOUNTER — Encounter (HOSPITAL_COMMUNITY): Payer: Self-pay | Admitting: Licensed Clinical Social Worker

## 2023-10-01 NOTE — Progress Notes (Signed)
 Virtual Visit via Video Note   I connected with Renee Hahn on 09/19/20 2:00-3pm EDST by a video enabled telemedicine application and verified that I am speaking with the correct person using two identifiers.   Location: Patient: Home Provider: Home Office   I discussed the limitations of evaluation and management by telemedicine and the availability of in person appointments. The patient expressed understanding and agreed to proceed.    THERAPIST PROGRESS NOTE  Session Time: 2-3pm  Participation Level: Active  Behavioral Response: CasualAlertEuthymic  Type of Therapy: Individual Therapy  Treatment Goals addressed: Meet with clinician weekly for therapy to monitor for progress towards goals and address any barriers to success; Reduce depression from average severity level of 6/10 down to a 4/10 in next 6 months by engaging in 1-2 positive coping skills daily as part of developing self-care routine; Reduce average anxiety level from 7/10 down to 5/10 in next 6 months by utilizing 1-2 relaxation skills/grounding skills per day, such as mindful breathing, progressive muscle relaxation, positive visualizations.   ProgressTowards Goals: Progressing  Interventions: CBT  Summary: Renee Hahn is a 53 y.o. female who presents for today's session on time and was alert, oriented x5, with no evidence or self-report of SI/HI or A/V H.  Patient reported ongoing compliance with medication and denied any use of alcohol or illicit substances. Clinician inquired about patient's current emotional ratings, as well as any significant changes in thoughts, feelings or .behavior since previous session.  Patient reported scores of 2/10 for depression, 5/10 for anxiety, 0/10 for anger/irritability, and denied any panic attacks. Today was the 3rd day of therapy so continued building a trusting  therapeutic relationship.  cln also provided pt with coping skills for anxiety: Positive visualizations and  gave pt information bout other You Tube visualizations, Cln used CBT to help pt process thoughts, feelings, and behaviors. Cln provided supportive feedback.   Suicidal/Homicidal: Nowithout intent/plan   Plan: Return again in 1week.  Diagnosis: Anxiety due to a medical condition, Depression  Collaboration of Care: Continue to work with providers  Patient/Guardian was advised Release of Information must be obtained prior to any record release in order to collaborate their care with an outside provider. Patient/Guardian was advised if they have not already done so to contact the registration department to sign all necessary forms in order for [name] to release information regarding their care.   Consent: Patient/Guardian gives verbal consent for treatment and assignment of benefits for services provided during this visit. Patient/Guardian expressed understanding and agreed to proceed.   Aryan Sparks S, LCAS 09/20/23

## 2023-10-04 ENCOUNTER — Encounter (HOSPITAL_COMMUNITY): Payer: Self-pay | Admitting: Licensed Clinical Social Worker

## 2023-10-04 ENCOUNTER — Ambulatory Visit (INDEPENDENT_AMBULATORY_CARE_PROVIDER_SITE_OTHER): Admitting: Licensed Clinical Social Worker

## 2023-10-04 DIAGNOSIS — F321 Major depressive disorder, single episode, moderate: Secondary | ICD-10-CM

## 2023-10-04 DIAGNOSIS — F064 Anxiety disorder due to known physiological condition: Secondary | ICD-10-CM

## 2023-10-04 NOTE — Progress Notes (Signed)
 Virtual Visit via Video Note   I connected with Renee Hahn on 10/04/23 1-2pm EDST by a video enabled telemedicine application and verified that I am speaking with the correct person using two identifiers.   Location: Patient: Home Provider: Home Office   I discussed the limitations of evaluation and management by telemedicine and the availability of in person appointments. The patient expressed understanding and agreed to proceed.    THERAPIST PROGRESS NOTE  Session Time: 1-2pm  Participation Level: Active  Behavioral Response: CasualAlertEuthymic  Type of Therapy: Individual Therapy  Treatment Goals addressed: Meet with clinician weekly for therapy to monitor for progress towards goals and address any barriers to success; Reduce depression from average severity level of 6/10 down to a 4/10 in next 6 months by engaging in 1-2 positive coping skills daily as part of developing self-care routine; Reduce average anxiety level from 7/10 down to 5/10 in next 6 months by utilizing 1-2 relaxation skills/grounding skills per day, such as mindful breathing, progressive muscle relaxation, positive visualizations.   ProgressTowards Goals: Initial  Interventions: CBT  Summary: Renee Hahn is a 53 y.o. female who presents for today's session on time and was alert, oriented x5, with no evidence or self-report of SI/HI or A/V H.  Patient reported ongoing compliance with medication and denied any use of alcohol or illicit substances. Clinician inquired about patient's current emotional ratings, as well as any significant changes in thoughts, feelings or behaviors since previous session.  Patient reported scores of 3/10 for depression, 5/10 for anxiety, 0/10 for anger/irritability, and denied any panic attacks.  Pt reports on her current stressors: work, health, family relationships. Cln used CBT to assist pt in identifying positives to assess how her mood has been impacted.by her stressors.  Cln allowed space for pt to further process her emotions and clinician provided supportive statements throughout session.       Suicidal/Homicidal: Nowithout intent/plan   Plan: Return again in 1week.  Diagnosis: Anxiety due to a medical condition, Depression  Collaboration of Care: Continue to work with providers  Patient/Guardian was advised Release of Information must be obtained prior to any record release in order to collaborate their care with an outside provider. Patient/Guardian was advised if they have not already done so to contact the registration department to sign all necessary forms in order for [name] to release information regarding their care.   Consent: Patient/Guardian gives verbal consent for treatment and assignment of benefits for services provided during this visit. Patient/Guardian expressed understanding and agreed to proceed.   Marieliz Strang S, LCAS 10/04/23

## 2023-10-07 ENCOUNTER — Encounter: Payer: Self-pay | Admitting: Internal Medicine

## 2023-10-07 ENCOUNTER — Ambulatory Visit: Attending: Internal Medicine | Admitting: Internal Medicine

## 2023-10-07 VITALS — BP 124/86 | HR 85 | Ht 59.0 in | Wt 271.0 lb

## 2023-10-07 DIAGNOSIS — Z8673 Personal history of transient ischemic attack (TIA), and cerebral infarction without residual deficits: Secondary | ICD-10-CM

## 2023-10-07 DIAGNOSIS — D649 Anemia, unspecified: Secondary | ICD-10-CM | POA: Diagnosis not present

## 2023-10-07 NOTE — Progress Notes (Signed)
 Patient Care Team: Nche, Bonna Gains, NP as PCP - General (Internal Medicine) Duke Salvia, MD as PCP - Cardiology (Cardiology)   HPI  Renee Hahn is a 53 y.o. female  Morbidly obese seen following an ILR implant at hospital for Cryptogenic Stroke which had to be removed 2/2 protrusion;  was enrolled in the [place] FX1 trial   Echo EF 55% with severely dilated RA; normal LA and no shunt  Date Cr K Hgb  2/25 0.97 3.7 11.4<<12.9            Records and Results Reviewed   Past Medical History:  Diagnosis Date   Abscess, gluteal, left 07/05/2023   Acute ischemic stroke (HCC) 08/11/2023   BMI 50.0-59.9, adult (HCC) 01/28/2020   Cellulitis and abscess of buttock 07/04/2023   Cholelithiasis    Hay fever    Headache    past   Hyperlipidemia    Iron deficiency anemia    Menorrhagia 12/18/2019   Prediabetes    Sepsis (HCC) 07/06/2023   Stroke (HCC)    Vitamin B12 deficiency 02/27/2021    Past Surgical History:  Procedure Laterality Date   BREAST BIOPSY     BREAST EXCISIONAL BIOPSY Left 12/03/2021   benign   BREAST LUMPECTOMY WITH RADIOACTIVE SEED LOCALIZATION Left 12/03/2021   Procedure: LEFT BREAST LUMPECTOMY WITH RADIOACTIVE SEED LOCALIZATION;  Surgeon: Harriette Bouillon, MD;  Location: [facility] OR;  Service: General;  Laterality: Left;   CHOLECYSTECTOMY N/A 09/21/2019   Procedure: LAPAROSCOPIC CHOLECYSTECTOMY WITH INTRAOPERATIVE CHOLANGIOGRAM;  Surgeon: Harriette Bouillon, MD;  Location: [facility] ORS;  Service: General;  Laterality: N/A;   DILITATION & CURRETTAGE/HYSTROSCOPY WITH HYDROTHERMAL ABLATION N/A 11/17/2022   Procedure: DILATATION & CURETTAGE/HYSTEROSCOPY WITH HYDROTHERMAL ABLATION;  Surgeon: Gerald Leitz, MD;  Location: [facility] OR;  Service: Gynecology;  Laterality: N/A;   IRRIGATION AND DEBRIDEMENT BUTTOCKS Left 07/05/2023   Procedure: IRRIGATION AND DEBRIDEMENT LEFT BUTTOCKS;  Surgeon: Romie Levee, MD;  Location: [facility] ORS;  Service: General;  Laterality: Left;    LOOP RECORDER INSERTION N/A 08/12/2023   Procedure: LOOP RECORDER INSERTION;  Surgeon: Duke Salvia, MD;  Location: [facility] INVASIVE CV LAB;  Service: Cardiovascular;  Laterality: N/A;   ROBOTIC ASSISTED TOTAL HYSTERECTOMY WITH BILATERAL SALPINGO OOPHERECTOMY N/A 01/11/2023   Procedure: XI ROBOTIC ASSISTED TOTAL HYSTERECTOMY >250g WITH BILATERAL SALPINGO OOPHORECTOMY, MINI LAPAROTOMY;  Surgeon: Clide Cliff, MD;  Location: [facility] ORS;  Service: Gynecology;  Laterality: N/A;   SENTINEL NODE BIOPSY N/A 01/11/2023   Procedure: SENTINEL NODE BIOPSY;  Surgeon: Clide Cliff, MD;  Location: [facility] ORS;  Service: Gynecology;  Laterality: N/A;   THROMBECTOMY  2022    Current Meds  Medication Sig   acetaminophen (TYLENOL) 650 MG CR tablet Take 650-1,300 mg by mouth every 8 (eight) hours as needed for pain.   atorvastatin (LIPITOR) 80 MG tablet Take 1 tablet (80 mg total) by mouth daily.   clopidogrel (PLAVIX) 75 MG tablet Take 1 tablet (75 mg total) by mouth daily.   escitalopram (LEXAPRO) 10 MG tablet TAKE 1 TABLET BY MOUTH EVERY DAY   ezetimibe (ZETIA) 10 MG tablet Take 1 tablet (10 mg total) by mouth daily.   Sodium Fluoride (FLUORIDEX DT) Place 1 Application onto teeth 2 (two) times daily.   Study - OCEANIC-STROKE - asundexian 50 mg or placebo tablet (PI-Sethi) Take 1 tablet (50 mg total) by mouth daily. For Investigational Use Only   trolamine salicylate (ASPERCREME) 10 % cream Apply 1 Application topically as needed  for muscle pain. *as needed for legs*    Allergies  Allergen Reactions   Iodinated Contrast Media Itching      Review of Systems negative except from HPI and PMH  Physical Exam BP 124/86   Pulse 85   Ht 4\' 11"  (1.499 m)   Wt 271 lb (122.9 kg)   LMP 11/17/2022 (Approximate)   SpO2 99%   BMI 54.74 kg/m  Well developed and Morbidly obese  in no acute distress HENT normal E scleral and icterus clear Neck Supple JVP flat; carotids brisk and full Clear to  ausculation Regular rate and rhythm, no murmurs gallops or rub Soft with active bowel sounds No clubbing cyanosis  Edema Alert and oriented, grossly normal motor and sensory function Skin Warm and Dry  ECG sinus @ 85 15/08/38  CrCl cannot be calculated (Patient's most recent lab result is older than the maximum 21 days allowed.).   Assessment and  Plan Cryptogenic Stroke  Hisruitism  HTN  Morbidly obese  [place] Trial  XI inhibitor   BP well controlled Continue plavix and study drug  Pre op Dx Cryptogenic Stroke Post op Dx Same  Procedure  Loop Recorder implantation  DESCRIPTION OF PROCEDURE:  Informed written consent was obtained.   Time Out Completed with RN   After routine prep and drape of the left parasternal area, a small incision was created. A Medtronic LINQ Reveal Loop Recorder  Serial Number  [id number] was inserted.    SteriStrip dressing was  applied.  The patient tolerated the procedure without apparent complication.  EBL < 10cc      Current medicines are reviewed at length with the patient today .  The patient does not.cryp  have concerns regarding medicines.

## 2023-10-07 NOTE — Patient Instructions (Addendum)
 Medication Instructions:  Your physician recommends that you continue on your current medications as directed. Please refer to the Current Medication list given to you today.  *If you need a refill on your cardiac medications before your next appointment, please call your pharmacy*  Lab Work: CBC today If you have labs (blood work) drawn today and your tests are completely normal, you will receive your results only by: MyChart Message (if you have MyChart) OR A paper copy in the mail If you have any lab test that is abnormal or we need to change your treatment, we will call you to review the results.  Testing/Procedures: None ordered.   Follow-Up: At [facility], you and your health needs are our priority.  As part of our continuing mission to provide you with exceptional heart care, our providers are all part of one team.  This team includes your primary Cardiologist (physician) and Advanced Practice Providers or APPs (Physician Assistants and Nurse Practitioners) who all work together to provide you with the care you need, when you need it.  Your next appointment:   Follow up as needed  We recommend signing up for the patient portal called "MyChart".  Sign up information is provided on this After Visit Summary.  MyChart is used to connect with patients for Virtual Visits (Telemedicine).  Patients are able to view lab/test results, encounter notes, upcoming appointments, etc.  Non-urgent messages can be sent to your provider as well.   To learn more about what you can do with MyChart, go to [email or web address].      Implantable Loop Recorder Placement, Care After This sheet gives you information about how to care for yourself after your procedure. Your health care provider may also give you more specific instructions. If you have problems or questions, contact your health care provider. What can I expect after the procedure? After the procedure, it is common to  have: Soreness or discomfort near the incision. Some swelling or bruising near the incision.  Follow these instructions at home: Incision care  Monitor your cardiac device site for redness, swelling, and drainage. Call the device clinic at [phone] if you experience these symptoms or fever/chills.  Keep the large square bandage on your site for 3 days and then you may remove it yourself. Keep the steri-strips underneath in place.   You may shower  tomorrow with large bandage on.  Keep the steri-strips in place. They will usually fall off on their own, or may be removed after 10 days. Pat dry.   Avoid lotions, ointments, or perfumes over your incision until it is well-healed.  Please do not submerge in water until your site is completely healed.   Your device is MRI compatible.   Remote monitoring is used to monitor your cardiac device from home. This monitoring is scheduled every month by our office. It allows [name] to keep an eye on the function of your device to ensure it is working properly.  If your wound site starts to bleed apply pressure.    For help with the monitor please call Medtronic Monitor Support Specialist directly at [phone].    If you have any questions/concerns please call the device clinic at [phone].  Activity  Return to your normal activities.  General instructions Follow instructions from your health care provider about how to manage your implantable loop recorder and transmit the information. Learn how to activate a recording if this is necessary for your type of device. You may go through a  metal detection gate, and you may let someone hold a metal detector over your chest. Show your ID card if needed. Do not have an MRI unless you check with your health care provider first. Take over-the-counter and prescription medicines only as told by your health care provider. Keep all follow-up visits as told by your health care provider. This is  important. Contact a health care provider if: You have redness, swelling, or pain around your incision. You have a fever. You have pain that is not relieved by your pain medicine. You have triggered your device because of fainting (syncope) or because of a heartbeat that feels like it is racing, slow, fluttering, or skipping (palpitations). Get help right away if you have: Chest pain. Difficulty breathing. Summary After the procedure, it is common to have soreness or discomfort near the incision. Change your dressing as told by your health care provider. Follow instructions from your health care provider about how to manage your implantable loop recorder and transmit the information. Keep all follow-up visits as told by your health care provider. This is important. This information is not intended to replace advice given to you by your health care provider. Make sure you discuss any questions you have with your health care provider. Document Released: 06/02/2015 Document Revised: 08/06/2017 Document Reviewed: 08/06/2017 Elsevier Patient Education  2020 Elsevier Inc.       1st Floor: - Lobby - Registration  - Pharmacy  - Lab - Cafe  2nd Floor: - PV Lab - Diagnostic Testing (echo, CT, nuclear med)  3rd Floor: - Vacant  4th Floor: - TCTS (cardiothoracic surgery) - AFib Clinic - Structural Heart Clinic - Vascular Surgery  - Vascular Ultrasound  5th Floor: - HeartCare Cardiology (general and EP) - Clinical Pharmacy for coumadin, hypertension, lipid, weight-loss medications, and med management appointments    Valet parking services will be available as well.

## 2023-10-08 LAB — CBC
Hematocrit: 32 % — ABNORMAL LOW (ref 34.0–46.6)
Hemoglobin: 9.5 g/dL — ABNORMAL LOW (ref 11.1–15.9)
MCH: 20.7 pg — ABNORMAL LOW (ref 26.6–33.0)
MCHC: 29.7 g/dL — ABNORMAL LOW (ref 31.5–35.7)
MCV: 70 fL — ABNORMAL LOW (ref 79–97)
Platelets: 348 10*3/uL (ref 150–450)
RBC: 4.58 x10E6/uL (ref 3.77–5.28)
RDW: 16.5 % — ABNORMAL HIGH (ref 11.7–15.4)
WBC: 10.5 10*3/uL (ref 3.4–10.8)

## 2023-10-11 ENCOUNTER — Ambulatory Visit (HOSPITAL_COMMUNITY): Admitting: Licensed Clinical Social Worker

## 2023-10-11 DIAGNOSIS — F32A Depression, unspecified: Secondary | ICD-10-CM | POA: Diagnosis not present

## 2023-10-11 DIAGNOSIS — F064 Anxiety disorder due to known physiological condition: Secondary | ICD-10-CM | POA: Diagnosis not present

## 2023-10-11 DIAGNOSIS — F321 Major depressive disorder, single episode, moderate: Secondary | ICD-10-CM

## 2023-10-12 ENCOUNTER — Encounter (HOSPITAL_COMMUNITY): Payer: Self-pay | Admitting: Licensed Clinical Social Worker

## 2023-10-12 NOTE — Progress Notes (Signed)
 Virtual Visit via Video Note   I connected with Renee Hahn on 10/11/23 1-2pm EDST by a video enabled telemedicine application and verified that I am speaking with the correct person using two identifiers.   Location: Patient: Home Provider: Home Office   I discussed the limitations of evaluation and management by telemedicine and the availability of in person appointments. The patient expressed understanding and agreed to proceed.    THERAPIST PROGRESS NOTE  Session Time: 1-2pm  Participation Level: Active  Behavioral Response: CasualAlertEuthymic  Type of Therapy: Individual Therapy  Treatment Goals addressed: Meet with clinician weekly for therapy to monitor for progress towards goals and address any barriers to success; Reduce depression from average severity level of 6/10 down to a 4/10 in next 6 months by engaging in 1-2 positive coping skills daily as part of developing self-care routine; Reduce average anxiety level from 7/10 down to 5/10 in next 6 months by utilizing 1-2 relaxation skills/grounding skills per day, such as mindful breathing, progressive muscle relaxation, positive visualizations.   ProgressTowards Goals: Initial  Interventions: CBT  Summary: Renee Hahn is a 53 y.o. female who presents for today's session on time and was alert, oriented x5, with no evidence or self-report of SI/HI or A/V H.  Patient reported ongoing compliance with medication and denied any use of alcohol or illicit substances. Clinician inquired about patient's current emotional ratings, as well as any significant changes in thoughts, feelings or behaviors since previous session.  Patient reported scores of 3/10 for depression, 5/10 for anxiety, 0/10 for anger/irritability, and denied any panic attacks.  Pt reports on her current stressors: work, health, family relationships. Cln used CBT to assist pt in identifying positives to assess how her mood has been impacted.by her stressors.  Cln allowed space for pt to further process her emotions and clinician provided supportive statements throughout session.       Suicidal/Homicidal: Nowithout intent/plan   Plan: Return again in 1week.  Diagnosis: Anxiety due to a medical condition, Depression  Collaboration of Care: Continue to work with providers  Patient/Guardian was advised Release of Information must be obtained prior to any record release in order to collaborate their care with an outside provider. Patient/Guardian was advised if they have not already done so to contact the registration department to sign all necessary forms in order for [name] to release information regarding their care.   Consent: Patient/Guardian gives verbal consent for treatment and assignment of benefits for services provided during this visit. Patient/Guardian expressed understanding and agreed to proceed.   Elwood Bazinet S, LCAS 10/11/23

## 2023-10-18 ENCOUNTER — Ambulatory Visit (INDEPENDENT_AMBULATORY_CARE_PROVIDER_SITE_OTHER): Admitting: Licensed Clinical Social Worker

## 2023-10-18 DIAGNOSIS — F32A Depression, unspecified: Secondary | ICD-10-CM

## 2023-10-18 DIAGNOSIS — F321 Major depressive disorder, single episode, moderate: Secondary | ICD-10-CM

## 2023-10-18 DIAGNOSIS — F064 Anxiety disorder due to known physiological condition: Secondary | ICD-10-CM | POA: Diagnosis not present

## 2023-10-19 ENCOUNTER — Encounter (HOSPITAL_COMMUNITY): Payer: Self-pay | Admitting: Licensed Clinical Social Worker

## 2023-10-19 NOTE — Progress Notes (Signed)
 Virtual Visit via Video Note   I connected with Renee Hahn on 10/18/23 1-2pm EDST by a video enabled telemedicine application and verified that I am speaking with the correct person using two identifiers.   Location: Patient: Home Provider: Home Office   I discussed the limitations of evaluation and management by telemedicine and the availability of in person appointments. The patient expressed understanding and agreed to proceed.    THERAPIST PROGRESS NOTE  Session Time: 1-2pm  Participation Level: Active  Behavioral Response: CasualAlertEuthymic  Type of Therapy: Individual Therapy  Treatment Goals addressed: Meet with clinician weekly for therapy to monitor for progress towards goals and address any barriers to success; Reduce depression from average severity level of 6/10 down to a 4/10 in next 6 months by engaging in 1-2 positive coping skills daily as part of developing self-care routine; Reduce average anxiety level from 7/10 down to 5/10 in next 6 months by utilizing 1-2 relaxation skills/grounding skills per day, such as mindful breathing, progressive muscle relaxation, positive visualizations.   ProgressTowards Goals: Initial  Interventions: CBT  Summary: Renee Hahn is a 53 y.o. female who presents for today's session on time and was alert, oriented x5, with no evidence or self-report of SI/HI or A/V H.  Patient reported ongoing compliance with medication and denied any use of alcohol or illicit substances. Clinician inquired about patient's current emotional ratings, as well as any significant changes in thoughts, feelings or behaviors since previous session.  Patient reported scores of 3/10 for depression, 5/10 for anxiety, 0/10 for anger/irritability, and denied any panic attacks.  Pt reports on her current stressors: work, health, family relationships. Clinician utilized MI OARS to reflect and summarize thoughts and feelings.    Suicidal/Homicidal: Nowithout  intent/plan   Plan: Return again in 1week.  Diagnosis: Anxiety due to a medical condition, Depression  Collaboration of Care: Continue to work with providers  Patient/Guardian was advised Release of Information must be obtained prior to any record release in order to collaborate their care with an outside provider. Patient/Guardian was advised if they have not already done so to contact the registration department to sign all necessary forms in order for us  to release information regarding their care.   Consent: Patient/Guardian gives verbal consent for treatment and assignment of benefits for services provided during this visit. Patient/Guardian expressed understanding and agreed to proceed.   Conleigh Heinlein S, LCAS 10/18/23

## 2023-10-25 ENCOUNTER — Ambulatory Visit (HOSPITAL_COMMUNITY): Admitting: Licensed Clinical Social Worker

## 2023-10-27 ENCOUNTER — Ambulatory Visit (INDEPENDENT_AMBULATORY_CARE_PROVIDER_SITE_OTHER): Admitting: Licensed Clinical Social Worker

## 2023-10-27 ENCOUNTER — Other Ambulatory Visit: Payer: Self-pay

## 2023-10-27 DIAGNOSIS — D649 Anemia, unspecified: Secondary | ICD-10-CM

## 2023-10-27 DIAGNOSIS — F321 Major depressive disorder, single episode, moderate: Secondary | ICD-10-CM

## 2023-10-27 DIAGNOSIS — F064 Anxiety disorder due to known physiological condition: Secondary | ICD-10-CM | POA: Diagnosis not present

## 2023-10-31 ENCOUNTER — Encounter (HOSPITAL_COMMUNITY): Payer: Self-pay | Admitting: Licensed Clinical Social Worker

## 2023-10-31 NOTE — Progress Notes (Signed)
 Virtual Visit via Video Note   I connected with [name] on 10/27/23 11-12pm EDT by a video enabled telemedicine application and verified that I am speaking with the correct person using two identifiers.   Location: Patient: Home Provider: Home Office   I discussed the limitations of evaluation and management by telemedicine and the availability of in person appointments. The patient expressed understanding and agreed to proceed.    THERAPIST PROGRESS NOTE  Session Time: 11-12pm  Participation Level: Active  Behavioral Response: CasualAlertEuthymic  Type of Therapy: Individual Therapy  Treatment Goals addressed: Meet with clinician weekly for therapy to monitor for progress towards goals and address any barriers to success; Reduce depression from average severity level of 6/10 down to a 4/10 in next 6 months by engaging in 1-2 positive coping skills daily as part of developing self-care routine; Reduce average anxiety level from 7/10 down to 5/10 in next 6 months by utilizing 1-2 relaxation skills/grounding skills per day, such as mindful breathing, progressive muscle relaxation, positive visualizations.   ProgressTowards Goals: Progressing  Interventions: CBT  Summary: Renee Hahn is a 53 y.o. female who presents for today's session on time and was alert, oriented x5, with no evidence or self-report of SI/HI or A/V H.  Patient reported ongoing compliance with medication and denied any use of alcohol or illicit substances. Clinician inquired about patient's current emotional ratings, as well as any significant changes in thoughts, feelings or behaviors since previous session. Patient reported scores of 3/10 for depression, 5/10 for anxiety, 0/10 for anger/irritability, and denied any panic attacks.  Pt reports on her current stressors: work, health, family relationships. 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 Majority of skill practice was focused on identifying specific problem (and priority/goal) and brainstorming  options/alternatives.      Suicidal/Homicidal: Nowithout intent/plan   Plan: Return again in 1week.  Diagnosis: Anxiety due to a medical condition, Depression  Collaboration of Care: Continue to work with providers  Patient/Guardian was advised Release of Information must be obtained prior to any record release in order to collaborate their care with an outside provider. Patient/Guardian was advised if they have not already done so to contact the registration department to sign all necessary forms in order for us  to release information regarding their care.   Consent: Patient/Guardian gives verbal consent for treatment and assignment of benefits for services provided during this visit. Patient/Guardian expressed understanding and agreed to proceed.   Arcenio Mullaly S, LCAS 10/27/23

## 2023-11-01 ENCOUNTER — Ambulatory Visit (HOSPITAL_COMMUNITY): Admitting: Licensed Clinical Social Worker

## 2023-11-01 ENCOUNTER — Encounter (HOSPITAL_COMMUNITY): Payer: Self-pay

## 2023-11-03 ENCOUNTER — Ambulatory Visit (HOSPITAL_COMMUNITY): Admitting: Licensed Clinical Social Worker

## 2023-11-08 ENCOUNTER — Ambulatory Visit (INDEPENDENT_AMBULATORY_CARE_PROVIDER_SITE_OTHER): Admitting: Licensed Clinical Social Worker

## 2023-11-08 DIAGNOSIS — F064 Anxiety disorder due to known physiological condition: Secondary | ICD-10-CM | POA: Diagnosis not present

## 2023-11-08 DIAGNOSIS — F321 Major depressive disorder, single episode, moderate: Secondary | ICD-10-CM

## 2023-11-09 ENCOUNTER — Other Ambulatory Visit (HOSPITAL_COMMUNITY): Payer: Self-pay | Admitting: Pharmacist

## 2023-11-09 ENCOUNTER — Other Ambulatory Visit: Payer: Self-pay | Admitting: Neurology

## 2023-11-09 MED ORDER — CLOPIDOGREL BISULFATE 75 MG PO TABS: 75.0000 mg | ORAL_TABLET | Freq: Every day | ORAL | 3 refills | Status: AC

## 2023-11-09 MED ORDER — STUDY - OCEANIC-STROKE - ASUNDEXIAN 50 MG OR PLACEBO TABLET (PI-SETHI): 1.0000 | ORAL_TABLET | Freq: Every day | ORAL | 0 refills | Status: DC

## 2023-11-10 ENCOUNTER — Encounter (HOSPITAL_COMMUNITY): Payer: Self-pay | Admitting: Licensed Clinical Social Worker

## 2023-11-10 NOTE — Progress Notes (Signed)
 Virtual Visit via Video Note   I connected with Renee Hahn on 11/08/23 2-3pm EDT by a video enabled telemedicine application and verified that I am speaking with the correct person using two identifiers.   Location: Patient: Home Provider: Home Office   I discussed the limitations of evaluation and management by telemedicine and the availability of in person appointments. The patient expressed understanding and agreed to proceed.    THERAPIST PROGRESS NOTE  Session Time: 2-3pm  Participation Level: Active  Behavioral Response: CasualAlertEuthymic  Type of Therapy: Individual Therapy  Treatment Goals addressed: Meet with clinician weekly for therapy to monitor for progress towards goals and address any barriers to success; Reduce depression from average severity level of 6/10 down to a 4/10 in next 6 months by engaging in 1-2 positive coping skills daily as part of developing self-care routine; Reduce average anxiety level from 7/10 down to 5/10 in next 6 months by utilizing 1-2 relaxation skills/grounding skills per day, such as mindful breathing, progressive muscle relaxation, positive visualizations.   ProgressTowards Goals: Progressing   Summary: Renee Hahn is a 53 y.o. female who presents for today's session on time and was alert, oriented x5, with no evidence or self-report of SI/HI or A/V H.  Patient reported ongoing compliance with medication and denied any use of alcohol or illicit substances. Clinician inquired about patient's current emotional ratings, as well as any significant changes in thoughts, feelings or behaviors since previous session. Patient reported scores of 3/10 for depression, 5/10 for anxiety, 0/10 for anger/irritability, and denied any panic attacks.  Pt reports on her current stressors: work, health, family relationships. Pt began a discussion about her childhood. Cln asked open ended questions. Clinician utilized CBT to process concerns about her  health,  and work challenges.     Assessment and plan: Counselor will continue to meet with patient to address treatment plan goals. Patient will continue to follow recommendations of providers and implement skills learned in session.    Suicidal/Homicidal: Nowithout intent/plan   Plan: life with alcoholic, low self esteem   Diagnosis: Anxiety due to a medical condition, Depression  Collaboration of Care: Continue to work with providers  Patient/Guardian was advised Release of Information must be obtained prior to any record release in order to collaborate their care with an outside provider. Patient/Guardian was advised if they have not already done so to contact the registration department to sign all necessary forms in order for us  to release information regarding their care.   Consent: Patient/Guardian gives verbal consent for treatment and assignment of benefits for services provided during this visit. Patient/Guardian expressed understanding and agreed to proceed.   Salahuddin Arismendez S, LCAS 5/6//25

## 2023-11-11 ENCOUNTER — Ambulatory Visit (INDEPENDENT_AMBULATORY_CARE_PROVIDER_SITE_OTHER)

## 2023-11-11 ENCOUNTER — Other Ambulatory Visit: Payer: Self-pay | Admitting: Nurse Practitioner

## 2023-11-11 DIAGNOSIS — Z8673 Personal history of transient ischemic attack (TIA), and cerebral infarction without residual deficits: Secondary | ICD-10-CM | POA: Diagnosis not present

## 2023-11-11 NOTE — Telephone Encounter (Signed)
 Copied from CRM [id number]. Topic: Clinical - Medication Refill >> Nov 11, 2023 10:23 AM Allyne Areola wrote: Medication: ezetimibe  (ZETIA ) 10 MG tablet  Has the patient contacted their pharmacy? Yes, asked patient to contact primary care provider (Agent: If no, request that the patient contact the pharmacy for the refill. If patient does not wish to contact the pharmacy document the reason why and proceed with request.) (Agent: If yes, when and what did the pharmacy advise?)  This is the patient's preferred pharmacy:  [id number] - [place], [place] - [address] AT [facility] [address] [place] [address] Phone: [phone] Fax: [phone]  Is this the correct pharmacy for this prescription? Yes If no, delete pharmacy and type the correct one.   Has the prescription been filled recently? No  Is the patient out of the medication? Yes  Has the patient been seen for an appointment in the last year OR does the patient have an upcoming appointment? Yes  Can we respond through MyChart? Yes  Agent: Please be advised that Rx refills may take up to 3 business days. We ask that you follow-up with your pharmacy.

## 2023-11-14 LAB — CUP PACEART REMOTE DEVICE CHECK
Date Time Interrogation Session: 20250509201618
Implantable Pulse Generator Implant Date: 20250404

## 2023-11-15 ENCOUNTER — Ambulatory Visit (INDEPENDENT_AMBULATORY_CARE_PROVIDER_SITE_OTHER): Admitting: Licensed Clinical Social Worker

## 2023-11-15 ENCOUNTER — Ambulatory Visit: Payer: No Typology Code available for payment source | Admitting: Gastroenterology

## 2023-11-15 DIAGNOSIS — F32A Depression, unspecified: Secondary | ICD-10-CM | POA: Diagnosis not present

## 2023-11-15 DIAGNOSIS — F064 Anxiety disorder due to known physiological condition: Secondary | ICD-10-CM | POA: Diagnosis not present

## 2023-11-15 DIAGNOSIS — F321 Major depressive disorder, single episode, moderate: Secondary | ICD-10-CM

## 2023-11-15 MED ORDER — EZETIMIBE 10 MG PO TABS: 10.0000 mg | ORAL_TABLET | Freq: Every day | ORAL | 1 refills | Status: DC

## 2023-11-18 ENCOUNTER — Other Ambulatory Visit

## 2023-11-18 DIAGNOSIS — D649 Anemia, unspecified: Secondary | ICD-10-CM

## 2023-11-19 LAB — IRON,TIBC AND FERRITIN PANEL
%SAT: 6 % — ABNORMAL LOW (ref 16–45)
Ferritin: 6 ng/mL — ABNORMAL LOW (ref 16–232)
Iron: 25 ug/dL — ABNORMAL LOW (ref 45–160)
TIBC: 396 ug/dL (ref 250–450)

## 2023-11-22 ENCOUNTER — Encounter (HOSPITAL_COMMUNITY): Payer: Self-pay | Admitting: Licensed Clinical Social Worker

## 2023-11-22 ENCOUNTER — Ambulatory Visit (INDEPENDENT_AMBULATORY_CARE_PROVIDER_SITE_OTHER): Admitting: Nurse Practitioner

## 2023-11-22 ENCOUNTER — Ambulatory Visit (INDEPENDENT_AMBULATORY_CARE_PROVIDER_SITE_OTHER): Admitting: Licensed Clinical Social Worker

## 2023-11-22 ENCOUNTER — Encounter: Payer: Self-pay | Admitting: Nurse Practitioner

## 2023-11-22 ENCOUNTER — Ambulatory Visit: Payer: Self-pay | Admitting: Nurse Practitioner

## 2023-11-22 VITALS — BP 135/83 | HR 76 | Temp 97.6°F | Ht 59.0 in | Wt 268.0 lb

## 2023-11-22 DIAGNOSIS — F321 Major depressive disorder, single episode, moderate: Secondary | ICD-10-CM

## 2023-11-22 DIAGNOSIS — F064 Anxiety disorder due to known physiological condition: Secondary | ICD-10-CM | POA: Diagnosis not present

## 2023-11-22 DIAGNOSIS — D500 Iron deficiency anemia secondary to blood loss (chronic): Secondary | ICD-10-CM

## 2023-11-22 MED ORDER — PANTOPRAZOLE SODIUM 20 MG PO TBEC: 20.0000 mg | DELAYED_RELEASE_TABLET | ORAL | 0 refills | Status: DC

## 2023-11-22 NOTE — Progress Notes (Signed)
 Established Patient Visit  Patient: Renee Hahn   DOB: 1970-12-03   53 y.o. Female  MRN: [id number] Visit Date: 11/22/2023  Subjective:     Chief Complaint  Patient presents with   Follow-up    Follow up labs    HPI Anemia S/p hysterectomy Overdue for repeat colonoscopy. Denies any melena or hematochezia Current use of plavix  due to hx of CVA. She discontinue oral iron  supplement 1week ago-unknown reason.  Pending fecal occult results Advised to resume oral iron  supplement. Start protonix 20mg  daily before breakfast Repeat iron  panel in 80month.  Anxiety disorder due to medical condition  improved and stable mood with lexapro  and counseling sessions  Maintain med dose and appts with counselor F/up in 3months  Wt Readings from Last 3 Encounters:  11/22/23 268 lb (121.6 kg)  10/07/23 271 lb (122.9 kg)  09/28/23 270 lb 9.6 oz (122.7 kg)    Reviewed medical, surgical, and social history today  Medications: Outpatient Medications Prior to Visit  Medication Sig   acetaminophen  (TYLENOL ) 650 MG CR tablet Take 650-1,300 mg by mouth every 8 (eight) hours as needed for pain.   atorvastatin  (LIPITOR) 80 MG tablet Take 1 tablet (80 mg total) by mouth daily.   clopidogrel  (PLAVIX ) 75 MG tablet Take 1 tablet (75 mg total) by mouth daily.   escitalopram  (LEXAPRO ) 10 MG tablet TAKE 1 TABLET BY MOUTH EVERY DAY   ezetimibe  (ZETIA ) 10 MG tablet Take 1 tablet (10 mg total) by mouth daily.   Sodium Fluoride (FLUORIDEX DT) Place 1 Application onto teeth 2 (two) times daily.   Study - OCEANIC-STROKE - asundexian 50 mg or placebo tablet (PI-Sethi) Take 1 tablet (50 mg total) by mouth daily. For Investigational Use Only   trolamine salicylate (ASPERCREME) 10 % cream Apply 1 Application topically as needed for muscle pain. *as needed for legs*   No facility-administered medications prior to visit.   Reviewed past medical and social history.   ROS per HPI above       Objective:  BP 135/83 (BP Location: Left Arm, Patient Position: Sitting, Cuff Size: Large) Comment: automatic machine  Pulse 76   Temp 97.6 F (36.4 C) (Temporal)   Ht 4\' 11"  (1.499 m)   Wt 268 lb (121.6 kg)   LMP 11/17/2022 (Approximate)   SpO2 99%   BMI 54.13 kg/m      Physical Exam Vitals and nursing note reviewed.  Cardiovascular:     Rate and Rhythm: Normal rate and regular rhythm.     Pulses: Normal pulses.     Heart sounds: Normal heart sounds.  Pulmonary:     Effort: Pulmonary effort is normal.     Breath sounds: Normal breath sounds.  Abdominal:     General: There is no distension.     Palpations: Abdomen is soft.     Tenderness: There is no abdominal tenderness.  Neurological:     Mental Status: She is alert and oriented to person, place, and time.  Psychiatric:        Mood and Affect: Mood normal.        Behavior: Behavior normal.        Thought Content: Thought content normal.     No results found for any visits on 11/22/23.    Assessment & Plan:    Problem List Items Addressed This Visit     Anemia   S/p hysterectomy Overdue for  repeat colonoscopy. Denies any melena or hematochezia Current use of plavix  due to hx of CVA. She discontinue oral iron  supplement 1week ago-unknown reason.  Pending fecal occult results Advised to resume oral iron  supplement. Start protonix 20mg  daily before breakfast Repeat iron  panel in 65month.      Relevant Medications   pantoprazole (PROTONIX) 20 MG tablet   Other Relevant Orders   Iron , TIBC and Ferritin Panel   Anxiety disorder due to medical condition - Primary    improved and stable mood with lexapro  and counseling sessions  Maintain med dose and appts with counselor F/up in 3months      Return in about 3 months (around 02/22/2024) for HTN, depression and anxiety, hyperlipidemia (fasting).     Kathrene Parents, NP

## 2023-11-22 NOTE — Assessment & Plan Note (Signed)
 improved and stable mood with lexapro  and counseling sessions  Maintain med dose and appts with counselor F/up in 3months

## 2023-11-22 NOTE — Progress Notes (Signed)
 Virtual Visit via Video Note   I connected with Renee Hahn on 11/15/23 1-2pm EDT by a video enabled telemedicine application and verified that I am speaking with the correct person using two identifiers.   Location: Patient: Home Provider: Home Office   I discussed the limitations of evaluation and management by telemedicine and the availability of in person appointments. The patient expressed understanding and agreed to proceed.    THERAPIST PROGRESS NOTE  Session Time: 1-2pm  Participation Level: Active  Behavioral Response: CasualAlertEuthymic  Type of Therapy: Individual Therapy  Treatment Goals addressed: Meet with clinician weekly for therapy to monitor for progress towards goals and address any barriers to success; Reduce depression from average severity level of 6/10 down to a 4/10 in next 6 months by engaging in 1-2 positive coping skills daily as part of developing self-care routine; Reduce average anxiety level from 7/10 down to 5/10 in next 6 months by utilizing 1-2 relaxation skills/grounding skills per day, such as mindful breathing, progressive muscle relaxation, positive visualizations.   ProgressTowards Goals: Progressing   Summary: Renee Hahn is a 53 y.o. female who presents for today's session on time and was alert, oriented x5, with no evidence or self-report of SI/HI or A/V H.  Patient reported ongoing compliance with medication and denied any use of alcohol or illicit substances. Clinician inquired about patient's current emotional ratings, as well as any significant changes in thoughts, feelings or behaviors since previous session. Patient reported scores of 3/10 for depression, 5/10 for anxiety, 0/10 for anger/irritability, and denied any panic attacks.  Pt reports on her current stressors: work, health, family relationships. Pt continued  a discussion about her childhood. Clinician used CBT to help pt process thoughts, feelings, and behaviors.  Clinician discussed coping skills and options for supporting herself through these challenging times.    Assessment and plan: Counselor will continue to meet with patient to address treatment plan goals. Patient will continue to follow recommendations of providers and implement skills learned in session.    Suicidal/Homicidal: Nowithout intent/plan   Plan: life with alcoholic, low self esteem   Diagnosis: Anxiety due to a medical condition, Depression  Collaboration of Care: Continue to work with providers  Patient/Guardian was advised Release of Information must be obtained prior to any record release in order to collaborate their care with an outside provider. Patient/Guardian was advised if they have not already done so to contact the registration department to sign all necessary forms in order for us  to release information regarding their care.   Consent: Patient/Guardian gives verbal consent for treatment and assignment of benefits for services provided during this visit. Patient/Guardian expressed understanding and agreed to proceed.   Jionni Helming S, LCAS 5/13//25

## 2023-11-22 NOTE — Assessment & Plan Note (Signed)
 S/p hysterectomy Overdue for repeat colonoscopy. Denies any melena or hematochezia Current use of plavix  due to hx of CVA. She discontinue oral iron  supplement 1week ago-unknown reason.  Pending fecal occult results Advised to resume oral iron  supplement. Start protonix 20mg  daily before breakfast Repeat iron  panel in 62month.

## 2023-11-22 NOTE — Patient Instructions (Addendum)
 Maintain current med doses Resume oral iron  supplement Start pantoprazole 20mg  before breakfast Schedule lab appointment to repeat iron  panel in 5month.

## 2023-11-29 ENCOUNTER — Ambulatory Visit (INDEPENDENT_AMBULATORY_CARE_PROVIDER_SITE_OTHER): Admitting: Licensed Clinical Social Worker

## 2023-11-29 ENCOUNTER — Encounter (HOSPITAL_COMMUNITY): Payer: Self-pay | Admitting: Licensed Clinical Social Worker

## 2023-11-29 DIAGNOSIS — F064 Anxiety disorder due to known physiological condition: Secondary | ICD-10-CM

## 2023-11-29 DIAGNOSIS — F321 Major depressive disorder, single episode, moderate: Secondary | ICD-10-CM

## 2023-11-29 NOTE — Progress Notes (Signed)
 Virtual Visit via Video Note   I connected with Renee Hahn on 11/22/23 1-2pm EDT by a video enabled telemedicine application and verified that I am speaking with the correct person using two identifiers.   Location: Patient: Home Provider: Home Office   I discussed the limitations of evaluation and management by telemedicine and the availability of in person appointments. The patient expressed understanding and agreed to proceed.    THERAPIST PROGRESS NOTE  Session Time: 1-2pm  Participation Level: Active  Behavioral Response: CasualAlertEuthymic  Type of Therapy: Individual Therapy  Treatment Goals addressed: Meet with clinician weekly for therapy to monitor for progress towards goals and address any barriers to success; Reduce depression from average severity level of 6/10 down to a 4/10 in next 6 months by engaging in 1-2 positive coping skills daily as part of developing self-care routine; Reduce average anxiety level from 7/10 down to 5/10 in next 6 months by utilizing 1-2 relaxation skills/grounding skills per day, such as mindful breathing, progressive muscle relaxation, positive visualizations.   ProgressTowards Goals: Progressing   Summary: Renee Hahn is a 52 y.o. female who presents for today's session on time and was alert, oriented x5, with no evidence or self-report of SI/HI or A/V H.  Patient reported ongoing compliance with medication and denied any use of alcohol or illicit substances. Clinician inquired about patient's current emotional ratings, as well as any significant changes in thoughts, feelings or behaviors since previous session. Patient reported scores of 3/10 for depression, 5/10 for anxiety, 0/10 for anger/irritability, and denied any panic attacks.  Pt reports on her current stressors: work, health, family relationships. Pt continued  a discussion about her childhood and how its affecting her now.. Clinician utilized MI OARS to reflect and  summarize thoughts and feelings about this new normal life she is experiencing.   Assessment and plan: Counselor will continue to meet with patient to address treatment plan goals. Patient will continue to follow recommendations of providers and implement skills learned in session.    Suicidal/Homicidal: Nowithout intent/plan   Plan: life with alcoholic, low self esteem   Diagnosis: Anxiety due to a medical condition, Depression  Collaboration of Care: Continue to work with providers  Patient/Guardian was advised Release of Information must be obtained prior to any record release in order to collaborate their care with an outside provider. Patient/Guardian was advised if they have not already done so to contact the registration department to sign all necessary forms in order for us  to release information regarding their care.   Consent: Patient/Guardian gives verbal consent for treatment and assignment of benefits for services provided during this visit. Patient/Guardian expressed understanding and agreed to proceed.   Sharel Behne S, LCAS 5/20//25

## 2023-11-30 ENCOUNTER — Encounter (HOSPITAL_COMMUNITY): Payer: Self-pay | Admitting: Licensed Clinical Social Worker

## 2023-11-30 ENCOUNTER — Ambulatory Visit: Payer: No Typology Code available for payment source | Admitting: Nurse Practitioner

## 2023-11-30 ENCOUNTER — Ambulatory Visit: Payer: Self-pay | Admitting: Cardiovascular Disease

## 2023-11-30 NOTE — Progress Notes (Signed)
 Virtual Visit via Video Note   I connected with Renee Hahn on 11/29/23 1-2pm EDT by a video enabled telemedicine application and verified that I am speaking with the correct person using two identifiers.   Location: Patient: Home Provider: Home Office   I discussed the limitations of evaluation and management by telemedicine and the availability of in person appointments. The patient expressed understanding and agreed to proceed.    THERAPIST PROGRESS NOTE  Session Time: 1-2pm  Participation Level: Active  Behavioral Response: CasualAlertEuthymic  Type of Therapy: Individual Therapy  Treatment Goals addressed: Meet with clinician weekly for therapy to monitor for progress towards goals and address any barriers to success; Reduce depression from average severity level of 6/10 down to a 4/10 in next 6 months by engaging in 1-2 positive coping skills daily as part of developing self-care routine; Reduce average anxiety level from 7/10 down to 5/10 in next 6 months by utilizing 1-2 relaxation skills/grounding skills per day, such as mindful breathing, progressive muscle relaxation, positive visualizations.   ProgressTowards Goals: Progressing   Summary: Renee Hahn is a 53 y.o. female who presents for today's session on time and was alert, oriented x5, with no evidence or self-report of SI/HI or A/V H.  Patient reported ongoing compliance with medication and denied any use of alcohol or illicit substances. Clinician inquired about patient's current emotional ratings, as well as any significant changes in thoughts, feelings or behaviors since previous session. Patient reported scores of 3/10 for depression, 5/10 for anxiety, 0/10 for anger/irritability, and denied any panic attacks. Pt reports on her coping skills that are working.  Pt reports on her current stressors: work, health, family relationships. Pt continued  a discussion about her childhood and how its affecting her  currently in communication, relationships.Cln and pt began discussing her low self-esteem, how she got low self esteem and how to move forward in boosting her self esteem. Cln provided psychoeducation on boosting self esteem.  .   Assessment and plan: Counselor will continue to meet with patient to address treatment plan goals. Patient will continue to follow recommendations of providers and implement skills learned in session.    Suicidal/Homicidal: Nowithout intent/plan   Plan: life with alcoholic, low self esteem   Diagnosis: Anxiety due to a medical condition, Depression  Collaboration of Care: Continue to work with providers  Patient/Guardian was advised Release of Information must be obtained prior to any record release in order to collaborate their care with an outside provider. Patient/Guardian was advised if they have not already done so to contact the registration department to sign all necessary forms in order for us  to release information regarding their care.   Consent: Patient/Guardian gives verbal consent for treatment and assignment of benefits for services provided during this visit. Patient/Guardian expressed understanding and agreed to proceed.   Deunte Bledsoe S, LCAS 11/29/23

## 2023-12-06 ENCOUNTER — Ambulatory Visit (INDEPENDENT_AMBULATORY_CARE_PROVIDER_SITE_OTHER): Admitting: Licensed Clinical Social Worker

## 2023-12-06 ENCOUNTER — Encounter (HOSPITAL_COMMUNITY): Payer: Self-pay | Admitting: Licensed Clinical Social Worker

## 2023-12-06 DIAGNOSIS — F321 Major depressive disorder, single episode, moderate: Secondary | ICD-10-CM | POA: Diagnosis not present

## 2023-12-06 DIAGNOSIS — F064 Anxiety disorder due to known physiological condition: Secondary | ICD-10-CM

## 2023-12-06 DIAGNOSIS — F418 Other specified anxiety disorders: Secondary | ICD-10-CM

## 2023-12-06 NOTE — Progress Notes (Signed)
 Virtual Visit via Video Note   I connected with [name] on 12/06/23 1-2pm EDT by a video enabled telemedicine application and verified that I am speaking with the correct person using two identifiers.   Location: Patient: Home Provider: Home Office   I discussed the limitations of evaluation and management by telemedicine and the availability of in person appointments. The patient expressed understanding and agreed to proceed.    THERAPIST PROGRESS NOTE  Session Time: 1-2pm  Participation Level: Active  Behavioral Response: CasualAlertEuthymic  Type of Therapy: Individual Therapy  Treatment Goals addressed: Meet with clinician weekly for therapy to monitor for progress towards goals and address any barriers to success; Reduce depression from average severity level of 6/10 down to a 4/10 in next 6 months by engaging in 1-2 positive coping skills daily as part of developing self-care routine; Reduce average anxiety level from 7/10 down to 5/10 in next 6 months by utilizing 1-2 relaxation skills/grounding skills per day, such as mindful breathing, progressive muscle relaxation, positive visualizations.   ProgressTowards Goals: Progressing   Summary: Renee Hahn is a 53 y.o. female who presents for today'Hahn session on time and was alert, oriented x5, with no evidence or self-report of SI/HI or A/V H.  Patient reported ongoing compliance with medication and denied any use of alcohol or illicit substances. Clinician inquired about patient'Hahn current emotional ratings, as well as any significant changes in thoughts, feelings or behaviors since previous session. Patient reported scores of 3/10 for depression, 5/10 for anxiety, 2/10 for anger/irritability, and denied any panic attacks. Pt reports on her coping skills that are working.  Pt reports on her current stressors: work, health, family relationships. Pt continued  a discussion about her childhood and how its affecting her  currently in communication, relationships.Cln and pt continued discussing her low self-esteem, how she got low self esteem and how to move forward in boosting her self esteem. Again, cln provided psychoeducation on boosting self esteem.  .   Assessment and plan: Counselor will continue to meet with patient to address treatment plan goals. Patient will continue to follow recommendations of providers and implement skills learned in session.    Suicidal/Homicidal: Nowithout intent/plan   Plan: life with alcoholic, low self esteem   Diagnosis: Anxiety due to a medical condition, Depression  Collaboration of Care: Continue to work with providers  Patient/Guardian was advised Release of Information must be obtained prior to any record release in order to collaborate their care with an outside provider. Patient/Guardian was advised if they have not already done so to contact the registration department to sign all necessary forms in order for us  to release information regarding their care.   Consent: Patient/Guardian gives verbal consent for treatment and assignment of benefits for services provided during this visit. Patient/Guardian expressed understanding and agreed to proceed.   Renee Hahn, LCAS 12/06/23

## 2023-12-13 ENCOUNTER — Ambulatory Visit (INDEPENDENT_AMBULATORY_CARE_PROVIDER_SITE_OTHER): Admitting: Licensed Clinical Social Worker

## 2023-12-13 DIAGNOSIS — F321 Major depressive disorder, single episode, moderate: Secondary | ICD-10-CM | POA: Diagnosis not present

## 2023-12-13 DIAGNOSIS — F064 Anxiety disorder due to known physiological condition: Secondary | ICD-10-CM

## 2023-12-13 LAB — CUP PACEART REMOTE DEVICE CHECK
Date Time Interrogation Session: 20250609201811
Implantable Pulse Generator Implant Date: 20250404

## 2023-12-15 ENCOUNTER — Encounter (HOSPITAL_COMMUNITY): Payer: Self-pay | Admitting: Licensed Clinical Social Worker

## 2023-12-15 NOTE — Progress Notes (Signed)
 Virtual Visit via Video Note   I connected with [name] on 12/13/23 1-2pm EDT by a video enabled telemedicine application and verified that I am speaking with the correct person using two identifiers.   Location: Patient: Home Provider: Home Office   I discussed the limitations of evaluation and management by telemedicine and the availability of in person appointments. The patient expressed understanding and agreed to proceed.    THERAPIST PROGRESS NOTE  Session Time: 1-2pm  Participation Level: Active  Behavioral Response: CasualAlertEuthymic  Type of Therapy: Individual Therapy  Treatment Goals addressed: Meet with clinician weekly for therapy to monitor for progress towards goals and address any barriers to success; Reduce depression from average severity level of 6/10 down to a 4/10 in next 6 months by engaging in 1-2 positive coping skills daily as part of developing self-care routine; Reduce average anxiety level from 7/10 down to 5/10 in next 6 months by utilizing 1-2 relaxation skills/grounding skills per day, such as mindful breathing, progressive muscle relaxation, positive visualizations.   ProgressTowards Goals: Progressing   Summary: Renee Hahn is a 53 y.o. female who presents for today's session on time and was alert, oriented x5, with no evidence or self-report of SI/HI or A/V H.  Patient reported ongoing compliance with medication and denied any use of alcohol or illicit substances. Clinician inquired about patient's current emotional ratings, as well as any significant changes in thoughts, feelings or behaviors since previous session. Patient reported scores of 3/10 for depression, 5/10 for anxiety, 2/10 for anger/irritability, and denied any panic attacks. Pt reports on her coping skills that are working.  Pt reports on her current stressors: work, health, family relationships. Pt talked about her marriage which affected her self esteem. Cln sent pt some  information on increasing self esteem, reviewing it in session.      .   Assessment and plan: Counselor will continue to meet with patient to address treatment plan goals. Patient will continue to follow recommendations of providers and implement skills learned in session.    Suicidal/Homicidal: Nowithout intent/plan   Plan: life with alcoholic, low self esteem   Diagnosis: Anxiety due to a medical condition, Depression  Collaboration of Care: Continue to work with providers  Patient/Guardian was advised Release of Information must be obtained prior to any record release in order to collaborate their care with an outside provider. Patient/Guardian was advised if they have not already done so to contact the registration department to sign all necessary forms in order for us  to release information regarding their care.   Consent: Patient/Guardian gives verbal consent for treatment and assignment of benefits for services provided during this visit. Patient/Guardian expressed understanding and agreed to proceed.   Juanito Gonyer S, LCAS 12/13/23

## 2023-12-16 ENCOUNTER — Ambulatory Visit (INDEPENDENT_AMBULATORY_CARE_PROVIDER_SITE_OTHER)

## 2023-12-16 DIAGNOSIS — Z8673 Personal history of transient ischemic attack (TIA), and cerebral infarction without residual deficits: Secondary | ICD-10-CM

## 2023-12-16 NOTE — Progress Notes (Signed)
 [facility] Summary Report / Loop Recorder

## 2023-12-16 NOTE — Addendum Note (Signed)
 Addended by: Lott Rouleau A on: 12/16/2023 10:48 AM   Modules accepted: Orders

## 2023-12-20 ENCOUNTER — Ambulatory Visit (INDEPENDENT_AMBULATORY_CARE_PROVIDER_SITE_OTHER): Admitting: Licensed Clinical Social Worker

## 2023-12-20 DIAGNOSIS — F321 Major depressive disorder, single episode, moderate: Secondary | ICD-10-CM | POA: Diagnosis not present

## 2023-12-20 DIAGNOSIS — F064 Anxiety disorder due to known physiological condition: Secondary | ICD-10-CM

## 2023-12-21 ENCOUNTER — Encounter (HOSPITAL_COMMUNITY): Payer: Self-pay | Admitting: Licensed Clinical Social Worker

## 2023-12-21 NOTE — Progress Notes (Signed)
 Virtual Visit via Video Note   I connected with Renee Hahn on 12/20/23 1-2pm EDT by a video enabled telemedicine application and verified that I am speaking with the correct person using two identifiers.   Location: Patient: Home Provider: Home Office   I discussed the limitations of evaluation and management by telemedicine and the availability of in person appointments. The patient expressed understanding and agreed to proceed.    THERAPIST PROGRESS NOTE  Session Time: 1-2pm  Participation Level: Active  Behavioral Response: CasualAlertEuthymic  Type of Therapy: Individual Therapy  Treatment Goals addressed: Meet with clinician weekly for therapy to monitor for progress towards goals and address any barriers to success; Reduce depression from average severity level of 6/10 down to a 4/10 in next 6 months by engaging in 1-2 positive coping skills daily as part of developing self-care routine; Reduce average anxiety level from 7/10 down to 5/10 in next 6 months by utilizing 1-2 relaxation skills/grounding skills per day, such as mindful breathing, progressive muscle relaxation, positive visualizations.   ProgressTowards Goals: Progressing   Summary: Renee Hahn is a 53 y.o. female who presents for today's session on time and was alert, oriented x5, with no evidence or self-report of SI/HI or A/V H.  Patient reported ongoing compliance with medication and denied any use of alcohol or illicit substances. Clinician inquired about patient's current emotional ratings, as well as any significant changes in thoughts, feelings or behaviors since previous session. Patient reported scores of 3/10 for depression, 5/10 for anxiety, 2/10 for anger/irritability, and denied any panic attacks. Pt reports on her coping skills that are working.  Pt reports on her current stressors: work, health, family relationships. Pt talked about her childhood and feeling sheltered by her parents. Cln and pt  reviewed the effects on her life ( low self esteem, sheltered from negative aspects of life: naive. Clinician explored triggers and coping skills in order to assist in feeling better emotions regulationr. Clinician processed triggers and their effects on her mood and behaviors     .   Assessment and plan: Counselor will continue to meet with patient to address treatment plan goals. Patient will continue to follow recommendations of providers and implement skills learned in session.    Suicidal/Homicidal: Nowithout intent/plan   Plan: life with alcoholic, low self esteem   Diagnosis: Anxiety due to a medical condition, Depression  Collaboration of Care: Continue to work with providers  Patient/Guardian was advised Release of Information must be obtained prior to any record release in order to collaborate their care with an outside provider. Patient/Guardian was advised if they have not already done so to contact the registration department to sign all necessary forms in order for us  to release information regarding their care.   Consent: Patient/Guardian gives verbal consent for treatment and assignment of benefits for services provided during this visit. Patient/Guardian expressed understanding and agreed to proceed.   Hedda Crumbley S, LCAS 12/20/23

## 2023-12-23 ENCOUNTER — Other Ambulatory Visit

## 2023-12-27 ENCOUNTER — Ambulatory Visit (INDEPENDENT_AMBULATORY_CARE_PROVIDER_SITE_OTHER): Admitting: Licensed Clinical Social Worker

## 2023-12-27 DIAGNOSIS — F32A Depression, unspecified: Secondary | ICD-10-CM

## 2023-12-27 DIAGNOSIS — F064 Anxiety disorder due to known physiological condition: Secondary | ICD-10-CM | POA: Diagnosis not present

## 2023-12-27 DIAGNOSIS — F321 Major depressive disorder, single episode, moderate: Secondary | ICD-10-CM

## 2023-12-30 ENCOUNTER — Ambulatory Visit: Payer: Self-pay | Admitting: Cardiology

## 2023-12-30 ENCOUNTER — Encounter (HOSPITAL_COMMUNITY): Payer: Self-pay | Admitting: Licensed Clinical Social Worker

## 2023-12-30 NOTE — Progress Notes (Signed)
 Virtual Visit via Video Note   I connected with Renee Hahn on 12/27/23 1-2pm EDT by a video enabled telemedicine application and verified that I am speaking with the correct person using two identifiers.   Location: Patient: Home Provider: Home Office   I discussed the limitations of evaluation and management by telemedicine and the availability of in person appointments. The patient expressed understanding and agreed to proceed.    THERAPIST PROGRESS NOTE  Session Time: 1-2pm  Participation Level: Active  Behavioral Response: CasualAlertEuthymic  Type of Therapy: Individual Therapy  Treatment Goals addressed: Meet with clinician weekly for therapy to monitor for progress towards goals and address any barriers to success; Reduce depression from average severity level of 6/10 down to a 4/10 in next 6 months by engaging in 1-2 positive coping skills daily as part of developing self-care routine; Reduce average anxiety level from 7/10 down to 5/10 in next 6 months by utilizing 1-2 relaxation skills/grounding skills per day, such as mindful breathing, progressive muscle relaxation, positive visualizations.   ProgressTowards Goals: Progressing   Summary: Renee Hahn is a 53 y.o. female who presents for today's session on time and was alert, oriented x5, with no evidence or self-report of SI/HI or A/V H.  Patient reported ongoing compliance with medication and denied any use of alcohol or illicit substances. Clinician inquired about patient's current emotional ratings, as well as any significant changes in thoughts, feelings or behaviors since previous session. Patient reported scores of 3/10 for depression, 5/10 for anxiety, 2/10 for anger/irritability, and denied any panic attacks. Pt reports on her coping skills that she finds effective.  Pt reports on her current stressors: work, health, family relationships. Pt talked about her childhood and feeling sheltered by her parents. Cln  and pt reviewed the effects on her life ( low self esteem, sheltered from negative aspects of life: naive. Cln provided psychoeducation on co-dependent relationships and walking on egg shells. Cln recommended 2 books for her to read to assist her in moving forward from previous toxic relationships.    .   Assessment and plan: Counselor will continue to meet with patient to address treatment plan goals. Patient will continue to follow recommendations of providers and implement skills learned in session.    Suicidal/Homicidal: Nowithout intent/plan   Plan: life with alcoholic, low self esteem   Diagnosis: Anxiety due to a medical condition, Depression  Collaboration of Care: Continue to work with providers  Patient/Guardian was advised Release of Information must be obtained prior to any record release in order to collaborate their care with an outside provider. Patient/Guardian was advised if they have not already done so to contact the registration department to sign all necessary forms in order for us  to release information regarding their care.   Consent: Patient/Guardian gives verbal consent for treatment and assignment of benefits for services provided during this visit. Patient/Guardian expressed understanding and agreed to proceed.   Renee Hahn S, LCAS 12/27/23

## 2024-01-02 ENCOUNTER — Other Ambulatory Visit (INDEPENDENT_AMBULATORY_CARE_PROVIDER_SITE_OTHER)

## 2024-01-02 DIAGNOSIS — D500 Iron deficiency anemia secondary to blood loss (chronic): Secondary | ICD-10-CM

## 2024-01-03 ENCOUNTER — Encounter (HOSPITAL_COMMUNITY): Payer: Self-pay | Admitting: Licensed Clinical Social Worker

## 2024-01-03 ENCOUNTER — Ambulatory Visit (INDEPENDENT_AMBULATORY_CARE_PROVIDER_SITE_OTHER): Admitting: Licensed Clinical Social Worker

## 2024-01-03 ENCOUNTER — Ambulatory Visit: Payer: Self-pay | Admitting: Nurse Practitioner

## 2024-01-03 ENCOUNTER — Other Ambulatory Visit: Payer: Self-pay | Admitting: Nurse Practitioner

## 2024-01-03 DIAGNOSIS — F32A Depression, unspecified: Secondary | ICD-10-CM | POA: Diagnosis not present

## 2024-01-03 DIAGNOSIS — F064 Anxiety disorder due to known physiological condition: Secondary | ICD-10-CM | POA: Diagnosis not present

## 2024-01-03 DIAGNOSIS — F321 Major depressive disorder, single episode, moderate: Secondary | ICD-10-CM

## 2024-01-03 DIAGNOSIS — D500 Iron deficiency anemia secondary to blood loss (chronic): Secondary | ICD-10-CM

## 2024-01-03 LAB — IRON,TIBC AND FERRITIN PANEL
%SAT: 10 % — ABNORMAL LOW (ref 16–45)
Ferritin: 12 ng/mL — ABNORMAL LOW (ref 16–232)
Iron: 35 ug/dL — ABNORMAL LOW (ref 45–160)
TIBC: 360 ug/dL (ref 250–450)

## 2024-01-03 NOTE — Progress Notes (Signed)
 Virtual Visit via Video Note   I connected with Renee Hahn on 01/03/24 2-3pm EDT by a video enabled telemedicine application and verified that I am speaking with the correct person using two identifiers.   Location: Patient: Home Provider: Home Office   I discussed the limitations of evaluation and management by telemedicine and the availability of in person appointments. The patient expressed understanding and agreed to proceed.    THERAPIST PROGRESS NOTE  Session Time: 2-3pm  Participation Level: Active  Behavioral Response: CasualAlertEuthymic  Type of Therapy: Individual Therapy  Treatment Goals addressed: Meet with clinician weekly for therapy to monitor for progress towards goals and address any barriers to success; Reduce depression from average severity level of 6/10 down to a 4/10 in next 6 months by engaging in 1-2 positive coping skills daily as part of developing self-care routine; Reduce average anxiety level from 7/10 down to 5/10 in next 6 months by utilizing 1-2 relaxation skills/grounding skills per day, such as mindful breathing, progressive muscle relaxation, positive visualizations.   ProgressTowards Goals: Progressing   Summary: Renee Hahn is a 53 y.o. female who presents for today's session on time and was alert, oriented x5, with no evidence or self-report of SI/HI or A/V H.  Patient reported ongoing compliance with medication and denied any use of alcohol or illicit substances. Clinician inquired about patient's current emotional ratings, as well as any significant changes in thoughts, feelings or behaviors since previous session. Patient reported scores of 3/10 for depression, 610 for anxiety, 2/10 for anger/irritability, and denied any panic attacks. Pt reports on her coping skills that she finds effective.  Pt reports on her current stressors: work, health, family relationships. Cln and pt reviewed the effects on her life ( low self esteem, sheltered  from negative aspects of life: Pt and cln continued discussing co-dependent relationship with her husband who was in active addiction during their marriage. Cln asked open ended questions and used CBT to assist pt in problem solving her past co-dependant relationship. Cln and pt discussed the 2 books recommended for her to read to assist her in moving forward from previous toxic relationships. I'm trying to decide if I want audible or hard cover books. Cln suggested hard cover due to using as a reference book and ability to take notes.      .   Assessment and plan: Counselor will continue to meet with patient to address treatment plan goals. Patient will continue to follow recommendations of providers and implement skills learned in session.    Suicidal/Homicidal: Nowithout intent/plan   Plan: life with alcoholic, low self esteem   Diagnosis: Anxiety due to a medical condition, Depression  Collaboration of Care: Continue to work with providers  Patient/Guardian was advised Release of Information must be obtained prior to any record release in order to collaborate their care with an outside provider. Patient/Guardian was advised if they have not already done so to contact the registration department to sign all necessary forms in order for us  to release information regarding their care.   Consent: Patient/Guardian gives verbal consent for treatment and assignment of benefits for services provided during this visit. Patient/Guardian expressed understanding and agreed to proceed.   Calab Sachse S, LCAS 01/03/24

## 2024-01-10 ENCOUNTER — Ambulatory Visit (HOSPITAL_COMMUNITY): Admitting: Licensed Clinical Social Worker

## 2024-01-10 DIAGNOSIS — F064 Anxiety disorder due to known physiological condition: Secondary | ICD-10-CM | POA: Diagnosis not present

## 2024-01-10 DIAGNOSIS — F321 Major depressive disorder, single episode, moderate: Secondary | ICD-10-CM

## 2024-01-10 DIAGNOSIS — F32A Depression, unspecified: Secondary | ICD-10-CM | POA: Diagnosis not present

## 2024-01-12 ENCOUNTER — Encounter (HOSPITAL_COMMUNITY): Payer: Self-pay | Admitting: Licensed Clinical Social Worker

## 2024-01-12 NOTE — Progress Notes (Signed)
 Virtual Visit via Video Note   I connected with Renee Hahn on 01/10/24 1-2pm EDT by a video enabled telemedicine application and verified that I am speaking with the correct person using two identifiers.   Location: Patient: Home Provider: Home Office   I discussed the limitations of evaluation and management by telemedicine and the availability of in person appointments. The patient expressed understanding and agreed to proceed.    THERAPIST PROGRESS NOTE  Session Time: 1-2pm  Participation Level: Active  Behavioral Response: CasualAlertEuthymic  Type of Therapy: Individual Therapy  Treatment Goals addressed: Meet with clinician weekly for therapy to monitor for progress towards goals and address any barriers to success; Reduce depression from average severity level of 6/10 down to a 4/10 in next 6 months by engaging in 1-2 positive coping skills daily as part of developing self-care routine; Reduce average anxiety level from 7/10 down to 5/10 in next 6 months by utilizing 1-2 relaxation skills/grounding skills per day, such as mindful breathing, progressive muscle relaxation, positive visualizations.   ProgressTowards Goals: Progressing   Summary: Renee Hahn is a 53 y.o. female who presents for today's session on time and was alert, oriented x5, with no evidence or self-report of SI/HI or A/V H.  Patient reported ongoing compliance with medication and denied any use of alcohol or illicit substances. Clinician inquired about patient's current emotional ratings, as well as any significant changes in thoughts, feelings or behaviors since previous session. Patient reported scores of 3/10 for depression, 610 for anxiety, 2/10 for anger/irritability, and denied any panic attacks. Pt reports on her coping skills that she finds effective.  Pt reports on her current stressors: work, health, family relationships. Cln and pt reviewed the effects on her life ( low self esteem, sheltered  from negative aspects of life: Pt and cln continued discussing co-dependent relationship with her husband who was in active addiction during their marriage.   Again, cln asked open ended questions and used CBT to assist pt in problem solving her past co-dependant relationship.Again, cln and pt discussed the 2 books recommended for her to read to assist her in moving forward from previous toxic relationships and to have more healthy relationships in the future.    .   Assessment and plan: Counselor will continue to meet with patient to address treatment plan goals. Patient will continue to follow recommendations of providers and implement skills learned in session.    Suicidal/Homicidal: Nowithout intent/plan   Plan: life with alcoholic, low self esteem   Diagnosis: Anxiety due to a medical condition, Depression  Collaboration of Care: Continue to work with providers  Patient/Guardian was advised Release of Information must be obtained prior to any record release in order to collaborate their care with an outside provider. Patient/Guardian was advised if they have not already done so to contact the registration department to sign all necessary forms in order for us  to release information regarding their care.   Consent: Patient/Guardian gives verbal consent for treatment and assignment of benefits for services provided during this visit. Patient/Guardian expressed understanding and agreed to proceed.   Carols Clemence S, LCAS 01/10/24

## 2024-01-16 ENCOUNTER — Ambulatory Visit

## 2024-01-16 DIAGNOSIS — Z8673 Personal history of transient ischemic attack (TIA), and cerebral infarction without residual deficits: Secondary | ICD-10-CM | POA: Diagnosis not present

## 2024-01-16 LAB — CUP PACEART REMOTE DEVICE CHECK
Date Time Interrogation Session: 20250714005445
Implantable Pulse Generator Implant Date: 20250404

## 2024-01-17 ENCOUNTER — Ambulatory Visit (HOSPITAL_COMMUNITY): Admitting: Licensed Clinical Social Worker

## 2024-01-17 DIAGNOSIS — F064 Anxiety disorder due to known physiological condition: Secondary | ICD-10-CM | POA: Diagnosis not present

## 2024-01-17 DIAGNOSIS — F32A Depression, unspecified: Secondary | ICD-10-CM | POA: Diagnosis not present

## 2024-01-17 DIAGNOSIS — F321 Major depressive disorder, single episode, moderate: Secondary | ICD-10-CM

## 2024-01-19 ENCOUNTER — Encounter (HOSPITAL_COMMUNITY): Payer: Self-pay | Admitting: Licensed Clinical Social Worker

## 2024-01-19 NOTE — Progress Notes (Signed)
 Virtual Visit via Video Note   I connected with [name] on 01/17/24 1-2pm EDT by a video enabled telemedicine application and verified that I am speaking with the correct person using two identifiers.   Location: Patient: Home Provider: Home Office   I discussed the limitations of evaluation and management by telemedicine and the availability of in person appointments. The patient expressed understanding and agreed to proceed.    THERAPIST PROGRESS NOTE  Session Time: 1-2pm  Participation Level: Active  Behavioral Response: CasualAlertEuthymic  Type of Therapy: Individual Therapy  Treatment Goals addressed: Meet with clinician weekly for therapy to monitor for progress towards goals and address any barriers to success; Reduce depression from average severity level of 6/10 down to a 4/10 in next 6 months by engaging in 1-2 positive coping skills daily as part of developing self-care routine; Reduce average anxiety level from 7/10 down to 5/10 in next 6 months by utilizing 1-2 relaxation skills/grounding skills per day, such as mindful breathing, progressive muscle relaxation, positive visualizations.   ProgressTowards Goals: Progressing   Summary: Renee Hahn is a 53 y.o. female who presents for today's session on time and was alert, oriented x5, with no evidence or self-report of SI/HI or A/V H.  Patient reported ongoing compliance with medication and denied any use of alcohol or illicit substances. Clinician inquired about patient's current emotional ratings, as well as any significant changes in thoughts, feelings or behaviors since previous session. Patient reported scores of 3/10 for depression, 610 for anxiety, 2/10 for anger/irritability, and denied any panic attacks. Pt reports on her coping skills that she finds effective.  Pt reports on her current stressors: work, health, family relationships. Cln and pt reviewed the effects on her life ( low self esteem, sheltered  from negative aspects of life: Pt and cln continued discussing co-dependent relationship with her husband who was in active addiction during their marriage. Cln emailed pt co-dependency worksheets and reviewed with pt.   .   Assessment and plan: Counselor will continue to meet with patient to address treatment plan goals. Patient will continue to follow recommendations of providers and implement skills learned in session.    Suicidal/Homicidal: Nowithout intent/plan   Plan: life with alcoholic, low self esteem, co-dependency  Diagnosis: Anxiety due to a medical condition, Depression  Collaboration of Care: Continue to work with providers  Patient/Guardian was advised Release of Information must be obtained prior to any record release in order to collaborate their care with an outside provider. Patient/Guardian was advised if they have not already done so to contact the registration department to sign all necessary forms in order for us  to release information regarding their care.   Consent: Patient/Guardian gives verbal consent for treatment and assignment of benefits for services provided during this visit. Patient/Guardian expressed understanding and agreed to proceed.   Wylodean Shimmel S, LCAS 01/17/24

## 2024-01-20 ENCOUNTER — Encounter

## 2024-01-23 ENCOUNTER — Ambulatory Visit: Payer: No Typology Code available for payment source | Admitting: Nurse Practitioner

## 2024-01-24 ENCOUNTER — Ambulatory Visit (INDEPENDENT_AMBULATORY_CARE_PROVIDER_SITE_OTHER): Admitting: Licensed Clinical Social Worker

## 2024-01-24 DIAGNOSIS — F321 Major depressive disorder, single episode, moderate: Secondary | ICD-10-CM

## 2024-01-24 DIAGNOSIS — F064 Anxiety disorder due to known physiological condition: Secondary | ICD-10-CM | POA: Diagnosis not present

## 2024-01-29 ENCOUNTER — Encounter (HOSPITAL_COMMUNITY): Payer: Self-pay | Admitting: Licensed Clinical Social Worker

## 2024-01-29 NOTE — Progress Notes (Unsigned)
 Virtual Visit via Video Note   I connected with [name] on [id number]                                                                                                                                                                                                                                                                                                                                                                                                                                                                                                                                                                                                                                                                                                                                                                                                   /  25 1-2pm EDT by a video enabled telemedicine application and verified that I am speaking with the correct person using two identifiers.   Location: Patient: Home Provider: Home Office   I discussed the limitations of evaluation and management by telemedicine and the availability of in person appointments. The patient expressed understanding and agreed to proceed.    THERAPIST PROGRESS NOTE  Session Time: 1-2pm  Participation Level: Active  Behavioral Response: CasualAlertEuthymic  Type of Therapy: Individual Therapy  Treatment Goals addressed: Meet with clinician weekly for therapy to monitor for progress towards goals and address any barriers to success; Reduce depression from average severity level of 6/10 down to a 4/10 in next 6 months by engaging in 1-2 positive coping skills daily as part of developing self-care routine; Reduce average anxiety level from 7/10 down to 5/10 in next 6 months by utilizing 1-2 relaxation skills/grounding skills per day, such as mindful breathing, progressive muscle relaxation, positive visualizations.   ProgressTowards  Goals: Progressing   Summary: Renee Hahn is a 53 y.o. female who presents for today's session on time and was alert, oriented x5, with no evidence or self-report of SI/HI or A/V H.  Patient reported ongoing compliance with medication and denied any use of alcohol or illicit substances. Clinician inquired about patient's current emotional ratings, as well as any significant changes in thoughts, feelings or behaviors since previous session. Patient reported scores of 3/10 for depression, 610 for anxiety, 2/10 for anger/irritability, and denied any panic attacks. Pt reports on her coping skills that she finds effective.  Pt reports on her current stressors: work, health, family relationships. Cln and pt reviewed the effects on her life ( low self esteem, sheltered from negative aspects of life: Pt and cln continued discussing co-dependent relationship with her husband who was in active addiction during their marriage. Cln emailed pt co-dependency worksheets and reviewed with pt.   .   Assessment and plan: Counselor will continue to meet with patient to address treatment plan goals. Patient will continue to follow recommendations of providers and implement skills learned in session.    Suicidal/Homicidal: Nowithout intent/plan   Plan: life with alcoholic, low self esteem, co-dependency  Diagnosis: Anxiety due to a medical condition, Depression  Collaboration of Care: Continue to work with providers  Patient/Guardian was advised Release of Information must be obtained prior to any record release in order to collaborate their care with an outside provider. Patient/Guardian was advised if they have not already done so to contact the registration department to sign all necessary forms in order for us  to release information regarding their care.   Consent: Patient/Guardian gives verbal consent for treatment and assignment of benefits for services provided during this visit. Patient/Guardian  expressed understanding and agreed to proceed.   Caio Devera S, LCAS 01/24/24

## 2024-01-31 ENCOUNTER — Ambulatory Visit (INDEPENDENT_AMBULATORY_CARE_PROVIDER_SITE_OTHER): Admitting: Licensed Clinical Social Worker

## 2024-01-31 DIAGNOSIS — F064 Anxiety disorder due to known physiological condition: Secondary | ICD-10-CM

## 2024-01-31 DIAGNOSIS — F32A Depression, unspecified: Secondary | ICD-10-CM

## 2024-01-31 DIAGNOSIS — F321 Major depressive disorder, single episode, moderate: Secondary | ICD-10-CM

## 2024-02-01 ENCOUNTER — Encounter (HOSPITAL_COMMUNITY): Payer: Self-pay | Admitting: Licensed Clinical Social Worker

## 2024-02-01 NOTE — Progress Notes (Addendum)
 Virtual Visit via Video Note   I  Virtual Visit via Video Note   I connected with [name] on 01/31/24  1-2pm EDT by a video enabled telemedicine application and verified that I am speaking with the correct person using two identifiers.   Location: Patient: Home Provider: Home Office   I discussed the limitations of evaluation and management by telemedicine and the availability of in person  appointments. The patient expressed understanding and agreed to proceed.    THERAPIST PROGRESS NOTE  Session Time: 1-2pm  Participation Level: Active  Behavioral Response: CasualAlertEuthymic  Type of Therapy: Individual Therapy  Treatment Goals addressed: Meet with clinician weekly for therapy to monitor for progress towards goals and address any barriers to success; Reduce depression from average severity level of 6/10 down to a 4/10 in next 6 months by engaging in 1-2 positive coping skills daily as part of developing self-care routine; Reduce average anxiety level from 7/10 down to 5/10 in next 6 months by utilizing 1-2 relaxation skills/grounding skills per day, such as mindful breathing, progressive muscle relaxation, positive visualizations.   ProgressTowards Goals: Progressing   Summary: Renee Hahn is a 53 y.o. female who presents for today's session on time and was alert, oriented x5, with no evidence or self-report of SI/HI or A/V H.  Patient reported ongoing compliance with medication and denied any use of alcohol or illicit substances. Clinician inquired about patient's current emotional ratings, as well as any significant changes in thoughts, feelings or behaviors since previous session. Patient reported scores of 3/10 for depression, 6/10 for anxiety, 2/10 for anger/irritability, and denied any panic attacks. Pt reports on her coping skills that she finds effective.  Pt reports on her current stressors: work, health, family relationships. Cln provided psychoeducation on the cycle of addiction. Pt reports she and her brother are moving into a rental house this week. Cln asked open ended questions.  .   Assessment and plan: Counselor will continue to meet with patient to address treatment plan goals. Patient will continue to follow recommendations of providers and implement skills learned in session.    Suicidal/Homicidal: Nowithout intent/plan   Plan: life with  alcoholic, low self esteem, co-dependency  Diagnosis: Anxiety due to a medical condition, Depression  Collaboration of Care: Continue to work with providers  Patient/Guardian was advised Release of Information must be obtained prior to any record release in order to collaborate their care with an outside provider. Patient/Guardian was advised if they have not already done so to contact the registration department to sign all necessary forms in order for us  to release information regarding their care.   Consent: Patient/Guardian gives verbal consent for treatment and assignment of benefits for services provided during this visit. Patient/Guardian expressed understanding and agreed to proceed.   Shevelle Smither S, LCAS 01/2924

## 2024-02-02 NOTE — Progress Notes (Signed)
 [facility] Summary Report / Loop Recorder

## 2024-02-04 ENCOUNTER — Encounter: Payer: Self-pay | Admitting: Internal Medicine

## 2024-02-07 ENCOUNTER — Encounter (HOSPITAL_COMMUNITY): Payer: Self-pay | Admitting: Licensed Clinical Social Worker

## 2024-02-07 ENCOUNTER — Ambulatory Visit (INDEPENDENT_AMBULATORY_CARE_PROVIDER_SITE_OTHER): Admitting: Licensed Clinical Social Worker

## 2024-02-07 DIAGNOSIS — F064 Anxiety disorder due to known physiological condition: Secondary | ICD-10-CM | POA: Diagnosis not present

## 2024-02-07 DIAGNOSIS — F32A Depression, unspecified: Secondary | ICD-10-CM

## 2024-02-07 DIAGNOSIS — F321 Major depressive disorder, single episode, moderate: Secondary | ICD-10-CM

## 2024-02-07 NOTE — Progress Notes (Signed)
 Virtual Visit via Video Note   I connected with Renee Hahn on 02/07/24  1-2pm EDT by a video enabled telemedicine application and verified that I am speaking with the correct person using two identifiers.   Location: Patient: Home Provider: Home Office   I discussed the limitations of evaluation and management by telemedicine and the availability of in person appointments. The patient expressed  understanding and agreed to proceed.    THERAPIST PROGRESS NOTE  Session Time: 1-2pm  Participation Level: Active  Behavioral Response: CasualAlertEuthymic  Type of Therapy: Individual Therapy  Treatment Goals addressed: Meet with clinician weekly for therapy to monitor for progress towards goals and address any barriers to success; Reduce depression from average severity level of 6/10 down to a 4/10 in next 6 months by engaging in 1-2 positive coping skills daily as part of developing self-care routine; Reduce average anxiety level from 7/10 down to 5/10 in next 6 months by utilizing 1-2 relaxation skills/grounding skills per day, such as mindful breathing, progressive muscle relaxation, positive visualizations.   ProgressTowards Goals: Progressing   Summary: Renee Hahn is a 53 y.o. female who presents for today's session on time and was alert, oriented x5, with no evidence or self-report of SI/HI or A/V H.  Patient reported ongoing compliance with medication and denied any use of alcohol or illicit substances. Clinician inquired about patient's current emotional ratings, as well as any significant changes in thoughts, feelings or behaviors since previous session. Patient reported scores of 3/10 for depression, 5/10 for anxiety, 2/10 for anger/irritability, and denied any panic attacks. Pt reports on her coping skills that she finds effective.  Pt reports on her current stressors: work, health, family relationships.  Pt reports on her move into a rental house this week. I love it here, it's peaceful and safe.. Cln asked open ended questions. Clinician utilized CBT to process concerns about health, worries about stress from job, and challenges. Clinician processed thoughts, feelings, and behaviors. Clinician discussed coping skills and options for supporting herself through the stress pf moving. .   Assessment and plan: Counselor will continue to meet with patient to address  treatment plan goals. Patient will continue to follow recommendations of providers and implement skills learned in session.    Suicidal/Homicidal: Nowithout intent/plan   Plan: life with alcoholic, low self esteem, co-dependency  Diagnosis: Anxiety due to a medical condition, Depression  Collaboration of Care: Continue to work with providers  Patient/Guardian was advised Release of Information must be obtained prior to any record release in order to collaborate their care with an outside provider. Patient/Guardian was advised if they have not already done so to contact the registration department to sign all necessary forms in order for us  to release information regarding their care.   Consent: Patient/Guardian gives verbal consent for treatment and assignment of benefits for services provided during this visit. Patient/Guardian expressed understanding and agreed to proceed.   Renee Hahn S, LCAS 8/525

## 2024-02-14 ENCOUNTER — Encounter (HOSPITAL_COMMUNITY): Payer: Self-pay | Admitting: Licensed Clinical Social Worker

## 2024-02-14 ENCOUNTER — Ambulatory Visit (HOSPITAL_COMMUNITY): Admitting: Licensed Clinical Social Worker

## 2024-02-14 DIAGNOSIS — F321 Major depressive disorder, single episode, moderate: Secondary | ICD-10-CM | POA: Diagnosis not present

## 2024-02-14 DIAGNOSIS — F064 Anxiety disorder due to known physiological condition: Secondary | ICD-10-CM | POA: Diagnosis not present

## 2024-02-14 NOTE — Progress Notes (Signed)
 Virtual Visit via Video Note   I connected with Renee Hahn on 02/14/24  1-2pm EDT by a video enabled telemedicine application and verified that I am speaking with the correct person using two identifiers.   Location: Patient: Home Provider: Home Office   I discussed the limitations of evaluation and management by telemedicine and the availability of in person appointments. The patient expressed  understanding and agreed to proceed.    THERAPIST PROGRESS NOTE  Session Time: 1-2pm  Participation Level: Active  Behavioral Response: CasualAlertEuthymic  Type of Therapy: Individual Therapy  Treatment Goals addressed: Meet with clinician weekly for therapy to monitor for progress towards goals and address any barriers to success; Reduce depression from average severity level of 6/10 down to a 4/10 in next 6 months by engaging in 1-2 positive coping skills daily as part of developing self-care routine; Reduce average anxiety level from 7/10 down to 5/10 in next 6 months by utilizing 1-2 relaxation skills/grounding skills per day, such as mindful breathing, progressive muscle relaxation, positive visualizations.   ProgressTowards Goals: Progressing   Summary: Renee Hahn is a 53 y.o. female who presents for today'Hahn session on time and was alert, oriented x5, with no evidence or self-report of SI/HI or A/V H.  Patient reported ongoing compliance with medication and denied any use of alcohol or illicit substances. Clinician inquired about patient'Hahn current emotional ratings, as well as any significant changes in thoughts, feelings or behaviors since previous session. Patient reported scores of 3/10 for depression, 5/10 for anxiety, 2/10 for anger/irritability, and denied any panic attacks. Pt reports on her coping skills that are effective.  Pt reports on her current stressors: work, health, family relationships.  Pt reports on her move and now unpacking boxes over the past week. I'm still loving it here, it'Hahn peaceful and safe.. Cln asked open ended questions.Clinician utilized MI OARS to reflect and summarize thoughts and feelings.   Assessment and plan: Counselor will continue to meet with patient to address treatment plan goals. Patient will continue to follow recommendations of providers and implement skills learned in session.    Suicidal/Homicidal: Nowithout  intent/plan   Plan: life with alcoholic, low self esteem, co-dependency  Diagnosis: Anxiety due to a medical condition, Depression  Collaboration of Care: Continue to work with providers  Patient/Guardian was advised Release of Information must be obtained prior to any record release in order to collaborate their care with an outside provider. Patient/Guardian was advised if they have not already done so to contact the registration department to sign all necessary forms in order for us  to release information regarding their care.   Consent: Patient/Guardian gives verbal consent for treatment and assignment of benefits for services provided during this visit. Patient/Guardian expressed understanding and agreed to proceed.   Renee Hahn, LCAS 02/14/24

## 2024-02-16 ENCOUNTER — Ambulatory Visit (INDEPENDENT_AMBULATORY_CARE_PROVIDER_SITE_OTHER)

## 2024-02-16 DIAGNOSIS — Z8673 Personal history of transient ischemic attack (TIA), and cerebral infarction without residual deficits: Secondary | ICD-10-CM

## 2024-02-16 LAB — CUP PACEART REMOTE DEVICE CHECK
Date Time Interrogation Session: 20250813234559
Implantable Pulse Generator Implant Date: 20250404

## 2024-02-20 ENCOUNTER — Other Ambulatory Visit: Payer: Self-pay | Admitting: Nurse Practitioner

## 2024-02-20 DIAGNOSIS — D500 Iron deficiency anemia secondary to blood loss (chronic): Secondary | ICD-10-CM

## 2024-02-21 ENCOUNTER — Ambulatory Visit (INDEPENDENT_AMBULATORY_CARE_PROVIDER_SITE_OTHER): Admitting: Licensed Clinical Social Worker

## 2024-02-21 DIAGNOSIS — F321 Major depressive disorder, single episode, moderate: Secondary | ICD-10-CM | POA: Diagnosis not present

## 2024-02-21 DIAGNOSIS — F064 Anxiety disorder due to known physiological condition: Secondary | ICD-10-CM | POA: Diagnosis not present

## 2024-02-22 ENCOUNTER — Encounter: Payer: Self-pay | Admitting: Physician Assistant

## 2024-02-23 ENCOUNTER — Ambulatory Visit: Admitting: Nurse Practitioner

## 2024-02-23 VITALS — BP 136/82 | HR 62 | Temp 98.3°F | Ht 59.0 in | Wt 272.4 lb

## 2024-02-23 DIAGNOSIS — I10 Essential (primary) hypertension: Secondary | ICD-10-CM | POA: Diagnosis not present

## 2024-02-23 DIAGNOSIS — D500 Iron deficiency anemia secondary to blood loss (chronic): Secondary | ICD-10-CM | POA: Diagnosis not present

## 2024-02-23 DIAGNOSIS — Z23 Encounter for immunization: Secondary | ICD-10-CM

## 2024-02-23 DIAGNOSIS — F064 Anxiety disorder due to known physiological condition: Secondary | ICD-10-CM | POA: Diagnosis not present

## 2024-02-23 DIAGNOSIS — R7303 Prediabetes: Secondary | ICD-10-CM | POA: Diagnosis not present

## 2024-02-23 DIAGNOSIS — E782 Mixed hyperlipidemia: Secondary | ICD-10-CM | POA: Diagnosis not present

## 2024-02-23 LAB — RENAL FUNCTION PANEL
Albumin: 3.7 g/dL (ref 3.5–5.2)
BUN: 12 mg/dL (ref 6–23)
CO2: 30 meq/L (ref 19–32)
Calcium: 9 mg/dL (ref 8.4–10.5)
Chloride: 104 meq/L (ref 96–112)
Creatinine, Ser: 0.75 mg/dL (ref 0.40–1.20)
GFR: 92.24 mL/min (ref >60.00)
Glucose, Bld: 93 mg/dL (ref 70–99)
Phosphorus: 3.5 mg/dL (ref 2.3–4.6)
Potassium: 4 meq/L (ref 3.5–5.1)
Sodium: 140 meq/L (ref 135–145)

## 2024-02-23 LAB — CBC WITH DIFFERENTIAL/PLATELET
Basophils Absolute: 0 K/uL (ref 0.0–0.1)
Basophils Relative: 0.4 % (ref 0.0–3.0)
Eosinophils Absolute: 0.1 K/uL (ref 0.0–0.7)
Eosinophils Relative: 1.7 % (ref 0.0–5.0)
HCT: 38.5 % (ref 36.0–46.0)
Hemoglobin: 12.2 g/dL (ref 12.0–15.0)
Lymphocytes Relative: 25.4 % (ref 12.0–46.0)
Lymphs Abs: 1.8 K/uL (ref 0.7–4.0)
MCHC: 31.6 g/dL (ref 30.0–36.0)
MCV: 73.5 fl — ABNORMAL LOW (ref 78.0–100.0)
Monocytes Absolute: 0.4 K/uL (ref 0.1–1.0)
Monocytes Relative: 5.1 % (ref 3.0–12.0)
Neutro Abs: 4.9 K/uL (ref 1.4–7.7)
Neutrophils Relative %: 67.4 % (ref 43.0–77.0)
Platelets: 259 K/uL (ref 150.0–400.0)
RBC: 5.24 Mil/uL — ABNORMAL HIGH (ref 3.87–5.11)
RDW: 18.9 % — ABNORMAL HIGH (ref 11.5–15.5)
WBC: 7.3 K/uL (ref 4.0–10.5)

## 2024-02-23 LAB — LIPID PANEL
Cholesterol: 98 mg/dL (ref 0–200)
HDL: 34.4 mg/dL — ABNORMAL LOW (ref >39.00)
LDL Cholesterol: 47 mg/dL (ref 0–99)
NonHDL: 64.02
Total CHOL/HDL Ratio: 3
Triglycerides: 84 mg/dL (ref 0.0–149.0)
VLDL: 16.8 mg/dL (ref 0.0–40.0)

## 2024-02-23 LAB — HEMOGLOBIN A1C: Hgb A1c MFr Bld: 6.3 % (ref 4.6–6.5)

## 2024-02-23 MED ORDER — FERROUS GLUCONATE 324 (38 FE) MG PO TABS: 324.0000 mg | ORAL_TABLET | Freq: Every day | ORAL | 0 refills | Status: DC

## 2024-02-23 NOTE — Assessment & Plan Note (Signed)
 Repeat hgbA1c

## 2024-02-23 NOTE — Assessment & Plan Note (Signed)
 Repeat lipid panel Maintain zetia  and atorvastatin  dose

## 2024-02-23 NOTE — Assessment & Plan Note (Addendum)
 Improved and stable mood with CBT sessions and use of lexapro 

## 2024-02-23 NOTE — Patient Instructions (Addendum)
 Schedule appointment with GI for colonoscopy Go to lab Schedule nurse visit fro 2nd shingrix  vaccine Return stool sample to lab Maintain Heart healthy diet and daily exercise. Maintain current medications.

## 2024-02-23 NOTE — Assessment & Plan Note (Signed)
 Advised to schedule appointment with GI for colonoscopy. IFOB was not completed as instructed  Main oral iron  supplement  Repeat CBC and iron  panel

## 2024-02-23 NOTE — Progress Notes (Signed)
 Established Patient Visit  Patient: Renee Hahn   DOB: [birth year]   53 y.o. Female  MRN: [id number] Visit Date: 02/23/2024  Subjective:    Chief Complaint  Patient presents with   Follow-up    (FASTING) 3 month follow up for Anxiety/Depression, HTN, Hyperlipidemia Refill Iron  tablets  DUE for Colonoscopy, Zoster & Pneumococcal vaccines    HPI Prediabetes Repeat hgbA1c  Mixed hyperlipidemia Repeat lipid panel Maintain zetia  and atorvastatin  dose  Iron  deficiency anemia due to chronic blood loss Advised to schedule appointment with GI for colonoscopy. IFOB was not completed as instructed  Main oral iron  supplement  Repeat CBC and iron  panel  Anxiety disorder due to medical condition Improved and stable mood with CBT sessions and use of lexapro   Reviewed medical, surgical, and social history today  Medications: Outpatient Medications Prior to Visit  Medication Sig   acetaminophen  (TYLENOL ) 650 MG CR tablet Take 650-1,300 mg by mouth every 8 (eight) hours as needed for pain.   atorvastatin  (LIPITOR) 80 MG tablet Take 1 tablet (80 mg total) by mouth daily.   clopidogrel  (PLAVIX ) 75 MG tablet Take 1 tablet (75 mg total) by mouth daily.   escitalopram  (LEXAPRO ) 10 MG tablet TAKE 1 TABLET BY MOUTH EVERY DAY   ezetimibe  (ZETIA ) 10 MG tablet Take 1 tablet (10 mg total) by mouth daily.   pantoprazole  (PROTONIX ) 20 MG tablet TAKE 1 TABLET BY MOUTH EVERY DAY IN THE MORNING   Sodium Fluoride (FLUORIDEX DT) Place 1 Application onto teeth 2 (two) times daily.   trolamine salicylate (ASPERCREME) 10 % cream Apply 1 Application topically as needed for muscle pain. *as needed for legs*   [DISCONTINUED] ferrous gluconate  (FERGON) 324 MG tablet Take 324 mg by mouth daily with breakfast.   [DISCONTINUED] Study - OCEANIC-STROKE - asundexian 50 mg or placebo tablet (PI-Sethi) Take 1 tablet (50 mg total) by mouth daily. For Investigational Use Only   No facility-administered  medications prior to visit.   Reviewed past medical and social history.   ROS per HPI above      Objective:  BP 136/82 (BP Location: Left Arm, Patient Position: Sitting, Cuff Size: Large)   Pulse 62   Temp 98.3 F (36.8 C) (Oral)   Ht 4' 11 (1.499 m)   Wt 272 lb 6.4 oz (123.6 kg)   LMP 11/17/2022 (Approximate)   SpO2 99%   BMI 55.02 kg/m      Physical Exam Vitals and nursing note reviewed.  Cardiovascular:     Rate and Rhythm: Normal rate and regular rhythm.     Pulses: Normal pulses.     Heart sounds: Normal heart sounds.  Pulmonary:     Effort: Pulmonary effort is normal.     Breath sounds: Normal breath sounds.  Abdominal:     Tenderness: There is no abdominal tenderness.  Neurological:     Mental Status: She is alert and oriented to person, place, and time.  Psychiatric:        Mood and Affect: Mood normal.        Behavior: Behavior normal.        Thought Content: Thought content normal.     No results found for any visits on 02/23/24.    Assessment & Plan:    Problem List Items Addressed This Visit     Anxiety disorder due to medical condition   Improved and stable mood with CBT sessions  and use of lexapro       Hypertension - Primary   Relevant Orders   Renal Function Panel   Iron  deficiency anemia due to chronic blood loss   Advised to schedule appointment with GI for colonoscopy. IFOB was not completed as instructed  Main oral iron  supplement  Repeat CBC and iron  panel      Relevant Medications   ferrous gluconate  (FERGON) 324 MG tablet   Other Relevant Orders   CBC with Differential/Platelet   Iron , TIBC and Ferritin Panel   Mixed hyperlipidemia   Repeat lipid panel Maintain zetia  and atorvastatin  dose      Relevant Orders   Lipid panel   Prediabetes   Repeat hgbA1c      Relevant Orders   Hemoglobin A1c   Other Visit Diagnoses       Immunization due       Relevant Orders   Pneumococcal conjugate vaccine 20-valent (Prevnar  20) (Completed)   Zoster Recombinant (Shingrix  ) (Completed)      Return in about 3 months (around 05/25/2024) for HTN, prediabetes, depression and anxiety, hyperlipidemia (fasting).     Roselie Mood, NP

## 2024-02-24 ENCOUNTER — Encounter

## 2024-02-24 LAB — IRON,TIBC AND FERRITIN PANEL
%SAT: 7 % — ABNORMAL LOW (ref 16–45)
Ferritin: 14 ng/mL — ABNORMAL LOW (ref 16–232)
Iron: 26 ug/dL — ABNORMAL LOW (ref 45–160)
TIBC: 359 ug/dL (ref 250–450)

## 2024-02-26 ENCOUNTER — Encounter (HOSPITAL_COMMUNITY): Payer: Self-pay | Admitting: Licensed Clinical Social Worker

## 2024-02-26 NOTE — Progress Notes (Signed)
 Virtual Visit via Video Note   I connected with Renee Hahn on 02/21/24  1-2pm EDT by a video enabled telemedicine application and verified that I am speaking with the correct person using two identifiers.   Location: Patient: Home Provider: Home Office   I discussed the limitations of evaluation and management by telemedicine and the availability of in person appointments. The patient expressed  understanding and agreed to proceed.    THERAPIST PROGRESS NOTE  Session Time: 1-2pm  Participation Level: Active  Behavioral Response: CasualAlertEuthymic  Type of Therapy: Individual Therapy  Treatment Goals addressed: Meet with clinician weekly for therapy to monitor for progress towards goals and address any barriers to success; Reduce depression from average severity level of 6/10 down to a 4/10 in next 6 months by engaging in 1-2 positive coping skills daily as part of developing self-care routine; Reduce average anxiety level from 7/10 down to 5/10 in next 6 months by utilizing 1-2 relaxation skills/grounding skills per day, such as mindful breathing, progressive muscle relaxation, positive visualizations.   ProgressTowards Goals: Progressing   Summary: Renee Hahn is a 53 y.o. female who presents for today'Hahn session on time and was alert, oriented x5, with no evidence or self-report of SI/HI or A/V H.  Patient reported ongoing compliance with medication and denied any use of alcohol or illicit substances. Clinician inquired about patient'Hahn current emotional ratings, as well as any significant changes in thoughts, feelings or behaviors since previous session. Patient reported scores of 3/10 for depression, 4/10 for anxiety, 2/10 for anger/irritability, and denied any panic attacks. Pt reports on her coping skills that are effective.  Pt reports on her current stressors: work, health, family relationships.  Pt reports on her move and now unpacking boxes over the past week and being back at work, starting a new class. Cln asked open ended questions.. Clinician used  CBT assist pt in processing thoughts, feelings, and behaviors due to her continued stressors.   Assessment and plan: Counselor will continue to meet with patient to address treatment plan goals. Patient will continue to follow recommendations of providers and implement skills learned in session.    Suicidal/Homicidal:  Nowithout intent/plan   Plan: life with alcoholic, low self esteem, co-dependency  Diagnosis: Anxiety due to a medical condition, Depression  Collaboration of Care: Continue to work with providers  Patient/Guardian was advised Release of Information must be obtained prior to any record release in order to collaborate their care with an outside provider. Patient/Guardian was advised if they have not already done so to contact the registration department to sign all necessary forms in order for us  to release information regarding their care.   Consent: Patient/Guardian gives verbal consent for treatment and assignment of benefits for services provided during this visit. Patient/Guardian expressed understanding and agreed to proceed.   Renee Hahn, LCAS 02/21/24

## 2024-02-28 ENCOUNTER — Ambulatory Visit (INDEPENDENT_AMBULATORY_CARE_PROVIDER_SITE_OTHER): Admitting: Licensed Clinical Social Worker

## 2024-02-28 ENCOUNTER — Ambulatory Visit: Payer: Self-pay | Admitting: Nurse Practitioner

## 2024-02-28 DIAGNOSIS — F321 Major depressive disorder, single episode, moderate: Secondary | ICD-10-CM

## 2024-02-28 DIAGNOSIS — F064 Anxiety disorder due to known physiological condition: Secondary | ICD-10-CM

## 2024-02-29 ENCOUNTER — Encounter (HOSPITAL_COMMUNITY): Payer: Self-pay | Admitting: Licensed Clinical Social Worker

## 2024-02-29 NOTE — Progress Notes (Signed)
 Virtual Visit via Video Note   I connected with [name] on 02/28/24  1-2pm EDT by a video enabled telemedicine application and verified that I am speaking with the correct person using two identifiers.   Location: Patient: Home Provider: Home Office   I discussed the limitations of evaluation and management by telemedicine and the availability of in person appointments. The patient expressed  understanding and agreed to proceed.    THERAPIST PROGRESS NOTE  Session Time: 1-2pm  Participation Level: Active  Behavioral Response: CasualAlertEuthymic  Type of Therapy: Individual Therapy  Treatment Goals addressed: Meet with clinician weekly for therapy to monitor for progress towards goals and address any barriers to success; Reduce depression from average severity level of 6/10 down to a 4/10 in next 6 months by engaging in 1-2 positive coping skills daily as part of developing self-care routine; Reduce average anxiety level from 7/10 down to 5/10 in next 6 months by utilizing 1-2 relaxation skills/grounding skills per day, such as mindful breathing, progressive muscle relaxation, positive visualizations.   ProgressTowards Goals: Progressing   Summary: Renee Hahn is a 53 y.o. female who presents for today's session on time and was alert, oriented x5, with no evidence or self-report of SI/HI or A/V H.  Patient reported ongoing compliance with medication and denied any use of alcohol or illicit substances. Clinician inquired about patient's current emotional ratings, as well as any significant changes in thoughts, feelings or behaviors since previous session. Patient reported scores of 3/10 for depression, 4/10 for anxiety, 2/10 for anger/irritability, and denied any panic attacks. Pt reports on her coping skills that are effective.  Pt reports on her current stressors: work, health, family relationships.  I'm having behavior problems within my current class. Cln and pt role played behavior scenarios within the class.Clinician discussed coping skills and options for supporting herself through these challenging times.   Assessment and plan: Counselor will continue to meet with patient to address treatment plan goals. Patient will continue to follow recommendations of providers and implement skills learned in session.    Suicidal/Homicidal: Nowithout intent/plan   Plan: life  with alcoholic, low self esteem, co-dependency  Diagnosis: Anxiety due to a medical condition, Depression  Collaboration of Care: Continue to work with providers  Patient/Guardian was advised Release of Information must be obtained prior to any record release in order to collaborate their care with an outside provider. Patient/Guardian was advised if they have not already done so to contact the registration department to sign all necessary forms in order for us  to release information regarding their care.   Consent: Patient/Guardian gives verbal consent for treatment and assignment of benefits for services provided during this visit. Patient/Guardian expressed understanding and agreed to proceed.   [name], LCAS 02/28/24

## 2024-03-12 ENCOUNTER — Ambulatory Visit (HOSPITAL_COMMUNITY): Admitting: Licensed Clinical Social Worker

## 2024-03-12 DIAGNOSIS — F064 Anxiety disorder due to known physiological condition: Secondary | ICD-10-CM

## 2024-03-12 DIAGNOSIS — F32A Depression, unspecified: Secondary | ICD-10-CM

## 2024-03-12 DIAGNOSIS — F321 Major depressive disorder, single episode, moderate: Secondary | ICD-10-CM

## 2024-03-18 ENCOUNTER — Encounter (HOSPITAL_COMMUNITY): Payer: Self-pay | Admitting: Licensed Clinical Social Worker

## 2024-03-18 NOTE — Progress Notes (Signed)
 Virtual Visit via Video Note   I connected with Renee Hahn on 03/12/24  1-2pm EDT by a video enabled telemedicine application and verified that I am speaking with the correct person using two identifiers.   Location: Patient: Home Provider: Home Office   I discussed the limitations of evaluation and management by telemedicine and the availability of in person appointments. The patient expressed  understanding and agreed to proceed.    THERAPIST PROGRESS NOTE  Session Time: 1-2pm  Participation Level: Active  Behavioral Response: CasualAlert/anxious  Type of Therapy: Individual Therapy  Treatment Goals addressed: Meet with clinician weekly for therapy to monitor for progress towards goals and address any barriers to success; Reduce depression from average severity level of 6/10 down to a 4/10 in next 6 months by engaging in 1-2 positive coping skills daily as part of developing self-care routine; Reduce average anxiety level from 7/10 down to 5/10 in next 6 months by utilizing 1-2 relaxation skills/grounding skills per day, such as mindful breathing, progressive muscle relaxation, positive visualizations.   ProgressTowards Goals: Progressing   Summary: Renee Hahn is a 53 y.o. female who presents for today's session on time and was alert, oriented x5, with no evidence or self-report of SI/HI or A/V H.  Patient reported ongoing compliance with medication and denied any use of alcohol or illicit substances. Clinician inquired about patient's current emotional ratings, as well as any significant changes in thoughts, feelings or behaviors since previous session. Patient reported scores of 3/10 for depression, 4/10 for anxiety, 2/10 for anger/irritability, and denied any panic attacks. Pt reports on her coping skills that are effective.  Pt reports on her current stressors: work, health, family relationships. Pt began a discussion about her co-dependent relationship with her x husband, how it began, sustained, and became toxic. Cln provided psychoeducation on co-dependent behavior and how to minimize it in future relationships.  Cln and pt role played healthy vvs unhealthy relationships.    Assessment and plan: Counselor will continue to meet with patient to address treatment plan goals. Patient will continue to follow recommendations of providers and implement skills learned in  session.    Suicidal/Homicidal: Nowithout intent/plan   Plan: life with alcoholic, low self esteem, co-dependency  Diagnosis: Anxiety due to a medical condition, Depression  Collaboration of Care: Continue to work with providers  Patient/Guardian was advised Release of Information must be obtained prior to any record release in order to collaborate their care with an outside provider. Patient/Guardian was advised if they have not already done so to contact the registration department to sign all necessary forms in order for us  to release information regarding their care.   Consent: Patient/Guardian gives verbal consent for treatment and assignment of benefits for services provided during this visit. Patient/Guardian expressed understanding and agreed to proceed.   Maxden Naji S, LCAS 03/12/24

## 2024-03-19 ENCOUNTER — Ambulatory Visit (INDEPENDENT_AMBULATORY_CARE_PROVIDER_SITE_OTHER): Admitting: Licensed Clinical Social Worker

## 2024-03-19 ENCOUNTER — Ambulatory Visit (INDEPENDENT_AMBULATORY_CARE_PROVIDER_SITE_OTHER)

## 2024-03-19 DIAGNOSIS — Z8673 Personal history of transient ischemic attack (TIA), and cerebral infarction without residual deficits: Secondary | ICD-10-CM

## 2024-03-19 DIAGNOSIS — F064 Anxiety disorder due to known physiological condition: Secondary | ICD-10-CM

## 2024-03-19 DIAGNOSIS — F32A Depression, unspecified: Secondary | ICD-10-CM | POA: Diagnosis not present

## 2024-03-19 DIAGNOSIS — F321 Major depressive disorder, single episode, moderate: Secondary | ICD-10-CM

## 2024-03-19 LAB — CUP PACEART REMOTE DEVICE CHECK
Date Time Interrogation Session: 20250915000032
Implantable Pulse Generator Implant Date: 20250404

## 2024-03-22 ENCOUNTER — Encounter (HOSPITAL_COMMUNITY): Payer: Self-pay | Admitting: Licensed Clinical Social Worker

## 2024-03-22 NOTE — Progress Notes (Signed)
 Virtual Visit via Video Note   I connected with Renee Hahn on 03/19/24  1-2pm EDT by a video enabled telemedicine application and verified that I am speaking with the correct person using two identifiers.   Location: Patient: Home Provider: Home Office   I discussed the limitations of evaluation and management by telemedicine and the availability of in person appointments. The patient expressed  understanding and agreed to proceed.    THERAPIST PROGRESS NOTE  Session Time: 1-2pm  Participation Level: Active  Behavioral Response: CasualAlert/anxious  Type of Therapy: Individual Therapy  Treatment Goals addressed: Meet with clinician weekly for therapy to monitor for progress towards goals and address any barriers to success; Reduce depression from average severity level of 6/10 down to a 4/10 in next 6 months by engaging in 1-2 positive coping skills daily as part of developing self-care routine; Reduce average anxiety level from 7/10 down to 5/10 in next 6 months by utilizing 1-2 relaxation skills/grounding skills per day, such as mindful breathing, progressive muscle relaxation, positive visualizations.   ProgressTowards Goals: Progressing   Summary: Renee Hahn is a 53 y.o. female who presents for today's session on time and was alert, oriented x5, with no evidence or self-report of SI/HI or A/V H.  Patient reported ongoing compliance with medication and denied any use of alcohol or illicit substances. Clinician inquired about patient's current emotional ratings, as well as any significant changes in thoughts, feelings or behaviors since previous session. Patient reported scores of 3/10 for depression, 4/10 for anxiety, 2/10 for anger/irritability, and denied any panic attacks. Pt reports on her coping skills that are effective.  Pt reports on her current stressors: work, health, family relationships. Pt continued her discussion about her co-dependent relationship with her x husband, how it began, sustained, and became toxic. Again, cln provided psychoeducation on co-dependent behavior and how to minimize it in future relationships. Again, cln and pt role played healthy vs unhealthy relationships.    Assessment and plan: Counselor will continue to meet with patient to address treatment plan goals. Patient will continue to follow recommendations of providers and implement skills  learned in session.    Suicidal/Homicidal: Nowithout intent/plan   Plan: life with alcoholic, low self esteem, co-dependency  Diagnosis: Anxiety due to a medical condition, Depression  Collaboration of Care: Continue to work with providers  Patient/Guardian was advised Release of Information must be obtained prior to any record release in order to collaborate their care with an outside provider. Patient/Guardian was advised if they have not already done so to contact the registration department to sign all necessary forms in order for us  to release information regarding their care.   Consent: Patient/Guardian gives verbal consent for treatment and assignment of benefits for services provided during this visit. Patient/Guardian expressed understanding and agreed to proceed.   Gara Kincade S, LCAS 03/19/24

## 2024-03-24 NOTE — Progress Notes (Signed)
 Remote Loop Recorder Transmission

## 2024-03-26 ENCOUNTER — Encounter (HOSPITAL_COMMUNITY): Payer: Self-pay

## 2024-03-26 ENCOUNTER — Ambulatory Visit (HOSPITAL_COMMUNITY): Admitting: Licensed Clinical Social Worker

## 2024-03-28 ENCOUNTER — Ambulatory Visit (INDEPENDENT_AMBULATORY_CARE_PROVIDER_SITE_OTHER): Admitting: Licensed Clinical Social Worker

## 2024-03-28 DIAGNOSIS — F32A Depression, unspecified: Secondary | ICD-10-CM

## 2024-03-28 DIAGNOSIS — F064 Anxiety disorder due to known physiological condition: Secondary | ICD-10-CM | POA: Diagnosis not present

## 2024-03-28 DIAGNOSIS — F321 Major depressive disorder, single episode, moderate: Secondary | ICD-10-CM

## 2024-03-28 NOTE — Progress Notes (Signed)
 Remote Loop Recorder Transmission

## 2024-03-30 ENCOUNTER — Encounter

## 2024-03-31 ENCOUNTER — Encounter (HOSPITAL_COMMUNITY): Payer: Self-pay | Admitting: Licensed Clinical Social Worker

## 2024-03-31 NOTE — Progress Notes (Signed)
 Virtual Visit via Video Note   I connected with Renee Hahn on 03/28/24  23pm EDT by a video enabled telemedicine application and verified that I am speaking with the correct person using two identifiers.   Location: Patient: Home Provider: Home Office   I discussed the limitations of evaluation and management by telemedicine and the availability of in person appointments. The patient expressed  understanding and agreed to proceed.    THERAPIST PROGRESS NOTE  Session Time: 2-3pm  Participation Level: Active  Behavioral Response: CasualAlert/anxious  Type of Therapy: Individual Therapy  Treatment Goals addressed: Meet with clinician weekly for therapy to monitor for progress towards goals and address any barriers to success; Reduce depression from average severity level of 6/10 down to a 4/10 in next 6 months by engaging in 1-2 positive coping skills daily as part of developing self-care routine; Reduce average anxiety level from 7/10 down to 5/10 in next 6 months by utilizing 1-2 relaxation skills/grounding skills per day, such as mindful breathing, progressive muscle relaxation, positive visualizations.   ProgressTowards Goals: Progressing   Summary: Renee Hahn is a 53 y.o. female who presents for today's session on time and was alert, oriented x5, with no evidence or self-report of SI/HI or A/V H.  Patient reported ongoing compliance with medication and denied any use of alcohol or illicit substances. Clinician inquired about patient's current emotional ratings, as well as any significant changes in thoughts, feelings or behaviors since previous session. Patient reported scores of 3/10 for depression, 4/10 for anxiety, 2/10 for anger/irritability, and denied any panic attacks. Pt reports on her coping skills that are effective.  Pt reports on her current stressors: work, health, family relationships. Pt discusses her stress at work and her effective coping skills.Pt reports, [name] been fined $6,000 for breaking her lease at the apt complex she just moved out of. I'm being harassed by a Patent attorney. Cln provided psychoeducation on breathing techniques used in session to assist pt with her stress and anxiety.  PLAN: books    Assessment and plan: Counselor will continue to meet with patient to address treatment plan goals. Patient will continue to follow recommendations of  providers and implement skills learned in session.    Suicidal/Homicidal: Nowithout intent/plan   Plan: life with alcoholic, low self esteem, co-dependency  Diagnosis: Anxiety due to a medical condition, Depression  Collaboration of Care: Continue to work with providers  Patient/Guardian was advised Release of Information must be obtained prior to any record release in order to collaborate their care with an outside provider. Patient/Guardian was advised if they have not already done so to contact the registration department to sign all necessary forms in order for us  to release information regarding their care.   Consent: Patient/Guardian gives verbal consent for treatment and assignment of benefits for services provided during this visit. Patient/Guardian expressed understanding and agreed to proceed.   [name] S, LCAS 03/28/24

## 2024-04-02 ENCOUNTER — Ambulatory Visit (HOSPITAL_COMMUNITY): Admitting: Licensed Clinical Social Worker

## 2024-04-04 ENCOUNTER — Ambulatory Visit (HOSPITAL_COMMUNITY): Admitting: Licensed Clinical Social Worker

## 2024-04-04 ENCOUNTER — Encounter (HOSPITAL_COMMUNITY): Payer: Self-pay | Admitting: Licensed Clinical Social Worker

## 2024-04-04 DIAGNOSIS — F064 Anxiety disorder due to known physiological condition: Secondary | ICD-10-CM

## 2024-04-04 DIAGNOSIS — F321 Major depressive disorder, single episode, moderate: Secondary | ICD-10-CM

## 2024-04-04 NOTE — Progress Notes (Signed)
 Virtual Visit via Video Note   I connected with Renee Hahn on 04/04/24  1-2pm EDT by a video enabled telemedicine application and verified that I am speaking with the correct person using two identifiers.   Location: Patient: Home Provider: Home Office   I discussed the limitations of evaluation and management by telemedicine and the availability of in person appointments. The patient expressed  understanding and agreed to proceed.    THERAPIST PROGRESS NOTE  Session Time: 1-2pm  Participation Level: Active  Behavioral Response: CasualAlert/anxious  Type of Therapy: Individual Therapy  Treatment Goals addressed: Meet with clinician weekly for therapy to monitor for progress towards goals and address any barriers to success; Reduce depression from average severity level of 6/10 down to a 4/10 in next 6 months by engaging in 1-2 positive coping skills daily as part of developing self-care routine; Reduce average anxiety level from 7/10 down to 5/10 in next 6 months by utilizing 1-2 relaxation skills/grounding skills per day, such as mindful breathing, progressive muscle relaxation, positive visualizations.   ProgressTowards Goals: Progressing   Summary: Renee Hahn is a 53 y.o. female who presents for today's session on time and was alert, oriented x5, with no evidence or self-report of SI/HI or A/V H.  Patient reported ongoing compliance with medication and denied any use of alcohol or illicit substances. Clinician inquired about patient's current emotional ratings, as well as any significant changes in thoughts, feelings or behaviors since previous session. Patient reported scores of 4/10 for depression, 5/10 for anxiety, 2/10 for anger/irritability, and denied any panic attacks. Pt reports on her coping skills that are effective.  Pt reports on her current stressors: work, health, family relationships. Pt discusses her stress at work and her effective coping skills.Pt continued her discussion of the fine of $6,000 for breaking her lease at the apt complex she just moved out of. I'm continuing to be harassed by a Patent attorney. Cln suggested pt reach out to Legal Aid for help with information about what she can do to protect her rights. Cln gave pt information on Legal Aid. Pt began a discussion about the death of her husband and death of her father within 73 months of each  other, then she had her first stroke. Cln provided psychotherapy on the cycle of grief.      PLAN: books    Assessment and plan: Counselor will continue to meet with patient to address treatment plan goals. Patient will continue to follow recommendations of providers and implement skills learned in session.    Suicidal/Homicidal: Nowithout intent/plan   Plan: life with alcoholic, low self esteem, co-dependency  Diagnosis: Anxiety due to a medical condition, Depression  Collaboration of Care: Continue to work with providers  Patient/Guardian was advised Release of Information must be obtained prior to any record release in order to collaborate their care with an outside provider. Patient/Guardian was advised if they have not already done so to contact the registration department to sign all necessary forms in order for us  to release information regarding their care.   Consent: Patient/Guardian gives verbal consent for treatment and assignment of benefits for services provided during this visit. Patient/Guardian expressed understanding and agreed to proceed.   Robinn Overholt S, LCAS 04/04/24

## 2024-04-06 ENCOUNTER — Ambulatory Visit: Payer: Self-pay | Admitting: Student in an Organized Health Care Education/Training Program

## 2024-04-11 ENCOUNTER — Ambulatory Visit (HOSPITAL_COMMUNITY): Admitting: Licensed Clinical Social Worker

## 2024-04-11 NOTE — Progress Notes (Signed)
 Remote Loop Recorder Transmission

## 2024-04-18 ENCOUNTER — Ambulatory Visit (INDEPENDENT_AMBULATORY_CARE_PROVIDER_SITE_OTHER): Admitting: Licensed Clinical Social Worker

## 2024-04-18 ENCOUNTER — Ambulatory Visit (HOSPITAL_COMMUNITY): Admitting: Licensed Clinical Social Worker

## 2024-04-18 ENCOUNTER — Ambulatory Visit

## 2024-04-18 DIAGNOSIS — Z8673 Personal history of transient ischemic attack (TIA), and cerebral infarction without residual deficits: Secondary | ICD-10-CM

## 2024-04-18 DIAGNOSIS — F321 Major depressive disorder, single episode, moderate: Secondary | ICD-10-CM

## 2024-04-18 DIAGNOSIS — F064 Anxiety disorder due to known physiological condition: Secondary | ICD-10-CM

## 2024-04-19 ENCOUNTER — Encounter

## 2024-04-19 LAB — CUP PACEART REMOTE DEVICE CHECK
Date Time Interrogation Session: 20251014233026
Implantable Pulse Generator Implant Date: 20250404

## 2024-04-20 NOTE — Progress Notes (Signed)
 Remote Loop Recorder Transmission

## 2024-04-23 ENCOUNTER — Encounter (HOSPITAL_COMMUNITY): Payer: Self-pay | Admitting: Licensed Clinical Social Worker

## 2024-04-23 ENCOUNTER — Ambulatory Visit (HOSPITAL_COMMUNITY): Admitting: Licensed Clinical Social Worker

## 2024-04-23 DIAGNOSIS — F064 Anxiety disorder due to known physiological condition: Secondary | ICD-10-CM | POA: Diagnosis not present

## 2024-04-23 DIAGNOSIS — F321 Major depressive disorder, single episode, moderate: Secondary | ICD-10-CM

## 2024-04-23 NOTE — Progress Notes (Unsigned)
 Virtual Visit via Video Note   I connected with Renee Hahn on 04/23/24  3-4pm EDT by a video enabled telemedicine application and verified that I am speaking with the correct person using two identifiers.   Location: Patient: Home Provider: Home Office   I discussed the limitations of evaluation and management by telemedicine and the availability of in person appointments. The patient expressed  understanding and agreed to proceed.    THERAPIST PROGRESS NOTE  Session Time: 3-4 pm  Participation Level: Active  Behavioral Response: CasualAlert/anxious  Type of Therapy: Individual Therapy  Treatment Goals addressed: Meet with clinician weekly for therapy to monitor for progress towards goals and address any barriers to success; Reduce depression from average severity level of 6/10 down to a 4/10 in next 6 months by engaging in 1-2 positive coping skills daily as part of developing self-care routine; Reduce average anxiety level from 7/10 down to 5/10 in next 6 months by utilizing 1-2 relaxation skills/grounding skills per day, such as mindful breathing, progressive muscle relaxation, positive visualizations.   ProgressTowards Goals: Progressing   Summary: Renee Hahn is a 53 y.o. female who presents for today's session on time and was alert, oriented x5, with no evidence or self-report of SI/HI or A/V H.  Patient reported ongoing compliance with medication and denied any use of alcohol or illicit substances. Clinician inquired about patient's current emotional ratings, as well as any significant changes in thoughts, feelings or behaviors since previous session. Patient reported scores of 4/10 for depression, 5/10 for anxiety, 2/10 for anger/irritability, and denied any panic attacks. Pt reports on her coping skills that are effective.  Pt reports on her current stressors: work, health, family relationships. Pt discusses her stress at work and her effective coping skills.Pt continued her discussion of the fine of $6,000 for breaking her lease at the apt complex she just moved out of. I'm continuing to be harassed by a Patent attorney. Cln suggested pt reach out to Legal Aid for help with information about what she can do to protect her rights. Cln gave pt information on Legal Aid. Pt began a discussion about the death of her husband and death of her father within 6 months of each  other, then she had her first stroke. Cln provided psychotherapy on the cycle of grief.      PLAN: books    Assessment and plan: Counselor will continue to meet with patient to address treatment plan goals. Patient will continue to follow recommendations of providers and implement skills learned in session.    Suicidal/Homicidal: Nowithout intent/plan   Plan: life with alcoholic, low self esteem, co-dependency  Diagnosis: Anxiety due to a medical condition, Depression  Collaboration of Care: Continue to work with providers  Patient/Guardian was advised Release of Information must be obtained prior to any record release in order to collaborate their care with an outside provider. Patient/Guardian was advised if they have not already done so to contact the registration department to sign all necessary forms in order for us  to release information regarding their care.   Consent: Patient/Guardian gives verbal consent for treatment and assignment of benefits for services provided during this visit. Patient/Guardian expressed understanding and agreed to proceed.   Shirlena Brinegar S, LCAS 04/23/24

## 2024-04-25 ENCOUNTER — Ambulatory Visit (INDEPENDENT_AMBULATORY_CARE_PROVIDER_SITE_OTHER)

## 2024-04-25 ENCOUNTER — Ambulatory Visit: Payer: Self-pay | Admitting: Nurse Practitioner

## 2024-04-25 ENCOUNTER — Ambulatory Visit (HOSPITAL_COMMUNITY): Admitting: Licensed Clinical Social Worker

## 2024-04-25 DIAGNOSIS — Z23 Encounter for immunization: Secondary | ICD-10-CM | POA: Diagnosis not present

## 2024-04-25 DIAGNOSIS — D500 Iron deficiency anemia secondary to blood loss (chronic): Secondary | ICD-10-CM

## 2024-04-25 DIAGNOSIS — R195 Other fecal abnormalities: Secondary | ICD-10-CM

## 2024-04-25 LAB — FECAL OCCULT BLOOD, IMMUNOCHEMICAL: Fecal Occult Bld: POSITIVE — AB

## 2024-04-25 MED ORDER — FERROUS GLUCONATE 324 (38 FE) MG PO TABS: 324.0000 mg | ORAL_TABLET | Freq: Every day | ORAL | 0 refills | Status: AC

## 2024-04-25 NOTE — Progress Notes (Signed)
 After obtaining consent, and per orders of [facility], injection of Shingrix  given IM in the left deltoid by Dedra PARAS Truj[place]. Patient instructed to remain in clinic for 20 minutes afterwards, and to report any adverse reaction to me immediately. Pt tolerated well.

## 2024-04-26 ENCOUNTER — Encounter (HOSPITAL_COMMUNITY): Payer: Self-pay | Admitting: Licensed Clinical Social Worker

## 2024-04-26 NOTE — Progress Notes (Signed)
 Virtual Visit via Video Note   I connected with Renee Hahn on 04/23/24  3-4pm EDT by a video enabled telemedicine application and verified that I am speaking with the correct person using two identifiers.   Location: Patient: Home Provider: Home Office   I discussed the limitations of evaluation and management by telemedicine and the availability of in person appointments. The patient expressed  understanding and agreed to proceed.    THERAPIST PROGRESS NOTE  Session Time: 3-4 pm  Participation Level: Active  Behavioral Response: CasualAlert/anxious  Type of Therapy: Individual Therapy  Treatment Goals addressed: Meet with clinician weekly for therapy to monitor for progress towards goals and address any barriers to success; Reduce depression from average severity level of 6/10 down to a 4/10 in next 6 months by engaging in 1-2 positive coping skills daily as part of developing self-care routine; Reduce average anxiety level from 7/10 down to 5/10 in next 6 months by utilizing 1-2 relaxation skills/grounding skills per day, such as mindful breathing, progressive muscle relaxation, positive visualizations.   ProgressTowards Goals: Progressing   Summary: Renee Hahn is a 53 y.o. female who presents for today's session on time and was alert, oriented x5, with no evidence or self-report of SI/HI or A/V H.  Patient reported ongoing compliance with medication and denied any use of alcohol or illicit substances. Clinician inquired about patient's current emotional ratings, as well as any significant changes in thoughts, feelings or behaviors since previous session. Patient reported scores of 4/10 for depression, 5/10 for anxiety, 2/10 for anger/irritability, and denied any panic attacks. Pt reports on her coping skills that are effective.  Pt reports on her current stressors: work, health, family relationships. Pt continued a discussion about the death of her husband and death of her father within 27 months of each other. Cln provided psychoeducation on emotional regulation skills to assist pt in regulating her emotions, while grieving.      PLAN: books    Assessment and plan: Counselor will continue to meet with patient to address treatment plan goals. Patient will continue to follow recommendations of providers and implement skills learned in session.    Suicidal/Homicidal:  Nowithout intent/plan   Plan: life with alcoholic, low self esteem, co-dependency  Diagnosis: Anxiety due to a medical condition, Depression  Collaboration of Care: Continue to work with providers  Patient/Guardian was advised Release of Information must be obtained prior to any record release in order to collaborate their care with an outside provider. Patient/Guardian was advised if they have not already done so to contact the registration department to sign all necessary forms in order for us  to release information regarding their care.   Consent: Patient/Guardian gives verbal consent for treatment and assignment of benefits for services provided during this visit. Patient/Guardian expressed understanding and agreed to proceed.   Zan Triska S, LCAS 04/23/24

## 2024-04-29 ENCOUNTER — Ambulatory Visit: Payer: Self-pay | Admitting: Student in an Organized Health Care Education/Training Program

## 2024-04-30 ENCOUNTER — Ambulatory Visit: Payer: Self-pay | Admitting: Cardiovascular Disease

## 2024-04-30 ENCOUNTER — Ambulatory Visit (INDEPENDENT_AMBULATORY_CARE_PROVIDER_SITE_OTHER): Admitting: Licensed Clinical Social Worker

## 2024-04-30 ENCOUNTER — Encounter (HOSPITAL_COMMUNITY): Payer: Self-pay | Admitting: Licensed Clinical Social Worker

## 2024-04-30 DIAGNOSIS — F321 Major depressive disorder, single episode, moderate: Secondary | ICD-10-CM | POA: Diagnosis not present

## 2024-04-30 DIAGNOSIS — F064 Anxiety disorder due to known physiological condition: Secondary | ICD-10-CM | POA: Diagnosis not present

## 2024-04-30 NOTE — Progress Notes (Signed)
 Virtual Visit via Video Note   I connected with Renee Hahn on 04/30/24  3-4pm EDT by a video enabled telemedicine application and verified that I am speaking with the correct person using two identifiers.   Location: Patient: Home Provider: Home Office   I discussed the limitations of evaluation and management by telemedicine and the availability of in person appointments. The patient expressed  understanding and agreed to proceed.    THERAPIST PROGRESS NOTE  Session Time: 3-4 pm  Participation Level: Active  Behavioral Response: CasualAlert/anxious  Type of Therapy: Individual Therapy  Treatment Goals addressed: Meet with clinician weekly for therapy to monitor for progress towards goals and address any barriers to success; Reduce depression from average severity level of 6/10 down to a 4/10 in next 6 months by engaging in 1-2 positive coping skills daily as part of developing self-care routine; Reduce average anxiety level from 7/10 down to 5/10 in next 6 months by utilizing 1-2 relaxation skills/grounding skills per day, such as mindful breathing, progressive muscle relaxation, positive visualizations.   ProgressTowards Goals: Progressing   Summary: Renee Hahn is a 53 y.o. female who presents for today's session on time and was alert, oriented x5, with no evidence or self-report of SI/HI or A/V H.  Patient reported ongoing compliance with medication and denied any use of alcohol or illicit substances. Clinician inquired about patient's current emotional ratings, as well as any significant changes in thoughts, feelings or behaviors since previous session. Patient reported scores of 4/10 for depression, 5/10 for anxiety, 2/10 for anger/irritability, and denied any panic attacks. Pt reports on her coping skills that are effective.  Pt reports on her current stressors: work, health, family relationships. Im spending too much time being home.alone, which increases my depressive symptoms. Cln asked open ended questions. Cln provided psychoeducation on the cycle of depresssion.    PLAN: books    Assessment and plan: Counselor will continue to meet with patient to address treatment plan goals. Patient will continue to follow recommendations of providers and implement skills learned in session.    Suicidal/Homicidal: Nowithout intent/plan   Plan: life with alcoholic, low  self esteem, co-dependency  Diagnosis: Anxiety due to a medical condition, Depression  Collaboration of Care: Continue to work with providers  Patient/Guardian was advised Release of Information must be obtained prior to any record release in order to collaborate their care with an outside provider. Patient/Guardian was advised if they have not already done so to contact the registration department to sign all necessary forms in order for us  to release information regarding their care.   Consent: Patient/Guardian gives verbal consent for treatment and assignment of benefits for services provided during this visit. Patient/Guardian expressed understanding and agreed to proceed.   Renee Hahn S, LCAS 04/30/24

## 2024-05-02 ENCOUNTER — Ambulatory Visit (HOSPITAL_COMMUNITY): Admitting: Licensed Clinical Social Worker

## 2024-05-02 ENCOUNTER — Other Ambulatory Visit: Payer: Self-pay | Admitting: Nurse Practitioner

## 2024-05-04 ENCOUNTER — Encounter

## 2024-05-07 ENCOUNTER — Encounter (HOSPITAL_COMMUNITY): Payer: Self-pay | Admitting: Licensed Clinical Social Worker

## 2024-05-07 ENCOUNTER — Ambulatory Visit (INDEPENDENT_AMBULATORY_CARE_PROVIDER_SITE_OTHER): Admitting: Licensed Clinical Social Worker

## 2024-05-07 DIAGNOSIS — F321 Major depressive disorder, single episode, moderate: Secondary | ICD-10-CM | POA: Diagnosis not present

## 2024-05-07 DIAGNOSIS — F064 Anxiety disorder due to known physiological condition: Secondary | ICD-10-CM | POA: Diagnosis not present

## 2024-05-07 NOTE — Progress Notes (Signed)
 Virtual Visit via Video Note   I connected with Renee Hahn on 05/07/24  3-4pm EDT by a video enabled telemedicine application and verified that I am speaking with the correct person using two identifiers.   Location: Patient: Home Provider: Home Office   I discussed the limitations of evaluation and management by telemedicine and the availability of in person appointments. The patient expressed  understanding and agreed to proceed.    THERAPIST PROGRESS NOTE  Session Time: 3-4 pm  Participation Level: Active  Behavioral Response: CasualAlert/anxious  Type of Therapy: Individual Therapy  Treatment Goals addressed: Meet with clinician weekly for therapy to monitor for progress towards goals and address any barriers to success; Reduce depression from average severity level of 6/10 down to a 4/10 in next 6 months by engaging in 1-2 positive coping skills daily as part of developing self-care routine; Reduce average anxiety level from 7/10 down to 5/10 in next 6 months by utilizing 1-2 relaxation skills/grounding skills per day, such as mindful breathing, progressive muscle relaxation, positive visualizations.   ProgressTowards Goals: Progressing   Summary: Renee Hahn is a 53 y.o. female who presents for today's session on time and was alert, oriented x5, with no evidence or self-report of SI/HI or A/V H.  Patient reported ongoing compliance with medication and denied any use of alcohol or illicit substances. Clinician inquired about patient's current emotional ratings, as well as any significant changes in thoughts, feelings or behaviors since previous session. Patient reported scores of 4/10 for depression, 5/10 for anxiety, 2/10 for anger/irritability, and denied any panic attacks. Pt reports on her coping skills that are effective. Pt reported some of her symptoms she is experiencing: not wanting to leave the house, when go out to eat have stomach issues unable to eat, experiencing anxiety and stress when contemplates leaving the house. Cln gave pt some effective coping skills for these stressors. Clinician utilized MI OARS to reflect and summarize thoughts and feelings about this new normal life she is experiencing.   PLAN: books    Assessment and plan: Counselor will continue to meet with patient to address treatment plan goals. Patient will continue to follow recommendations  of providers and implement skills learned in session.    Suicidal/Homicidal: Nowithout intent/plan   Plan: life with alcoholic, low self esteem, co-dependency  Diagnosis: Anxiety due to a medical condition, Depression  Collaboration of Care: Continue to work with providers  Patient/Guardian was advised Release of Information must be obtained prior to any record release in order to collaborate their care with an outside provider. Patient/Guardian was advised if they have not already done so to contact the registration department to sign all necessary forms in order for us  to release information regarding their care.   Consent: Patient/Guardian gives verbal consent for treatment and assignment of benefits for services provided during this visit. Patient/Guardian expressed understanding and agreed to proceed.   Kally Cadden S, LCAS 05/07/24

## 2024-05-14 ENCOUNTER — Ambulatory Visit (INDEPENDENT_AMBULATORY_CARE_PROVIDER_SITE_OTHER): Admitting: Licensed Clinical Social Worker

## 2024-05-14 ENCOUNTER — Encounter (HOSPITAL_COMMUNITY): Payer: Self-pay | Admitting: Licensed Clinical Social Worker

## 2024-05-14 DIAGNOSIS — F064 Anxiety disorder due to known physiological condition: Secondary | ICD-10-CM | POA: Diagnosis not present

## 2024-05-14 DIAGNOSIS — F321 Major depressive disorder, single episode, moderate: Secondary | ICD-10-CM | POA: Diagnosis not present

## 2024-05-14 NOTE — Progress Notes (Signed)
 Virtual Visit via Video Note   I connected with Renee Hahn on 05/14/24  3-4pm EDT by a video enabled telemedicine application and verified that I am speaking with the correct person using two identifiers.   Location: Patient: Home Provider: Home Office   I discussed the limitations of evaluation and management by telemedicine and the availability of in person appointments. The patient expressed  understanding and agreed to proceed.    THERAPIST PROGRESS NOTE  Session Time: 3-4 pm  Participation Level: Active  Behavioral Response: CasualAlert/anxious  Type of Therapy: Individual Therapy  Treatment Goals addressed: Meet with clinician weekly for therapy to monitor for progress towards goals and address any barriers to success; Reduce depression from average severity level of 6/10 down to a 4/10 in next 6 months by engaging in 1-2 positive coping skills daily as part of developing self-care routine; Reduce average anxiety level from 7/10 down to 5/10 in next 6 months by utilizing 1-2 relaxation skills/grounding skills per day, such as mindful breathing, progressive muscle relaxation, positive visualizations.   ProgressTowards Goals: Progressing   Summary: Renee Hahn is a 53 y.o. female who presents for today'Hahn session on time and was alert, oriented x5, with no evidence or self-report of SI/HI or A/V H.  Patient reported ongoing compliance with medication and denied any use of alcohol or illicit substances. Clinician inquired about patient'Hahn current emotional ratings, as well as any significant changes in thoughts, feelings or behaviors since previous session. Patient reported scores of 4/10 for depression, 5/10 for anxiety, 2/10 for anger/irritability, and denied any panic attacks. Pt reports on her coping skills that are effective. Pt began a discussion about her new grandson just born, experiencing her own miscarriage, raising her husband'Hahn son after her husband passed away.. Clinician utilized MI OARS to reflect and summarize thoughts and feelings about her experiences she is currently experiencing.   PLAN: books    Assessment and plan: Counselor will continue to meet with patient to address treatment plan goals. Patient will continue to follow recommendations of providers and implement skills learned in session.    Suicidal/Homicidal: Nowithout intent/plan   Plan:  life with alcoholic, low self esteem, co-dependency  Diagnosis: Anxiety due to a medical condition, Depression  Collaboration of Care: Continue to work with providers  Patient/Guardian was advised Release of Information must be obtained prior to any record release in order to collaborate their care with an outside provider. Patient/Guardian was advised if they have not already done so to contact the registration department to sign all necessary forms in order for us  to release information regarding their care.   Consent: Patient/Guardian gives verbal consent for treatment and assignment of benefits for services provided during this visit. Patient/Guardian expressed understanding and agreed to proceed.   Renee Hahn, LCAS 05/14/24

## 2024-05-19 ENCOUNTER — Encounter

## 2024-05-20 ENCOUNTER — Ambulatory Visit: Attending: Student in an Organized Health Care Education/Training Program

## 2024-05-20 DIAGNOSIS — Z8673 Personal history of transient ischemic attack (TIA), and cerebral infarction without residual deficits: Secondary | ICD-10-CM

## 2024-05-21 ENCOUNTER — Ambulatory Visit (INDEPENDENT_AMBULATORY_CARE_PROVIDER_SITE_OTHER): Admitting: Licensed Clinical Social Worker

## 2024-05-21 ENCOUNTER — Encounter (HOSPITAL_COMMUNITY): Payer: Self-pay | Admitting: Licensed Clinical Social Worker

## 2024-05-21 ENCOUNTER — Encounter

## 2024-05-21 DIAGNOSIS — F064 Anxiety disorder due to known physiological condition: Secondary | ICD-10-CM | POA: Diagnosis not present

## 2024-05-21 DIAGNOSIS — F321 Major depressive disorder, single episode, moderate: Secondary | ICD-10-CM | POA: Diagnosis not present

## 2024-05-21 LAB — CUP PACEART REMOTE DEVICE CHECK
Date Time Interrogation Session: 20251115232728
Implantable Pulse Generator Implant Date: 20250404

## 2024-05-21 NOTE — Progress Notes (Signed)
 Virtual Visit via Video Note   I connected with Renee Hahn on 05/21/24  3-4pm EDT by a video enabled telemedicine application and verified that I am speaking with the correct person using two identifiers.   Location: Patient: Home Provider: Home Office   I discussed the limitations of evaluation and management by telemedicine and the availability of in person appointments. The patient expressed  understanding and agreed to proceed.    THERAPIST PROGRESS NOTE  Session Time: 3-4 pm  Participation Level: Active  Behavioral Response: CasualAlert/anxious  Type of Therapy: Individual Therapy  Treatment Goals addressed: Meet with clinician weekly for therapy to monitor for progress towards goals and address any barriers to success; Reduce depression from average severity level of 6/10 down to a 4/10 in next 6 months by engaging in 1-2 positive coping skills daily as part of developing self-care routine; Reduce average anxiety level from 7/10 down to 5/10 in next 6 months by utilizing 1-2 relaxation skills/grounding skills per day, such as mindful breathing, progressive muscle relaxation, positive visualizations.   ProgressTowards Goals: Progressing   Summary: Renee Hahn is a 53 y.o. female who presents for today's session on time and was alert, oriented x5, with no evidence or self-report of SI/HI or A/V H.  Patient reported ongoing compliance with medication and denied any use of alcohol or illicit substances. Clinician inquired about patient's current emotional ratings, as well as any significant changes in thoughts, feelings or behaviors since previous session. Patient reported scores of 4/10 for depression, 5/10 for anxiety, 2/10 for anger/irritability, and denied any panic attacks. Pt reports on her coping skills that are effective. Pt discussed how her people pleasing skills are affecting her job and self esteem. Cln provided psychoeducation on people pleasing and increasing self esteem.    PLAN: books    Assessment and plan: Counselor will continue to meet with patient to address treatment plan goals. Patient will continue to follow recommendations of providers and implement skills learned in session.    Suicidal/Homicidal: Nowithout intent/plan   Plan: life with alcoholic, low self esteem, co-dependency  Diagnosis: Anxiety due to a medical condition,  Depression  Collaboration of Care: Continue to work with providers  Patient/Guardian was advised Release of Information must be obtained prior to any record release in order to collaborate their care with an outside provider. Patient/Guardian was advised if they have not already done so to contact the registration department to sign all necessary forms in order for us  to release information regarding their care.   Consent: Patient/Guardian gives verbal consent for treatment and assignment of benefits for services provided during this visit. Patient/Guardian expressed understanding and agreed to proceed.   Renee Hahn S, LCAS 05/21/24

## 2024-05-22 NOTE — Progress Notes (Signed)
 Remote Loop Recorder Transmission

## 2024-05-23 ENCOUNTER — Other Ambulatory Visit: Payer: Self-pay | Admitting: Nurse Practitioner

## 2024-05-23 DIAGNOSIS — D500 Iron deficiency anemia secondary to blood loss (chronic): Secondary | ICD-10-CM

## 2024-05-25 ENCOUNTER — Ambulatory Visit: Admitting: Nurse Practitioner

## 2024-05-28 ENCOUNTER — Ambulatory Visit (INDEPENDENT_AMBULATORY_CARE_PROVIDER_SITE_OTHER): Admitting: Licensed Clinical Social Worker

## 2024-05-28 DIAGNOSIS — F321 Major depressive disorder, single episode, moderate: Secondary | ICD-10-CM

## 2024-05-28 DIAGNOSIS — F32A Depression, unspecified: Secondary | ICD-10-CM

## 2024-05-28 DIAGNOSIS — F064 Anxiety disorder due to known physiological condition: Secondary | ICD-10-CM | POA: Diagnosis not present

## 2024-05-30 ENCOUNTER — Encounter (HOSPITAL_COMMUNITY): Payer: Self-pay | Admitting: Licensed Clinical Social Worker

## 2024-05-30 NOTE — Progress Notes (Signed)
 Virtual Visit via Video Note   I connected with Renee Hahn on 05/28/24  3-4pm EDT by a video enabled telemedicine application and verified that I am speaking with the correct person using two identifiers.   Location: Patient: Home Provider: Home Office   I discussed the limitations of evaluation and management by telemedicine and the availability of in person appointments. The patient expressed  understanding and agreed to proceed.    THERAPIST PROGRESS NOTE  Session Time: 3-4 pm  Participation Level: Active  Behavioral Response: CasualAlert/anxious  Type of Therapy: Individual Therapy  Treatment Goals addressed: Meet with clinician weekly for therapy to monitor for progress towards goals and address any barriers to success; Reduce depression from average severity level of 6/10 down to a 4/10 in next 6 months by engaging in 1-2 positive coping skills daily as part of developing self-care routine; Reduce average anxiety level from 7/10 down to 5/10 in next 6 months by utilizing 1-2 relaxation skills/grounding skills per day, such as mindful breathing, progressive muscle relaxation, positive visualizations.   ProgressTowards Goals: Progressing   Summary: Renee Hahn is a 53 y.o. female who presents for today's session on time and was alert, oriented x5, with no evidence or self-report of SI/HI or A/V H.  Patient reported ongoing compliance with medication and denied any use of alcohol or illicit substances. Clinician inquired about patient's current emotional ratings, as well as any significant changes in thoughts, feelings or behaviors since previous session. Patient reported scores of 5/10 for depression, 6/10 for anxiety, 2/10 for anger/irritability, and denied any panic attacks. Pt reports on her coping skills that are effective. Pt reports that she is struggling at work which has increased her anxiety and depression. 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    PLAN: books    Assessment and plan: Counselor will continue to meet with patient to address treatment plan goals. Patient will continue to follow recommendations of providers and implement skills learned in session.    Suicidal/Homicidal: Nowithout intent/plan   Plan: life with alcoholic, low self esteem, co-dependency  Diagnosis:  Anxiety due to a medical condition, Depression  Collaboration of Care: Continue to work with providers  Patient/Guardian was advised Release of Information must be obtained prior to any record release in order to collaborate their care with an outside provider. Patient/Guardian was advised if they have not already done so to contact the registration department to sign all necessary forms in order for us  to release information regarding their care.   Consent: Patient/Guardian gives verbal consent for treatment and assignment of benefits for services provided during this visit. Patient/Guardian expressed understanding and agreed to proceed.   Davey Limas S, LCSW 05/28/24

## 2024-06-04 ENCOUNTER — Ambulatory Visit (INDEPENDENT_AMBULATORY_CARE_PROVIDER_SITE_OTHER): Admitting: Licensed Clinical Social Worker

## 2024-06-04 DIAGNOSIS — F321 Major depressive disorder, single episode, moderate: Secondary | ICD-10-CM

## 2024-06-04 DIAGNOSIS — F064 Anxiety disorder due to known physiological condition: Secondary | ICD-10-CM | POA: Diagnosis not present

## 2024-06-04 DIAGNOSIS — F32A Depression, unspecified: Secondary | ICD-10-CM | POA: Diagnosis not present

## 2024-06-05 ENCOUNTER — Encounter (HOSPITAL_COMMUNITY): Payer: Self-pay | Admitting: Licensed Clinical Social Worker

## 2024-06-05 NOTE — Progress Notes (Signed)
 Virtual Visit via Video Note   I connected with Renee Hahn on 06/04/24  3-4pm EDT by a video enabled telemedicine application and verified that I am speaking with the correct person using two identifiers.   Location: Patient: Home Provider: Home Office   I discussed the limitations of evaluation and management by telemedicine and the availability of in person appointments. The patient expressed  understanding and agreed to proceed.    THERAPIST PROGRESS NOTE  Session Time: 3-4 pm  Participation Level: Active  Behavioral Response: CasualAlert/anxious  Type of Therapy: Individual Therapy  Treatment Goals addressed: Meet with clinician weekly for therapy to monitor for progress towards goals and address any barriers to success; Reduce depression from average severity level of 6/10 down to a 4/10 in next 6 months by engaging in 1-2 positive coping skills daily as part of developing self-care routine; Reduce average anxiety level from 7/10 down to 5/10 in next 6 months by utilizing 1-2 relaxation skills/grounding skills per day, such as mindful breathing, progressive muscle relaxation, positive visualizations.   ProgressTowards Goals: Progressing   Summary: Renee Hahn is a 53 y.o. female who presents for today's session on time and was alert, oriented x5, with no evidence or self-report of SI/HI or A/V H.  Patient reported ongoing compliance with medication and denied any use of alcohol or illicit substances. Clinician inquired about patient's current emotional ratings, as well as any significant changes in thoughts, feelings or behaviors since previous session. Patient reported scores of 5/10 for depression, 6/10 for anxiety, 2/10 for anger/irritability, and denied any panic attacks. Pt reports on her coping skills that are effective. Pt reports that she continues to sstruggle at work which has increased her anxiety and depression. 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   PLAN: books    Assessment and plan: Counselor will continue to meet with patient to address treatment plan goals. Patient will continue to follow recommendations of providers and implement skills learned in session.    Suicidal/Homicidal: Nowithout intent/plan   Plan: life with  alcoholic, low self esteem, co-dependency  Diagnosis: Anxiety due to a medical condition, Depression  Collaboration of Care: Continue to work with providers  Patient/Guardian was advised Release of Information must be obtained prior to any record release in order to collaborate their care with an outside provider. Patient/Guardian was advised if they have not already done so to contact the registration department to sign all necessary forms in order for us  to release information regarding their care.   Consent: Patient/Guardian gives verbal consent for treatment and assignment of benefits for services provided during this visit. Patient/Guardian expressed understanding and agreed to proceed.   Julene Rahn S, LCSW 06/04/24

## 2024-06-07 ENCOUNTER — Other Ambulatory Visit: Payer: Self-pay | Admitting: Nurse Practitioner

## 2024-06-07 DIAGNOSIS — F064 Anxiety disorder due to known physiological condition: Secondary | ICD-10-CM

## 2024-06-07 DIAGNOSIS — I6930 Unspecified sequelae of cerebral infarction: Secondary | ICD-10-CM

## 2024-06-07 DIAGNOSIS — E782 Mixed hyperlipidemia: Secondary | ICD-10-CM

## 2024-06-08 ENCOUNTER — Encounter

## 2024-06-11 ENCOUNTER — Ambulatory Visit: Payer: Self-pay | Admitting: Neurology

## 2024-06-11 ENCOUNTER — Encounter: Payer: Self-pay | Admitting: Neurology

## 2024-06-11 ENCOUNTER — Ambulatory Visit (HOSPITAL_COMMUNITY): Admitting: Licensed Clinical Social Worker

## 2024-06-11 VITALS — BP 156/96 | HR 75 | Ht 59.0 in | Wt 272.6 lb

## 2024-06-11 DIAGNOSIS — E7849 Other hyperlipidemia: Secondary | ICD-10-CM | POA: Diagnosis not present

## 2024-06-11 DIAGNOSIS — I10 Essential (primary) hypertension: Secondary | ICD-10-CM | POA: Diagnosis not present

## 2024-06-11 DIAGNOSIS — I639 Cerebral infarction, unspecified: Secondary | ICD-10-CM

## 2024-06-11 DIAGNOSIS — Z9189 Other specified personal risk factors, not elsewhere classified: Secondary | ICD-10-CM

## 2024-06-11 NOTE — Progress Notes (Signed)
 [facility] [address] [place]. [place] [address] [phone]       OFFICE CONSULT  VISIT NOTE  Renee Hahn Date of Birth:  1970/10/23 Medical Record Number:  [id number]  Referring physician Farris Cummins Reason for referral stroke HPI: [name] is a pleasant 53 year old African-American lady seen for office consultation visit for stroke.  History is obtained from the patient and review of electronic medical records.  I personally reviewed pertinent available imaging films in PACS.  She has past medical history of morbid obesity, prediabetes, hyperlipidemia and iron  deficiency anemia.  History of left MCA infarct in August 2022 due to left superior division MCA occlusion treated with successful thrombectomy at [facility] by Dr. Lester with excellent recovery and no residual deficits.  Stroke etiology felt to be cryptogenic.  Patient presented on 08/10/2023 with sudden onset of difficulty reading and trouble talking.  She presented outside think about thrombolysis and NIH stroke scale was 2 mild only 1 on admission.  CT head showed loss of gray-white differentiation in the left parietal lobe suspicious for acute infarct with benefactor score of 9.  Old MCA cortical and subcortical infarct was noted involving frontal operculum and insula.  CT angiogram showed no clear large vessel occlusion.  MRI scan of the brain showed acute left MCA infarct involving left parietal and temporal lobes.  MR angiogram showed no LVO.  Carotid ultrasound showed no significant extracranial stenosis.  Transthoracic echo showed ejection fraction of 5055% with severely dilated right atrium but normal left atrium and no right-to-left shunt.  TCD bubble study was negative for PFO.  Lower extremity venous Dopplers were negative for DVT.  Loop recorder was placed prior to discharge and so far no paroxysmal A-fib has been found.  LDL cholesterol 84 mg percent.  Hemoglobin A1c of 5.5.  Urine drug screen was  negative.  Hypercoagulable panel (negative.  Patient was started on aspirin  and Plavix  for 3 weeks and then Plavix  alone.  Patient also signed informed consent and participated in [place] Stroke trial (standard of care antiplatelet therapy with or without factor XI inhibitor Asundexian ) .  Patient stated she has done well since discharge.  She has made near complete recovery.  She has no residual deficits.  She is tolerating Plavix  without bruising or bleeding.  She is tolerating Lipitor well without side effects.  She will have recent few bouts of rectal bleeding and has an appointment to see a gastroenterologist for this.  He has occasional word finding difficulties.  When she is tired or stressed.  She required her loop recorder to be relocated she has no new complaints.  She has completed participation in the oceanic stroke Trial.  ROS:   14 system review of systems is positive for speech difficulties word-finding difficulties, respiratory bleeding, bruising and all other systems negative  PMH:  Past Medical History:  Diagnosis Date   Abscess, gluteal, left 07/05/2023   Acute ischemic stroke (HCC) 08/11/2023   Allergy    Seasonal Allergies, all my life   Anxiety 08/10/2023   Stress   BMI 50.0-59.9, adult (HCC) 01/28/2020   Cellulitis and abscess of buttock 07/04/2023   Cholelithiasis    Hay fever    Headache    past   Hyperlipidemia    Iron  deficiency anemia    Menorrhagia 12/18/2019   Prediabetes    Sepsis (HCC) 07/06/2023   Stroke (HCC)    Vitamin B12 deficiency 02/27/2021    Social History:  Social History  Socioeconomic History   Marital status: Widowed    Spouse name: Not on file   Number of children: 0   Years of education: Not on file   Highest education level: Some college, no degree  Occupational History   Not on file  Tobacco Use   Smoking status: Never   Smokeless tobacco: Never  Vaping Use   Vaping status: Never Used  Substance and Sexual Activity    Alcohol use: Yes    Comment: socially   Drug use: No   Sexual activity: Not Currently    Partners: Male  Other Topics Concern   Not on file  Social History Narrative   Works for claims at [employer] Strain: Low Risk  (02/23/2024)   Overall Financial Resource Strain (CARDIA)    Difficulty of Paying Living Expenses: Not hard at all  Food Insecurity: Food Insecurity Present (02/23/2024)   Hunger Vital Sign    Worried About Running Out of Food in the Last Year: Sometimes true    Ran Out of Food in the Last Year: Never true  Transportation Needs: No Transportation Needs (02/23/2024)   PRAPARE - Administrator, Civil Service (Medical): No    Lack of Transportation (Non-Medical): No  Physical Activity: Insufficiently Active (02/23/2024)   Exercise Vital Sign    Days of Exercise per Week: 2 days    Minutes of Exercise per Session: 20 min  Stress: No Stress Concern Present (02/23/2024)   [employer] of Occupational Health - Occupational Stress Questionnaire    Feeling of Stress: Only a little  Social Connections: Moderately Integrated (02/23/2024)   Social Connection and Isolation Panel    Frequency of Communication with Friends and Family: Twice a week    Frequency of Social Gatherings with Friends and Family: More than three times a week    Attends Religious Services: 1 to 4 times per year    Active Member of [employer] or Organizations: Yes    Attends Banker Meetings: 1 to 4 times per year    Marital Status: Widowed  Intimate Partner Violence: Not At Risk (08/10/2023)   Humiliation, Afraid, Rape, and Kick questionnaire    Fear of Current or Ex-Partner: No    Emotionally Abused: No    Physically Abused: No    Sexually Abused: No    Medications:   Current Outpatient Medications on File Prior to Visit  Medication Sig Dispense Refill   acetaminophen  (TYLENOL ) 650 MG CR tablet Take 650-1,300 mg by mouth every 8 (eight)  hours as needed for pain.     atorvastatin  (LIPITOR) 80 MG tablet TAKE 1 TABLET BY MOUTH EVERY DAY 90 tablet 2   clopidogrel  (PLAVIX ) 75 MG tablet Take 1 tablet (75 mg total) by mouth daily. 90 tablet 3   escitalopram  (LEXAPRO ) 10 MG tablet TAKE 1 TABLET BY MOUTH EVERY DAY 90 tablet 2   ezetimibe  (ZETIA ) 10 MG tablet TAKE 1 TABLET BY MOUTH EVERY DAY 90 tablet 1   ferrous gluconate  (FERGON) 324 MG tablet Take 1 tablet (324 mg total) by mouth daily with breakfast. 90 tablet 0   pantoprazole  (PROTONIX ) 20 MG tablet TAKE 1 TABLET BY MOUTH EVERY DAY IN THE MORNING 90 tablet 0   Sodium Fluoride (FLUORIDEX DT) Place 1 Application onto teeth 2 (two) times daily.     trolamine salicylate (ASPERCREME) 10 % cream Apply 1 Application topically as needed for muscle pain. *as needed for  legs*     No current facility-administered medications on file prior to visit.    Allergies:   Allergies  Allergen Reactions   Iodinated Contrast Media Itching    Physical Exam General: well developed, well nourished, seated, in no evident distress Head: head normocephalic and atraumatic.  Neck: supple with no carotid or supraclavicular bruits Cardiovascular: regular rate and rhythm, no murmurs Musculoskeletal: no deformity Skin:  no rash/petichiae Vascular:  Normal pulses all extremities Vitals:   06/11/24 0914  BP: (!) 156/96  Pulse: 75  SpO2: 98%   Neurologic Exam Mental Status: Awake and fully alert. Oriented to place and time. Recent and remote memory intact. Attention span, concentration and fund of knowledge appropriate. Mood and affect appropriate.  Cranial Nerves: Fundoscopic exam reveals sharp disc margins. Pupils equal, briskly reactive to light. Extraocular movements full without nystagmus. Visual fields full to confrontation. Hearing intact. Facial sensation intact.  Face., tongue, palate moves normally and symmetrically.  Motor: Normal bulk and tone. Normal strength in all tested extremity  muscles. Sensory.: intact to touch ,pinprick .position and vibratory sensation.  Coordination: Rapid alternating movements normal in all extremities. Finger-to-nose and heel-to-shin performed accurately bilaterally. Gait and Station: Arises from chair without difficulty. Stance is normal. Gait demonstrates normal stride length and balance . Able to heel, toe and tandem walk without difficulty.  Reflexes: 1+ and symmetric. Toes downgoing.   NIHSS  0 Modified Rankin  1   ASSESSMENT: 53 year old African-American lady with recurrent left MCA branch infarcts in 2022 and February 2025 of cryptogenic etiology.  He is doing remarkably well no significant peripheral deficits.  She has completed participation in the oceanic stroke trial.  Multiple vascular risk factors of morbid obesity, at risk for sleep apnea, diabetes, hypertension, hyperlipidemia     PLAN:I had a long d/w patient about her  recent cryptogenic stroke, hypertension, hyperlipidemia, obesity and at risk for sleep apnea, risk for recurrent stroke/TIAs, personally independently reviewed imaging studies and stroke evaluation results and answered questions.Continue Plavix  75 mg daily for secondary stroke prevention and maintain strict control of hypertension with blood pressure goal below 130/90, diabetes with hemoglobin A1c goal below 6.5% and lipids with LDL cholesterol goal below 70 mg/dL. I also advised the patient to eat a healthy diet with plenty of whole grains, cereals, fruits and vegetables, exercise regularly and maintain ideal body weight.  Refer for polysomnogram for sleep apnea.  Patient has an appointment to see  gastroenterologist for rectal bleeding.  She is neurologically cleared to hold the Plavix  for 5 days prior to scheduled colonoscopy or endoscopy resume it after procedure when safe to small for acceptable periprocedural risk for TIA/stroke if patient is willing followup in the future with me in 1 year or call earlier if  necessary.    I personally spent a total of 50 minutes in the care of the patient today including getting/reviewing separately obtained history, performing a medically appropriate exam/evaluation, counseling and educating, placing orders, referring and communicating with other health care professionals, documenting clinical information in the EHR, independently interpreting results, and coordinating care.       Eather Popp, MD  Note: This document was prepared with digital dictation and possible smart phrase technology. Any transcriptional errors that result from this process are unintentional

## 2024-06-11 NOTE — Patient Instructions (Signed)
 I had a long d/w patient about her  recent cryptogenic stroke, hypertension, hyperlipidemia, obesity and at risk for sleep apnea, risk for recurrent stroke/TIAs, personally independently reviewed imaging studies and stroke evaluation results and answered questions.Continue Plavix  75 mg daily for secondary stroke prevention and maintain strict control of hypertension with blood pressure goal below 130/90, diabetes with hemoglobin A1c goal below 6.5% and lipids with LDL cholesterol goal below 70 mg/dL. I also advised the patient to eat a healthy diet with plenty of whole grains, cereals, fruits and vegetables, exercise regularly and maintain ideal body weight.  Refer for polysomnogram for sleep apnea.  Patient has an appointment to see his gastroenterologist for rectal bleeding.  She is neurologically cleared to hold the Plavix  for 5 days prior to scheduled colonoscopy or endoscopy resume it after procedure when safe to small for acceptable periprocedural risk for TIA/stroke if patient is willing followup in the future with me in 1 year or call earlier if necessary.  Stroke Prevention Some medical conditions and behaviors can lead to a higher chance of having a stroke. You can help prevent a stroke by eating healthy, exercising, not smoking, and managing any medical conditions you have. Stroke is a leading cause of functional impairment. Primary prevention is particularly important because a majority of strokes are first-time events. Stroke changes the lives of not only those who experience a stroke but also their family and other caregivers. How can this condition affect me? A stroke is a medical emergency and should be treated right away. A stroke can lead to brain damage and can sometimes be life-threatening. If a person gets medical treatment right away, there is a better chance of surviving and recovering from a stroke. What can increase my risk? The following medical conditions may increase your risk of  a stroke: Cardiovascular disease. High blood pressure (hypertension). Diabetes. High cholesterol. Sickle cell disease. Blood clotting disorders (hypercoagulable state). Obesity. Sleep disorders (obstructive sleep apnea). Other risk factors include: Being older than age 76. Having a history of blood clots, stroke, or mini-stroke (transient ischemic attack, TIA). Genetic factors, such as race, ethnicity, or a family history of stroke. Smoking cigarettes or using other tobacco products. Taking birth control pills, especially if you also use tobacco. Heavy use of alcohol or drugs, especially cocaine and methamphetamine. Physical inactivity. What actions can I take to prevent this? Manage your health conditions High cholesterol levels. Eating a healthy diet is important for preventing high cholesterol. If cholesterol cannot be managed through diet alone, you may need to take medicines. Take any prescribed medicines to control your cholesterol as told by your health care provider. Hypertension. To reduce your risk of stroke, try to keep your blood pressure below 130/80. Eating a healthy diet and exercising regularly are important for controlling blood pressure. If these steps are not enough to manage your blood pressure, you may need to take medicines. Take any prescribed medicines to control hypertension as told by your health care provider. Ask your health care provider if you should monitor your blood pressure at home. Have your blood pressure checked every year, even if your blood pressure is normal. Blood pressure increases with age and some medical conditions. Diabetes. Eating a healthy diet and exercising regularly are important parts of managing your blood sugar (glucose). If your blood sugar cannot be managed through diet and exercise, you may need to take medicines. Take any prescribed medicines to control your diabetes as told by your health care provider. Get evaluated for  obstructive sleep apnea. Talk to your health care provider about getting a sleep evaluation if you snore a lot or have excessive sleepiness. Make sure that any other medical conditions you have, such as atrial fibrillation or atherosclerosis, are managed. Nutrition Follow instructions from your health care provider about what to eat or drink to help manage your health condition. These instructions may include: Reducing your daily calorie intake. Limiting how much salt (sodium) you use to 1,500 milligrams (mg) each day. Using only healthy fats for cooking, such as olive oil, canola oil, or sunflower oil. Eating healthy foods. You can do this by: Choosing foods that are high in fiber, such as whole grains, and fresh fruits and vegetables. Eating at least 5 servings of fruits and vegetables a day. Try to fill one-half of your plate with fruits and vegetables at each meal. Choosing lean protein foods, such as lean cuts of meat, poultry without skin, fish, tofu, beans, and nuts. Eating low-fat dairy products. Avoiding foods that are high in sodium. This can help lower blood pressure. Avoiding foods that have saturated fat, trans fat, and cholesterol. This can help prevent high cholesterol. Avoiding processed and prepared foods. Counting your daily carbohydrate intake.  Lifestyle If you drink alcohol: Limit how much you have to: 0-1 drink a day for women who are not pregnant. 0-2 drinks a day for men. Know how much alcohol is in your drink. In the [place] one drink equals one 12 oz bottle of beer ( ), one 5 oz glass of wine ( ), or one 1 oz glass of hard liquor (44mL). Do not use any products that contain nicotine or tobacco. These products include cigarettes, chewing tobacco, and vaping devices, such as e-cigarettes. If you need help quitting, ask your health care provider. Avoid secondhand smoke. Do not use drugs. Activity  Try to stay at a healthy weight. Get at least 30 minutes of  exercise on most days, such as: Fast walking. Biking. Swimming. Medicines Take over-the-counter and prescription medicines only as told by your health care provider. Aspirin  or blood thinners (antiplatelets or anticoagulants) may be recommended to reduce your risk of forming blood clots that can lead to stroke. Avoid taking birth control pills. Talk to your health care provider about the risks of taking birth control pills if: You are over 73 years old. You smoke. You get very bad headaches. You have had a blood clot. Where to find more information American Stroke Association: www.strokeassociation.org Get help right away if: You or a loved one has any symptoms of a stroke. BE FAST is an easy way to remember the main warning signs of a stroke: B - Balance. Signs are dizziness, sudden trouble walking, or loss of balance. E - Eyes. Signs are trouble seeing or a sudden change in vision. F - Face. Signs are sudden weakness or numbness of the face, or the face or eyelid drooping on one side. A - Arms. Signs are weakness or numbness in an arm. This happens suddenly and usually on one side of the body. S - Speech. Signs are sudden trouble speaking, slurred speech, or trouble understanding what people say. T - Time. Time to call emergency services. Write down what time symptoms started. You or a loved one has other signs of a stroke, such as: A sudden, severe headache with no known cause. Nausea or vomiting. Seizure. These symptoms may represent a serious problem that is an emergency. Do not wait to see if the symptoms will go away. Get  medical help right away. Call your local emergency services (911 in the [place] Do not drive yourself to the hospital. Summary You can help to prevent a stroke by eating healthy, exercising, not smoking, limiting alcohol intake, and managing any medical conditions you may have. Do not use any products that contain nicotine or tobacco. These include cigarettes,  chewing tobacco, and vaping devices, such as e-cigarettes. If you need help quitting, ask your health care provider. Remember BE FAST for warning signs of a stroke. Get help right away if you or a loved one has any of these signs. This information is not intended to replace advice given to you by your health care provider. Make sure you discuss any questions you have with your health care provider. Document Revised: 05/24/2022 Document Reviewed: 05/24/2022 Elsevier Patient Education  2024 [employer].

## 2024-06-12 ENCOUNTER — Ambulatory Visit: Payer: Self-pay | Admitting: Student in an Organized Health Care Education/Training Program

## 2024-06-13 ENCOUNTER — Ambulatory Visit (INDEPENDENT_AMBULATORY_CARE_PROVIDER_SITE_OTHER): Admitting: Licensed Clinical Social Worker

## 2024-06-13 DIAGNOSIS — F32A Depression, unspecified: Secondary | ICD-10-CM

## 2024-06-13 DIAGNOSIS — F064 Anxiety disorder due to known physiological condition: Secondary | ICD-10-CM | POA: Diagnosis not present

## 2024-06-13 DIAGNOSIS — F321 Major depressive disorder, single episode, moderate: Secondary | ICD-10-CM

## 2024-06-14 ENCOUNTER — Encounter (HOSPITAL_COMMUNITY): Payer: Self-pay | Admitting: Licensed Clinical Social Worker

## 2024-06-14 ENCOUNTER — Encounter: Payer: Self-pay | Admitting: Nurse Practitioner

## 2024-06-14 DIAGNOSIS — Z7901 Long term (current) use of anticoagulants: Secondary | ICD-10-CM | POA: Insufficient documentation

## 2024-06-14 NOTE — Progress Notes (Signed)
 Virtual Visit via Video Note   I connected with Renee Hahn on 06/13/24  4-5pm EDT by a video enabled telemedicine application and verified that I am speaking with the correct person using two identifiers.   Location: Patient: Home Provider: Home Office   I discussed the limitations of evaluation and management by telemedicine and the availability of in person appointments. The patient expressed  understanding and agreed to proceed.    THERAPIST PROGRESS NOTE  Session Time: 4-5 pm  Participation Level: Active  Behavioral Response: CasualAlert/anxious  Type of Therapy: Individual Therapy  Treatment Goals addressed: Meet with clinician weekly for therapy to monitor for progress towards goals and address any barriers to success; Reduce depression from average severity level of 6/10 down to a 4/10 in next 6 months by engaging in 1-2 positive coping skills daily as part of developing self-care routine; Reduce average anxiety level from 7/10 down to 5/10 in next 6 months by utilizing 1-2 relaxation skills/grounding skills per day, such as mindful breathing, progressive muscle relaxation, positive visualizations.   ProgressTowards Goals: Progressing   Summary: [name] is a 53 y.o. female who presents for today's session on time and was alert, oriented x5, with no evidence or self-report of SI/HI or A/V H.  Patient reported ongoing compliance with medication and denied any use of alcohol or illicit substances. Clinician inquired about patient's current emotional ratings, as well as any significant changes in thoughts, feelings or behaviors since previous session. Patient reported scores of 5/10 for depression, 6/10 for anxiety, 2/10 for anger/irritability, and denied any panic attacks. I have been reading my books, Co-dependency and Walking on eggshells. Cln provided psychoeducation   on each of the books and how the books are helping her with her moods.     PLAN: books    Assessment and plan: Counselor will continue to meet with patient to address treatment plan goals. Patient will continue to follow recommendations of providers and implement skills learned in session.    Suicidal/Homicidal: Nowithout intent/plan   Plan: life with alcoholic, low self esteem, co-dependency  Diagnosis: Anxiety due to a medical condition, Depression  Collaboration of Care: Continue to  work with providers  Patient/Guardian was advised Release of Information must be obtained prior to any record release in order to collaborate their care with an outside provider. Patient/Guardian was advised if they have not already done so to contact the registration department to sign all necessary forms in order for us  to release information regarding their care.   Consent: Patient/Guardian gives verbal consent for treatment and assignment of benefits for services provided during this visit. Patient/Guardian expressed understanding and agreed to proceed.   Renee Hahn S, LCAS-A 06/13/24

## 2024-06-18 ENCOUNTER — Encounter: Payer: No Typology Code available for payment source | Admitting: Nurse Practitioner

## 2024-06-18 ENCOUNTER — Ambulatory Visit (INDEPENDENT_AMBULATORY_CARE_PROVIDER_SITE_OTHER): Admitting: Licensed Clinical Social Worker

## 2024-06-18 DIAGNOSIS — F32A Depression, unspecified: Secondary | ICD-10-CM | POA: Diagnosis not present

## 2024-06-18 DIAGNOSIS — F064 Anxiety disorder due to known physiological condition: Secondary | ICD-10-CM | POA: Diagnosis not present

## 2024-06-19 ENCOUNTER — Encounter

## 2024-06-20 ENCOUNTER — Ambulatory Visit

## 2024-06-20 DIAGNOSIS — Z8673 Personal history of transient ischemic attack (TIA), and cerebral infarction without residual deficits: Secondary | ICD-10-CM | POA: Diagnosis not present

## 2024-06-21 ENCOUNTER — Encounter

## 2024-06-21 ENCOUNTER — Encounter (HOSPITAL_COMMUNITY): Payer: Self-pay | Admitting: Licensed Clinical Social Worker

## 2024-06-21 LAB — CUP PACEART REMOTE DEVICE CHECK
Date Time Interrogation Session: 20251216233914
Implantable Pulse Generator Implant Date: 20250404

## 2024-06-21 NOTE — Progress Notes (Signed)
 Remote PPM Transmission

## 2024-06-21 NOTE — Progress Notes (Signed)
 Virtual Visit via Video Note   I connected with Renee Hahn on 06/18/24  2-3pm EDT by a video enabled telemedicine application and verified that I am speaking with the correct person using two identifiers.   Location: Patient: Home Provider: Home Office   I discussed the limitations of evaluation and management by telemedicine and the availability of in person appointments. The patient expressed  understanding and agreed to proceed.    THERAPIST PROGRESS NOTE  Session Time: 2-3 pm  Participation Level: Active  Behavioral Response: CasualAlert/anxious  Type of Therapy: Individual Therapy  Treatment Goals addressed: Meet with clinician weekly for therapy to monitor for progress towards goals and address any barriers to success; Reduce depression from average severity level of 6/10 down to a 4/10 in next 6 months by engaging in 1-2 positive coping skills daily as part of developing self-care routine; Reduce average anxiety level from 7/10 down to 5/10 in next 6 months by utilizing 1-2 relaxation skills/grounding skills per day, such as mindful breathing, progressive muscle relaxation, positive visualizations.   ProgressTowards Goals: Progressing   Summary: Renee Hahn is a 53 y.o. female who presents for today's session on time and was alert, oriented x5, with no evidence or self-report of SI/HI or A/V H.  Patient reported ongoing compliance with medication and denied any use of alcohol or illicit substances. Clinician inquired about patient's current emotional ratings, as well as any significant changes in thoughts, feelings or behaviors since previous session. Patient reported scores of 5/10 for depression, 6/10 for anxiety, 2/10 for anger/irritability, and denied any panic attacks. Pt discussed his current stressors: work, trauma from her marriage to an alcoholic/addict, Cln provided psychoeducation on symptoms of ptsd and coping skills to use.   SABRA     PLAN: books    Assessment and plan: Counselor will continue to meet with patient to address treatment plan goals. Patient will continue to follow recommendations of providers and implement skills learned in session.    Suicidal/Homicidal: Nowithout intent/plan   Plan: life with alcoholic, low self esteem, co-dependency  Diagnosis: Anxiety due to a medical condition, Depression  Collaboration of Care: Continue to  work with providers  Patient/Guardian was advised Release of Information must be obtained prior to any record release in order to collaborate their care with an outside provider. Patient/Guardian was advised if they have not already done so to contact the registration department to sign all necessary forms in order for us  to release information regarding their care.   Consent: Patient/Guardian gives verbal consent for treatment and assignment of benefits for services provided during this visit. Patient/Guardian expressed understanding and agreed to proceed.   Jafeth Mustin S, LCAS-A 06/18/24

## 2024-06-25 ENCOUNTER — Ambulatory Visit (HOSPITAL_COMMUNITY): Admitting: Licensed Clinical Social Worker

## 2024-06-25 DIAGNOSIS — F064 Anxiety disorder due to known physiological condition: Secondary | ICD-10-CM

## 2024-06-25 DIAGNOSIS — F32A Depression, unspecified: Secondary | ICD-10-CM | POA: Diagnosis not present

## 2024-06-25 DIAGNOSIS — F321 Major depressive disorder, single episode, moderate: Secondary | ICD-10-CM

## 2024-06-29 ENCOUNTER — Ambulatory Visit: Payer: Self-pay | Admitting: Student in an Organized Health Care Education/Training Program

## 2024-06-30 ENCOUNTER — Encounter (HOSPITAL_COMMUNITY): Payer: Self-pay | Admitting: Licensed Clinical Social Worker

## 2024-06-30 NOTE — Progress Notes (Signed)
"                                                                                                                                                                                                                                                                                                                                                                                                                                                                                                                                                                                                                                                                                                                                                                                                  Virtual Visit via Video Note   I connected with Renee Hahn on 06/25/24  2-3pm EDT by a video enabled telemedicine application and verified that I am speaking with the correct person using two identifiers.   Location: Patient: Home Provider: Home Office   I discussed the limitations of evaluation and management by telemedicine and the availability of in person appointments. The patient expressed  understanding and agreed to proceed.    THERAPIST PROGRESS NOTE  Session Time: 2-3 pm  Participation Level: Active  Behavioral Response: CasualAlert/anxious  Type of Therapy: Individual Therapy  Treatment Goals addressed: Meet with clinician weekly for therapy to monitor for progress towards goals and address any barriers to success; Reduce depression from average severity level of 6/10 down to a 4/10 in next 6 months by engaging in 1-2 positive coping skills daily as part of developing self-care routine; Reduce average anxiety level from 7/10 down to 5/10 in next 6 months by utilizing 1-2 relaxation skills/grounding skills per day, such as mindful breathing, progressive muscle relaxation, positive visualizations.   ProgressTowards Goals: Progressing   Summary: Renee Hahn is a 53 y.o. female who presents for today's session on time and was alert, oriented x5, with no evidence or self-report of SI/HI or A/V H.  Patient reported ongoing compliance with medication and denied any use of alcohol or illicit substances. Clinician inquired about patient's current emotional ratings, as well as any significant changes in thoughts, feelings or behaviors since previous session. Patient reported scores of 5/10 for depression, 6/10 for anxiety, 2/10 for anger/irritability, and denied any panic attacks. Pt discussed her upcoming holidays,, little family to celebrate with, missing her parents, early childhood memories, increase in anxiety and depression during the holidays. Cln and pt reviewed effective coping skills to use during the holidays.  SABRA     PLAN: books    Assessment and plan: Counselor will continue to meet with patient to address treatment plan goals. Patient will continue to follow recommendations of providers and implement skills learned in session.    Suicidal/Homicidal: Nowithout intent/plan   Plan: life with alcoholic, low self esteem, co-dependency  Diagnosis: Anxiety  due to a medical condition, Depression  Collaboration of Care: Continue to work with providers  Patient/Guardian was advised Release of Information must be obtained prior to any record release in order to collaborate their care with an outside provider. Patient/Guardian was advised if they have not already done so to contact the registration department to sign all necessary forms in order for us  to release information regarding their care.   Consent: Patient/Guardian gives verbal consent for treatment and assignment of benefits for services provided during this visit. Patient/Guardian expressed understanding and agreed to proceed.   Tarique Loveall S, LCAS-A 06/25/24    "

## 2024-07-02 ENCOUNTER — Ambulatory Visit (INDEPENDENT_AMBULATORY_CARE_PROVIDER_SITE_OTHER): Admitting: Licensed Clinical Social Worker

## 2024-07-02 DIAGNOSIS — F321 Major depressive disorder, single episode, moderate: Secondary | ICD-10-CM

## 2024-07-02 DIAGNOSIS — F064 Anxiety disorder due to known physiological condition: Secondary | ICD-10-CM | POA: Diagnosis not present

## 2024-07-02 DIAGNOSIS — F32A Depression, unspecified: Secondary | ICD-10-CM | POA: Diagnosis not present

## 2024-07-03 ENCOUNTER — Encounter (HOSPITAL_COMMUNITY): Payer: Self-pay | Admitting: Licensed Clinical Social Worker

## 2024-07-03 NOTE — Progress Notes (Signed)
"                                                                                                                                                                                                                                                                                                                                                                                                                                                                                                                                                                                                                                                                                                                                                                                                  Virtual Visit via Video Note   I connected with [name] on 07/02/24  3-4pm EDT by a video enabled telemedicine application and verified that I am speaking with the correct person using two identifiers.   Location: Patient: Home Provider: Home Office   I discussed the limitations of evaluation and management by telemedicine and the availability of in person appointments. The patient expressed  understanding and agreed to proceed.    THERAPIST PROGRESS NOTE  Session Time: 3-4 pm  Participation Level: Active  Behavioral Response: CasualAlert/anxious  Type of Therapy: Individual Therapy  Treatment Goals addressed: Meet with clinician weekly for therapy to monitor for progress towards goals and address any barriers to success; Reduce depression from average severity level of 6/10 down to a 4/10 in next 6 months by engaging in 1-2 positive coping skills daily as part of developing self-care routine; Reduce average anxiety level from 7/10 down to 5/10 in next 6 months by utilizing 1-2 relaxation skills/grounding skills per day, such as mindful breathing, progressive muscle relaxation, positive visualizations.   ProgressTowards Goals: Progressing   Summary: Renee Hahn is a 53 y.o. female who presents for today's session on time and was alert, oriented x5, with no evidence or self-report of SI/HI or A/V H.  Patient reported ongoing compliance with medication and denied any use of alcohol or illicit substances. Clinician inquired about patient's current emotional ratings, as well as any significant changes in thoughts, feelings or behaviors since previous session. Patient reported scores of 5/10 for depression, 6/10 for anxiety, 2/10 for anger/irritability, and denied any panic attacks. Pt discussed her holidays,, and revealed her stop son has admitted to a drinking problem. Cln provided psychoeducation on alcoholism, treatment and recovery. Cln gave pt information about tx facilities. Cln also provided psychoeducation on al-a-non.     PLAN: books    Assessment and plan: Counselor will continue to meet with patient to address treatment plan goals. Patient will continue to follow recommendations of providers and implement skills learned in session.    Suicidal/Homicidal: Nowithout intent/plan   Plan: life with alcoholic, low self esteem, co-dependency  Diagnosis: Anxiety  due to a medical condition, Depression  Collaboration of Care: Continue to work with providers  Patient/Guardian was advised Release of Information must be obtained prior to any record release in order to collaborate their care with an outside provider. Patient/Guardian was advised if they have not already done so to contact the registration department to sign all necessary forms in order for us  to release information regarding their care.   Consent: Patient/Guardian gives verbal consent for treatment and assignment of benefits for services provided during this visit. Patient/Guardian expressed understanding and agreed to proceed.   Alegria Dominique S, LCAS-A 07/02/24    "

## 2024-07-09 ENCOUNTER — Encounter (HOSPITAL_COMMUNITY): Payer: Self-pay | Admitting: Licensed Clinical Social Worker

## 2024-07-09 ENCOUNTER — Ambulatory Visit (INDEPENDENT_AMBULATORY_CARE_PROVIDER_SITE_OTHER): Admitting: Licensed Clinical Social Worker

## 2024-07-09 DIAGNOSIS — F32A Depression, unspecified: Secondary | ICD-10-CM

## 2024-07-09 DIAGNOSIS — F064 Anxiety disorder due to known physiological condition: Secondary | ICD-10-CM | POA: Diagnosis not present

## 2024-07-09 DIAGNOSIS — F321 Major depressive disorder, single episode, moderate: Secondary | ICD-10-CM

## 2024-07-09 NOTE — Progress Notes (Signed)
"                                                                                                                                                                                                                                                                                                                                                                                                                                                                                                                                                                                                                                                                                                                                                                                                  Virtual Visit via Video Note   I connected with [name] on 07/09/24  3-4pm EDT by a video enabled telemedicine application and verified that I am speaking with the correct person using two identifiers.   Location: Patient: Home Provider: Home Office   I discussed the limitations of evaluation and management by telemedicine and the availability of in person appointments. The patient expressed  understanding and agreed to proceed.    THERAPIST PROGRESS NOTE  Session Time: 3-4 pm  Participation Level: Active  Behavioral Response: CasualAlert/anxious  Type of Therapy: Individual Therapy  Treatment Goals addressed: Meet with clinician weekly for therapy to monitor for progress towards goals and address any barriers to success; Reduce depression from average severity level of 6/10 down to a 4/10 in next 6 months by engaging in 1-2 positive coping skills daily as part of developing self-care routine; Reduce average anxiety level from 7/10 down to 5/10 in next 6 months by utilizing 1-2 relaxation skills/grounding skills per day, such as mindful breathing, progressive muscle relaxation, positive visualizations.   ProgressTowards Goals: Progressing   Summary: Renee Hahn is a 54 y.o. female who presents for today's session on time and was alert, oriented x5, with no evidence or self-report of SI/HI or A/V H.  Patient reported ongoing compliance with medication and denied any use of alcohol or illicit substances. Clinician inquired about patient's current emotional ratings, as well as any significant changes in thoughts, feelings or behaviors since previous session. Patient reported scores of 5/10 for depression, 6/10 for anxiety, 2/10 for anger/irritability, and denied any panic attacks. 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 Cln provided psychoeducation on compartmentalization.       PLAN: books    Assessment and plan: Counselor will continue to meet with patient to address treatment plan goals. Patient will continue to follow recommendations of providers and implement skills learned in session.    Suicidal/Homicidal: Nowithout intent/plan   Plan: life with alcoholic, low self esteem, co-dependency  Diagnosis: Anxiety due to a medical  condition, Depression  Collaboration of Care: Continue to work with providers  Patient/Guardian was advised Release of Information must be obtained prior to any record release in order to collaborate their care with an outside provider. Patient/Guardian was advised if they have not already done so to contact the registration department to sign all necessary forms in order for us  to release information regarding their care.   Consent: Patient/Guardian gives verbal consent for treatment and assignment of benefits for services provided during this visit. Patient/Guardian expressed understanding and agreed to proceed.   [name], LCAS-A 07/09/24    "

## 2024-07-13 ENCOUNTER — Encounter

## 2024-07-16 ENCOUNTER — Ambulatory Visit (INDEPENDENT_AMBULATORY_CARE_PROVIDER_SITE_OTHER): Admitting: Licensed Clinical Social Worker

## 2024-07-16 DIAGNOSIS — F32A Depression, unspecified: Secondary | ICD-10-CM | POA: Diagnosis not present

## 2024-07-16 DIAGNOSIS — F064 Anxiety disorder due to known physiological condition: Secondary | ICD-10-CM | POA: Diagnosis not present

## 2024-07-16 DIAGNOSIS — F321 Major depressive disorder, single episode, moderate: Secondary | ICD-10-CM

## 2024-07-20 ENCOUNTER — Encounter

## 2024-07-21 ENCOUNTER — Ambulatory Visit: Attending: Student in an Organized Health Care Education/Training Program

## 2024-07-21 DIAGNOSIS — Z8673 Personal history of transient ischemic attack (TIA), and cerebral infarction without residual deficits: Secondary | ICD-10-CM

## 2024-07-23 ENCOUNTER — Ambulatory Visit (HOSPITAL_COMMUNITY): Admitting: Licensed Clinical Social Worker

## 2024-07-23 ENCOUNTER — Encounter (HOSPITAL_COMMUNITY): Payer: Self-pay

## 2024-07-23 LAB — CUP PACEART REMOTE DEVICE CHECK
Date Time Interrogation Session: 20260116232701
Implantable Pulse Generator Implant Date: 20250404

## 2024-07-26 NOTE — Progress Notes (Signed)
 Remote Loop Recorder Transmission

## 2024-07-29 ENCOUNTER — Encounter (HOSPITAL_COMMUNITY): Payer: Self-pay | Admitting: Licensed Clinical Social Worker

## 2024-07-29 NOTE — Progress Notes (Signed)
"                                                                                                                                                                                                                                                                                                                                                                                                                                                                                                                                                                                                                                                                                                                                                                                                  Virtual Visit via Video Note   I connected with Renee Hahn on 07/16/24  3-4pm EDT by a video enabled telemedicine application and verified that I am speaking with the correct person using two identifiers.   Location: Patient: Home Provider: Home Office   I discussed the limitations of evaluation and management by telemedicine and the availability of in person appointments. The patient expressed  understanding and agreed to proceed.    THERAPIST PROGRESS NOTE  Session Time: 3-4 pm  Participation Level: Active  Behavioral Response: CasualAlert/anxious  Type of Therapy: Individual Therapy  Treatment Goals addressed: Meet with clinician weekly for therapy to monitor for progress towards goals and address any barriers to success; Reduce depression from average severity level of 6/10 down to a 4/10 in next 6 months by engaging in 1-2 positive coping skills daily as part of developing self-care routine; Reduce average anxiety level from 7/10 down to 5/10 in next 6 months by utilizing 1-2 relaxation skills/grounding skills per day, such as mindful breathing, progressive muscle relaxation, positive visualizations.   ProgressTowards Goals: Progressing   Summary: Renee Hahn is a 54 y.o. female who presents for today's session on time and was alert, oriented x5, with no evidence or self-report of SI/HI or A/V H.  Patient reported ongoing compliance with medication and denied any use of alcohol or illicit substances. Clinician inquired about patient's current emotional ratings, as well as any significant changes in thoughts, feelings or behaviors since previous session. Patient reported scores of 4/10 for depression, 5/10 for anxiety, 2/10 for anger/irritability, and denied any panic attacks. This week is my husband's death date. I have mixed emotions about our marriage, his addict ion and his death. Cln asked open ended questions.Clinician utilized CBT to process concerns, and challenges.      PLAN: books    Assessment and plan: Counselor will continue to meet with patient to address treatment plan goals. Patient will continue to follow recommendations of providers and implement skills learned in session.    Suicidal/Homicidal: Nowithout intent/plan   Plan: life with alcoholic, low self esteem, co-dependency  Diagnosis: Anxiety due to a medical condition,  Depression  Collaboration of Care: Continue to work with providers  Patient/Guardian was advised Release of Information must be obtained prior to any record release in order to collaborate their care with an outside provider. Patient/Guardian was advised if they have not already done so to contact the registration department to sign all necessary forms in order for us  to release information regarding their care.   Consent: Patient/Guardian gives verbal consent for treatment and assignment of benefits for services provided during this visit. Patient/Guardian expressed understanding and agreed to proceed.   Cira CANDIE Standing, MS, LCAS 07/16/24    "

## 2024-07-30 ENCOUNTER — Ambulatory Visit (HOSPITAL_COMMUNITY): Admitting: Licensed Clinical Social Worker

## 2024-07-31 ENCOUNTER — Institutional Professional Consult (permissible substitution): Admitting: Neurology

## 2024-07-31 ENCOUNTER — Telehealth: Payer: Self-pay | Admitting: Neurology

## 2024-07-31 ENCOUNTER — Ambulatory Visit (INDEPENDENT_AMBULATORY_CARE_PROVIDER_SITE_OTHER): Admitting: Licensed Clinical Social Worker

## 2024-07-31 ENCOUNTER — Encounter: Admitting: Nurse Practitioner

## 2024-07-31 DIAGNOSIS — F064 Anxiety disorder due to known physiological condition: Secondary | ICD-10-CM | POA: Diagnosis not present

## 2024-07-31 DIAGNOSIS — F321 Major depressive disorder, single episode, moderate: Secondary | ICD-10-CM | POA: Diagnosis not present

## 2024-07-31 NOTE — Telephone Encounter (Signed)
 Patient called to reschedule appointment due bad weather, icy roads.

## 2024-08-03 ENCOUNTER — Encounter (HOSPITAL_COMMUNITY): Payer: Self-pay | Admitting: Licensed Clinical Social Worker

## 2024-08-03 NOTE — Progress Notes (Signed)
"                                                                                                                                                                                                                                                                                                                                                                                                                                                                                                                                                                                                                                                                                                                                                                                                  Virtual Visit via Video Note   I connected with Renee Hahn on 07/31/24  3-4pm EDT by a video enabled telemedicine application and verified that I am speaking with the correct person using two identifiers.   Location: Patient: Home Provider: Home Office   I discussed the limitations of evaluation and management by telemedicine and the availability of in person appointments. The patient expressed  understanding and agreed to proceed.    THERAPIST PROGRESS NOTE  Session Time: 3-4 pm  Participation Level: Active  Behavioral Response: CasualAlert/anxious  Type of Therapy: Individual Therapy  Treatment Goals addressed: Meet with clinician weekly for therapy to monitor for progress towards goals and address any barriers to success; Reduce depression from average severity level of 6/10 down to a 4/10 in next 6 months by engaging in 1-2 positive coping skills daily as part of developing self-care routine; Reduce average anxiety level from 7/10 down to 5/10 in next 6 months by utilizing 1-2 relaxation skills/grounding skills per day, such as mindful breathing, progressive muscle relaxation, positive visualizations.   ProgressTowards Goals: Progressing   Summary: Renee Hahn is a 54 y.o. female who presents for today's session on time and was alert, oriented x5, with no evidence or self-report of SI/HI or A/V H.  Patient reported ongoing compliance with medication and denied any use of alcohol or illicit substances. Clinician inquired about patient's current emotional ratings, as well as any significant changes in thoughts, feelings or behaviors since previous session. Patient reported scores of 4/10 for depression, 5/10 for anxiety, 2/10 for anger/irritability, and denied any panic attacks.  My ratings continue to be high due to having unhealthy thoughts about my marriage to an addict/alcoholic and his death. I can't forgive myself for my choices. Cln used CBT to assist pt with forgiving herself. Cln reviewed Co-dependency no more book with pt.    PLAN: books    Assessment and plan: Counselor will continue to meet with patient to address treatment plan goals. Patient will continue to follow recommendations of providers and implement skills learned in session.    Suicidal/Homicidal: Nowithout intent/plan   Plan: life with alcoholic, low self esteem, co-dependency  Diagnosis:  Anxiety due to a medical condition, Depression  Collaboration of Care: Continue to work with providers  Patient/Guardian was advised Release of Information must be obtained prior to any record release in order to collaborate their care with an outside provider. Patient/Guardian was advised if they have not already done so to contact the registration department to sign all necessary forms in order for us  to release information regarding their care.   Consent: Patient/Guardian gives verbal consent for treatment and assignment of benefits for services provided during this visit. Patient/Guardian expressed understanding and agreed to proceed.   Renee CANDIE Standing, MS, LCAS 07/31/24    "

## 2024-08-04 ENCOUNTER — Ambulatory Visit: Payer: Self-pay | Admitting: Student in an Organized Health Care Education/Training Program

## 2024-08-06 ENCOUNTER — Ambulatory Visit (HOSPITAL_COMMUNITY): Admitting: Licensed Clinical Social Worker

## 2024-08-06 DIAGNOSIS — F064 Anxiety disorder due to known physiological condition: Secondary | ICD-10-CM

## 2024-08-06 DIAGNOSIS — F321 Major depressive disorder, single episode, moderate: Secondary | ICD-10-CM

## 2024-08-07 ENCOUNTER — Encounter (HOSPITAL_COMMUNITY): Payer: Self-pay | Admitting: Licensed Clinical Social Worker

## 2024-08-07 NOTE — Progress Notes (Signed)
"                                                                                                                                                                                                                                                                                                                                                                                                                                                                                                                                                                                                                                                                                                                                                                                                  Virtual Visit via Video Note   I connected with Renee Hahn on 08/06/24  3-4pm EDT by a video enabled telemedicine application and verified that I am speaking with the correct person using two identifiers.   Location: Patient: Home Provider: Home Office   I discussed the limitations of evaluation and management by telemedicine and the availability of in person appointments. The patient expressed  understanding and agreed to proceed.    THERAPIST PROGRESS NOTE  Session Time: 3-4 pm  Participation Level: Active  Behavioral Response: CasualAlert/anxious  Type of Therapy: Individual Therapy  Treatment Goals addressed: Meet with clinician weekly for therapy to monitor for progress towards goals and address any barriers to success; Reduce depression from average severity level of 6/10 down to a 4/10 in next 6 months by engaging in 1-2 positive coping skills daily as part of developing self-care routine; Reduce average anxiety level from 7/10 down to 5/10 in next 6 months by utilizing 1-2 relaxation skills/grounding skills per day, such as mindful breathing, progressive muscle relaxation, positive visualizations.   ProgressTowards Goals: Progressing   Summary: Renee Hahn is a 54 y.o. female who presents for today's session on time and was alert, oriented x5, with no evidence or self-report of SI/HI or A/V H.  Patient reported ongoing compliance with medication and denied any use of alcohol or illicit substances. Clinician inquired about patient's current emotional ratings, as well as any significant changes in thoughts, feelings or behaviors since previous session. Patient reported scores of 5/10 for depression, 5/10 for anxiety, 2/10 for anger/irritability, and denied any panic attacks.  It is now February and I am becoming increasingly depressed due to death dates of my husband and father both passing in Feb. Cln provided psychoeducation on the cycle of grief. Healing grief is not linear and new grief feelings can happen at any time. Cln asked open ended questions.    PLAN: books    Assessment and plan: Counselor will continue to meet with patient to address treatment plan goals. Patient will continue to follow recommendations of providers and implement skills learned in session.    Suicidal/Homicidal: Nowithout intent/plan   Plan: life with alcoholic, low self esteem,  co-dependency  Diagnosis: Anxiety due to a medical condition, Depression  Collaboration of Care: Continue to work with providers  Patient/Guardian was advised Release of Information must be obtained prior to any record release in order to collaborate their care with an outside provider. Patient/Guardian was advised if they have not already done so to contact the registration department to sign all necessary forms in order for us  to release information regarding their care.   Consent: Patient/Guardian gives verbal consent for treatment and assignment of benefits for services provided during this visit. Patient/Guardian expressed understanding and agreed to proceed.   Cira CANDIE Standing, MS, LCAS 08/06/24    "

## 2024-08-13 ENCOUNTER — Ambulatory Visit (HOSPITAL_COMMUNITY): Admitting: Licensed Clinical Social Worker

## 2024-08-20 ENCOUNTER — Encounter

## 2024-08-20 ENCOUNTER — Ambulatory Visit (HOSPITAL_COMMUNITY): Admitting: Licensed Clinical Social Worker

## 2024-08-21 ENCOUNTER — Ambulatory Visit

## 2024-08-23 ENCOUNTER — Institutional Professional Consult (permissible substitution): Admitting: Neurology

## 2024-08-27 ENCOUNTER — Ambulatory Visit (HOSPITAL_COMMUNITY): Admitting: Licensed Clinical Social Worker

## 2024-09-20 ENCOUNTER — Encounter

## 2024-09-21 ENCOUNTER — Ambulatory Visit

## 2024-10-26 ENCOUNTER — Encounter: Admitting: Nurse Practitioner

## 2025-06-10 ENCOUNTER — Ambulatory Visit: Admitting: Neurology
